# Patient Record
Sex: Male | Born: 1935 | Race: White | Hispanic: No | Smoking: Former smoker
Health system: Southern US, Community
[De-identification: ages and names within clinical notes are randomized; demographics above are authoritative.]

## PROBLEM LIST (undated history)

## (undated) DIAGNOSIS — I4891 Unspecified atrial fibrillation: Secondary | ICD-10-CM

## (undated) DIAGNOSIS — I10 Essential (primary) hypertension: Secondary | ICD-10-CM

## (undated) DIAGNOSIS — C4490 Unspecified malignant neoplasm of skin, unspecified: Secondary | ICD-10-CM

## (undated) HISTORY — DX: Unspecified malignant neoplasm of skin, unspecified: C44.90

## (undated) HISTORY — DX: Unspecified atrial fibrillation: I48.91

## (undated) HISTORY — PX: OTHER SURGICAL HISTORY: SHX169

## (undated) HISTORY — DX: Essential (primary) hypertension: I10

---

## 2011-10-16 DIAGNOSIS — R109 Unspecified abdominal pain: Secondary | ICD-10-CM | POA: Diagnosis not present

## 2011-10-16 DIAGNOSIS — J069 Acute upper respiratory infection, unspecified: Secondary | ICD-10-CM | POA: Diagnosis not present

## 2011-10-22 DIAGNOSIS — R1012 Left upper quadrant pain: Secondary | ICD-10-CM | POA: Diagnosis not present

## 2011-11-05 DIAGNOSIS — R1012 Left upper quadrant pain: Secondary | ICD-10-CM | POA: Diagnosis not present

## 2011-11-05 DIAGNOSIS — R109 Unspecified abdominal pain: Secondary | ICD-10-CM | POA: Diagnosis not present

## 2011-11-11 DIAGNOSIS — R1012 Left upper quadrant pain: Secondary | ICD-10-CM | POA: Diagnosis not present

## 2011-12-01 DIAGNOSIS — R1012 Left upper quadrant pain: Secondary | ICD-10-CM | POA: Diagnosis not present

## 2012-01-06 DIAGNOSIS — I4430 Unspecified atrioventricular block: Secondary | ICD-10-CM | POA: Diagnosis not present

## 2012-01-06 DIAGNOSIS — R609 Edema, unspecified: Secondary | ICD-10-CM | POA: Diagnosis not present

## 2012-01-06 DIAGNOSIS — I10 Essential (primary) hypertension: Secondary | ICD-10-CM | POA: Diagnosis not present

## 2012-01-06 DIAGNOSIS — I4891 Unspecified atrial fibrillation: Secondary | ICD-10-CM | POA: Diagnosis not present

## 2012-01-06 DIAGNOSIS — R42 Dizziness and giddiness: Secondary | ICD-10-CM | POA: Diagnosis not present

## 2012-01-06 DIAGNOSIS — I2510 Atherosclerotic heart disease of native coronary artery without angina pectoris: Secondary | ICD-10-CM | POA: Diagnosis not present

## 2012-01-06 DIAGNOSIS — E785 Hyperlipidemia, unspecified: Secondary | ICD-10-CM | POA: Diagnosis not present

## 2012-01-25 DIAGNOSIS — H908 Mixed conductive and sensorineural hearing loss, unspecified: Secondary | ICD-10-CM | POA: Diagnosis not present

## 2012-02-23 DIAGNOSIS — I4891 Unspecified atrial fibrillation: Secondary | ICD-10-CM | POA: Diagnosis not present

## 2012-03-30 DIAGNOSIS — I4891 Unspecified atrial fibrillation: Secondary | ICD-10-CM | POA: Diagnosis not present

## 2012-04-28 DIAGNOSIS — I4891 Unspecified atrial fibrillation: Secondary | ICD-10-CM | POA: Diagnosis not present

## 2012-05-03 DIAGNOSIS — I10 Essential (primary) hypertension: Secondary | ICD-10-CM | POA: Diagnosis not present

## 2012-05-03 DIAGNOSIS — I4891 Unspecified atrial fibrillation: Secondary | ICD-10-CM | POA: Diagnosis not present

## 2012-05-18 DIAGNOSIS — I4891 Unspecified atrial fibrillation: Secondary | ICD-10-CM | POA: Diagnosis not present

## 2012-06-02 DIAGNOSIS — Z961 Presence of intraocular lens: Secondary | ICD-10-CM | POA: Diagnosis not present

## 2012-06-02 DIAGNOSIS — H35369 Drusen (degenerative) of macula, unspecified eye: Secondary | ICD-10-CM | POA: Diagnosis not present

## 2012-06-02 DIAGNOSIS — H353190 Nonexudative age-related macular degeneration, unspecified eye, stage unspecified: Secondary | ICD-10-CM | POA: Diagnosis not present

## 2012-06-13 DIAGNOSIS — I4891 Unspecified atrial fibrillation: Secondary | ICD-10-CM | POA: Diagnosis not present

## 2012-07-14 DIAGNOSIS — I10 Essential (primary) hypertension: Secondary | ICD-10-CM | POA: Diagnosis not present

## 2012-07-14 DIAGNOSIS — I2510 Atherosclerotic heart disease of native coronary artery without angina pectoris: Secondary | ICD-10-CM | POA: Diagnosis not present

## 2012-07-14 DIAGNOSIS — R42 Dizziness and giddiness: Secondary | ICD-10-CM | POA: Diagnosis not present

## 2012-07-14 DIAGNOSIS — I4430 Unspecified atrioventricular block: Secondary | ICD-10-CM | POA: Diagnosis not present

## 2012-07-14 DIAGNOSIS — E785 Hyperlipidemia, unspecified: Secondary | ICD-10-CM | POA: Diagnosis not present

## 2012-07-14 DIAGNOSIS — R609 Edema, unspecified: Secondary | ICD-10-CM | POA: Diagnosis not present

## 2012-07-14 DIAGNOSIS — I4891 Unspecified atrial fibrillation: Secondary | ICD-10-CM | POA: Diagnosis not present

## 2012-08-17 DIAGNOSIS — I4891 Unspecified atrial fibrillation: Secondary | ICD-10-CM | POA: Diagnosis not present

## 2012-09-13 DIAGNOSIS — I4891 Unspecified atrial fibrillation: Secondary | ICD-10-CM | POA: Diagnosis not present

## 2012-10-12 DIAGNOSIS — I4891 Unspecified atrial fibrillation: Secondary | ICD-10-CM | POA: Diagnosis not present

## 2012-11-16 DIAGNOSIS — E785 Hyperlipidemia, unspecified: Secondary | ICD-10-CM | POA: Diagnosis not present

## 2012-11-16 DIAGNOSIS — I4430 Unspecified atrioventricular block: Secondary | ICD-10-CM | POA: Diagnosis not present

## 2012-11-16 DIAGNOSIS — R609 Edema, unspecified: Secondary | ICD-10-CM | POA: Diagnosis not present

## 2012-11-16 DIAGNOSIS — I2510 Atherosclerotic heart disease of native coronary artery without angina pectoris: Secondary | ICD-10-CM | POA: Diagnosis not present

## 2012-11-16 DIAGNOSIS — I4891 Unspecified atrial fibrillation: Secondary | ICD-10-CM | POA: Diagnosis not present

## 2012-11-16 DIAGNOSIS — I10 Essential (primary) hypertension: Secondary | ICD-10-CM | POA: Diagnosis not present

## 2012-11-16 DIAGNOSIS — R42 Dizziness and giddiness: Secondary | ICD-10-CM | POA: Diagnosis not present

## 2012-12-14 DIAGNOSIS — I4891 Unspecified atrial fibrillation: Secondary | ICD-10-CM | POA: Diagnosis not present

## 2013-01-18 DIAGNOSIS — I4891 Unspecified atrial fibrillation: Secondary | ICD-10-CM | POA: Diagnosis not present

## 2013-01-27 DIAGNOSIS — I428 Other cardiomyopathies: Secondary | ICD-10-CM | POA: Diagnosis not present

## 2013-01-27 DIAGNOSIS — R0789 Other chest pain: Secondary | ICD-10-CM | POA: Diagnosis not present

## 2013-01-27 DIAGNOSIS — M109 Gout, unspecified: Secondary | ICD-10-CM | POA: Diagnosis not present

## 2013-01-27 DIAGNOSIS — I4891 Unspecified atrial fibrillation: Secondary | ICD-10-CM | POA: Diagnosis not present

## 2013-01-27 DIAGNOSIS — I10 Essential (primary) hypertension: Secondary | ICD-10-CM | POA: Diagnosis not present

## 2013-01-27 DIAGNOSIS — I509 Heart failure, unspecified: Secondary | ICD-10-CM | POA: Diagnosis not present

## 2013-01-27 DIAGNOSIS — I209 Angina pectoris, unspecified: Secondary | ICD-10-CM | POA: Diagnosis not present

## 2013-01-27 DIAGNOSIS — R079 Chest pain, unspecified: Secondary | ICD-10-CM | POA: Diagnosis not present

## 2013-01-28 DIAGNOSIS — I4891 Unspecified atrial fibrillation: Secondary | ICD-10-CM | POA: Diagnosis not present

## 2013-01-28 DIAGNOSIS — I10 Essential (primary) hypertension: Secondary | ICD-10-CM | POA: Diagnosis not present

## 2013-01-28 DIAGNOSIS — I209 Angina pectoris, unspecified: Secondary | ICD-10-CM | POA: Diagnosis not present

## 2013-01-28 DIAGNOSIS — R079 Chest pain, unspecified: Secondary | ICD-10-CM | POA: Diagnosis not present

## 2013-01-28 DIAGNOSIS — I509 Heart failure, unspecified: Secondary | ICD-10-CM | POA: Diagnosis not present

## 2013-01-28 DIAGNOSIS — I428 Other cardiomyopathies: Secondary | ICD-10-CM | POA: Diagnosis not present

## 2013-02-21 DIAGNOSIS — L821 Other seborrheic keratosis: Secondary | ICD-10-CM | POA: Diagnosis not present

## 2013-02-21 DIAGNOSIS — L570 Actinic keratosis: Secondary | ICD-10-CM | POA: Diagnosis not present

## 2013-02-21 DIAGNOSIS — L723 Sebaceous cyst: Secondary | ICD-10-CM | POA: Diagnosis not present

## 2013-02-22 DIAGNOSIS — I4891 Unspecified atrial fibrillation: Secondary | ICD-10-CM | POA: Diagnosis not present

## 2013-05-03 DIAGNOSIS — H04129 Dry eye syndrome of unspecified lacrimal gland: Secondary | ICD-10-CM | POA: Diagnosis not present

## 2013-05-03 DIAGNOSIS — Z961 Presence of intraocular lens: Secondary | ICD-10-CM | POA: Diagnosis not present

## 2013-06-08 DIAGNOSIS — D485 Neoplasm of uncertain behavior of skin: Secondary | ICD-10-CM | POA: Diagnosis not present

## 2013-06-08 DIAGNOSIS — L570 Actinic keratosis: Secondary | ICD-10-CM | POA: Diagnosis not present

## 2013-06-08 DIAGNOSIS — Z85828 Personal history of other malignant neoplasm of skin: Secondary | ICD-10-CM | POA: Diagnosis not present

## 2013-06-09 DIAGNOSIS — C44529 Squamous cell carcinoma of skin of other part of trunk: Secondary | ICD-10-CM | POA: Diagnosis not present

## 2013-06-15 DIAGNOSIS — I4891 Unspecified atrial fibrillation: Secondary | ICD-10-CM | POA: Diagnosis not present

## 2013-06-15 DIAGNOSIS — M159 Polyosteoarthritis, unspecified: Secondary | ICD-10-CM | POA: Diagnosis not present

## 2013-06-15 DIAGNOSIS — M1A00X0 Idiopathic chronic gout, unspecified site, without tophus (tophi): Secondary | ICD-10-CM | POA: Diagnosis not present

## 2013-06-15 DIAGNOSIS — I10 Essential (primary) hypertension: Secondary | ICD-10-CM | POA: Diagnosis not present

## 2013-06-15 DIAGNOSIS — Z7901 Long term (current) use of anticoagulants: Secondary | ICD-10-CM | POA: Diagnosis not present

## 2013-06-22 ENCOUNTER — Ambulatory Visit (INDEPENDENT_AMBULATORY_CARE_PROVIDER_SITE_OTHER): Payer: Medicare Other | Admitting: Cardiology

## 2013-06-22 ENCOUNTER — Encounter: Payer: Self-pay | Admitting: Cardiology

## 2013-06-22 VITALS — BP 128/62 | HR 84 | Ht 66.5 in | Wt 149.8 lb

## 2013-06-22 DIAGNOSIS — I4891 Unspecified atrial fibrillation: Secondary | ICD-10-CM

## 2013-06-22 DIAGNOSIS — I10 Essential (primary) hypertension: Secondary | ICD-10-CM

## 2013-06-22 LAB — CBC WITH DIFFERENTIAL/PLATELET
Basophils Absolute: 0 10*3/uL (ref 0.0–0.1)
Eosinophils Absolute: 0.2 10*3/uL (ref 0.0–0.7)
Lymphocytes Relative: 20.3 % (ref 12.0–46.0)
MCHC: 34 g/dL (ref 30.0–36.0)
Neutro Abs: 5 10*3/uL (ref 1.4–7.7)
Neutrophils Relative %: 69.9 % (ref 43.0–77.0)
Platelets: 136 10*3/uL — ABNORMAL LOW (ref 150.0–400.0)
RDW: 14.5 % (ref 11.5–14.6)

## 2013-06-22 LAB — BASIC METABOLIC PANEL
Chloride: 108 mEq/L (ref 96–112)
Creatinine, Ser: 1.2 mg/dL (ref 0.4–1.5)
Sodium: 140 mEq/L (ref 135–145)

## 2013-06-22 MED ORDER — DILTIAZEM HCL ER COATED BEADS 120 MG PO CP24: 120.0000 mg | ORAL_CAPSULE | Freq: Every day | ORAL | Status: DC

## 2013-06-22 NOTE — Assessment & Plan Note (Signed)
Patient appears to have permanent atrial fibrillation. We will obtain all records from [place]. Recent data suggest digoxin is not appropriate for rate control. I will discontinue this medication as well as Norvasc. Begin Cardizem CD 120 mg daily. In 2 weeks we will schedule a 24-hour Holter monitor to make sure that his rate is controlled. Continue Coumadin. Referred to the Coumadin clinic. Check hemoglobin and renal function. He has no history of valvular disease. Once I have reviewed outside records we will make a decision and probably switch from Coumadin to apixaban. Embolic risk factors of age greater than 61 and hypertension.

## 2013-06-22 NOTE — Patient Instructions (Signed)
Your physician recommends that you schedule a follow-up appointment in: 8-12 WEEKS WITH DR CRENSHAW  STOP AMLODIPINE  STOP DIGOXIN  3 DAYS AFTER STOPPING DIGOXIN START DILTIAZEM 120 MG ONCE DAILY  Your physician has recommended that you wear a 24 HOUR holter monitor. Holter monitors are medical devices that record the heart's electrical activity. Doctors most often use these monitors to diagnose arrhythmias. Arrhythmias are problems with the speed or rhythm of the heartbeat. The monitor is a small, portable device. You can wear one while you do your normal daily activities. This is usually used to diagnose what is causing palpitations/syncope (passing out).  Your physician recommends that you HAVE LAB WORK TODAY  NEEDS TO GET ESTABLISHED WITH THE CVRR CLINIC

## 2013-06-22 NOTE — Assessment & Plan Note (Signed)
Blood pressure controlled. Medicine changes as outlined under atrial fibrillation.

## 2013-06-22 NOTE — Progress Notes (Signed)
  HPI: 79 yo male for evaluation of atrial fibrillation. No records are available. He previously resided in [place]. He has been told of an irregular heart beats for approximately 6 years and has been maintained on Coumadin and digoxin. He denies dyspnea, chest pain, palpitations, syncope or bleeding.  Current Outpatient Prescriptions  Medication Sig Dispense Refill  . allopurinol (ZYLOPRIM) 100 MG tablet Take 100 mg by mouth daily.      Marland Kitchen amLODipine (NORVASC) 5 MG tablet Take 5 mg by mouth daily.      . benazepril (LOTENSIN) 40 MG tablet Take 40 mg by mouth daily.      . digoxin (LANOXIN) 0.125 MG tablet Take 0.125 mg by mouth daily.      Marland Kitchen warfarin (COUMADIN) 5 MG tablet Take 5 mg by mouth daily. 7.5mg  daily except Sun, Wed 5mg        No current facility-administered medications for this visit.    Allergies  Allergen Reactions  . Penicillins Rash     Past Medical History  Diagnosis Date  . Hypertension   . Atrial fibrillation   . Skin cancer     Past Surgical History  Procedure Laterality Date  . Cataracts      History   Social History  . Marital Status: Widowed    Spouse Name: N/A    Number of Children: 3  . Years of Education: N/A   Occupational History  . Not on file.   Social History Main Topics  . Smoking status: Former Smoker -- 3.00 packs/day    Types: Cigarettes    Start date: 06/22/1949    Quit date: 06/22/1969  . Smokeless tobacco: Not on file  . Alcohol Use: Yes     Comment: Occasional  . Drug Use: Not on file  . Sexual Activity: Not on file   Other Topics Concern  . Not on file   Social History Narrative  . No narrative on file    Family History  Problem Relation Age of Onset  . Heart disease      No family history    ROS: no fevers or chills, productive cough, hemoptysis, dysphasia, odynophagia, melena, hematochezia, dysuria, hematuria, rash, seizure activity, orthopnea, PND, pedal edema, claudication. Remaining systems are  negative.  Physical Exam:   Blood pressure 128/62, pulse 84, height 5' 6.5" (1.689 m), weight 149 lb 12.8 oz (67.949 kg).  General:  Well developed/well nourished in NAD Skin warm/dry Patient not depressed No peripheral clubbing Back-normal HEENT-normal/normal eyelids Neck supple/normal carotid upstroke bilaterally; no bruits; no JVD; no thyromegaly chest - CTA/ normal expansion CV - irregular/normal S1 and S2; no murmurs, rubs or gallops;  PMI nondisplaced Abdomen -NT/ND, no HSM, no mass, + bowel sounds, no bruit 2+ femoral pulses, no bruits Ext-no edema, chords, 2+ DP Neuro-grossly nonfocal  ECG atrial fibrillation at a rate of 84. Cannot rule out prior septal infarct. Nonspecific ST changes.

## 2013-06-23 DIAGNOSIS — C44529 Squamous cell carcinoma of skin of other part of trunk: Secondary | ICD-10-CM | POA: Diagnosis not present

## 2013-06-23 DIAGNOSIS — D045 Carcinoma in situ of skin of trunk: Secondary | ICD-10-CM | POA: Diagnosis not present

## 2013-06-23 DIAGNOSIS — L905 Scar conditions and fibrosis of skin: Secondary | ICD-10-CM | POA: Diagnosis not present

## 2013-06-28 ENCOUNTER — Telehealth: Payer: Self-pay | Admitting: Cardiology

## 2013-06-28 NOTE — Telephone Encounter (Signed)
New Problem:  Pt states he got a new bottle of meds in the mail. However, the pt states he thought his meds were going to be changed. Pt would like clarification on his meds.

## 2013-06-28 NOTE — Telephone Encounter (Signed)
Left message for pt to call.

## 2013-07-05 NOTE — Telephone Encounter (Signed)
Left message for dtr-n-law to call

## 2013-07-05 NOTE — Telephone Encounter (Signed)
F/u   [place] returning your call.

## 2013-07-05 NOTE — Telephone Encounter (Signed)
New Problem:  Pt's daughter n law states the pt's medicine recently changed. The pt was suppose to stop 2 of his meds and start taking one. However the pt has been taking all 3, according to his daughter n law. Please advise

## 2013-07-05 NOTE — Telephone Encounter (Signed)
Left message for pt to call.

## 2013-07-06 ENCOUNTER — Encounter: Payer: Self-pay | Admitting: Radiology

## 2013-07-06 ENCOUNTER — Ambulatory Visit (INDEPENDENT_AMBULATORY_CARE_PROVIDER_SITE_OTHER): Payer: Medicare Other | Admitting: General Practice

## 2013-07-06 ENCOUNTER — Ambulatory Visit (INDEPENDENT_AMBULATORY_CARE_PROVIDER_SITE_OTHER): Payer: Medicare Other | Admitting: Radiology

## 2013-07-06 DIAGNOSIS — I10 Essential (primary) hypertension: Secondary | ICD-10-CM

## 2013-07-06 DIAGNOSIS — I4891 Unspecified atrial fibrillation: Secondary | ICD-10-CM

## 2013-07-06 DIAGNOSIS — Z7901 Long term (current) use of anticoagulants: Secondary | ICD-10-CM | POA: Diagnosis not present

## 2013-07-06 LAB — POCT INR: INR: 2.1

## 2013-07-06 NOTE — Progress Notes (Signed)
Patient ID: Peter Moreno, male   DOB: [birth year], 79 y.o.   MRN: [id number] E Cardio 24 Holter Monitor applied

## 2013-07-06 NOTE — Telephone Encounter (Signed)
Pt and dtr-n-law here for coumadin appt. The pt got confused and has been taking the amlodipine, digoxin and cardizem all together. Per dr Jens Som last office note, pt instructed to stop the amlodipine and digoxin. He will continue the cardizem. Also dr Jens Som had reviewed the pt records from [place] and was wanting to switch the pt from warfarin to eliquis. Discussed with the pt, he is not interested in changing. He will continue with coumaqdin and his INR's will be followed here in our CVRR clinic. Dr Jens Som made aware.

## 2013-07-06 NOTE — Progress Notes (Signed)
Patient ID: Peter Moreno, male   DOB: [birth year], 79 y.o.   MRN: [id number] E Cardio 24 Holter monitor applied

## 2013-07-07 DIAGNOSIS — H6120 Impacted cerumen, unspecified ear: Secondary | ICD-10-CM | POA: Diagnosis not present

## 2013-07-14 ENCOUNTER — Telehealth: Payer: Self-pay | Admitting: *Deleted

## 2013-07-14 NOTE — Telephone Encounter (Signed)
Spoke with pt, aware monitor reviewed by dr Jens Som show atrial fib with controlled rate.

## 2013-08-03 ENCOUNTER — Ambulatory Visit (INDEPENDENT_AMBULATORY_CARE_PROVIDER_SITE_OTHER): Payer: Medicare Other | Admitting: General Practice

## 2013-08-03 DIAGNOSIS — Z7901 Long term (current) use of anticoagulants: Secondary | ICD-10-CM | POA: Diagnosis not present

## 2013-08-03 DIAGNOSIS — I4891 Unspecified atrial fibrillation: Secondary | ICD-10-CM | POA: Diagnosis not present

## 2013-09-07 ENCOUNTER — Ambulatory Visit (INDEPENDENT_AMBULATORY_CARE_PROVIDER_SITE_OTHER): Payer: Medicare Other | Admitting: General Practice

## 2013-09-07 DIAGNOSIS — I4891 Unspecified atrial fibrillation: Secondary | ICD-10-CM

## 2013-09-07 DIAGNOSIS — Z5181 Encounter for therapeutic drug level monitoring: Secondary | ICD-10-CM | POA: Diagnosis not present

## 2013-09-07 DIAGNOSIS — Z7901 Long term (current) use of anticoagulants: Secondary | ICD-10-CM

## 2013-09-18 ENCOUNTER — Encounter: Payer: Self-pay | Admitting: Cardiology

## 2013-09-19 ENCOUNTER — Encounter: Payer: Self-pay | Admitting: Cardiology

## 2013-09-19 ENCOUNTER — Encounter (INDEPENDENT_AMBULATORY_CARE_PROVIDER_SITE_OTHER): Payer: Self-pay

## 2013-09-19 ENCOUNTER — Ambulatory Visit (INDEPENDENT_AMBULATORY_CARE_PROVIDER_SITE_OTHER): Payer: Medicare Other | Admitting: Pharmacist

## 2013-09-19 ENCOUNTER — Ambulatory Visit (INDEPENDENT_AMBULATORY_CARE_PROVIDER_SITE_OTHER): Payer: Medicare Other | Admitting: Cardiology

## 2013-09-19 VITALS — BP 118/75 | HR 90 | Ht 68.0 in | Wt 152.0 lb

## 2013-09-19 DIAGNOSIS — I4891 Unspecified atrial fibrillation: Secondary | ICD-10-CM

## 2013-09-19 DIAGNOSIS — Z7901 Long term (current) use of anticoagulants: Secondary | ICD-10-CM

## 2013-09-19 MED ORDER — BENAZEPRIL HCL 20 MG PO TABS: 20.0000 mg | ORAL_TABLET | Freq: Every day | ORAL | Status: DC

## 2013-09-19 NOTE — Patient Instructions (Signed)
Please decrease your Lotensin to 20 mg a day, Continue all other medications as listed.  Follow up in 6 months with Dr Jens Som.  You will receive a letter in the mail 2 months before you are due.  Please call us when you receive this letter to schedule your follow up appointment.

## 2013-09-19 NOTE — Progress Notes (Signed)
      HPI: FU atrial fibrillation. Nuclear study in April of 2014 showed an ejection fraction of 84% and no ischemia or infarction. Echocardiogram in April of 2014 showed hyperdynamic LV function, biatrial enlargement, mild right ventricular enlargement, mild aortic insufficiency, mild to moderate mitral regurgitation and moderate tricuspid regurgitation. When I initially saw him in September of 2014 we discontinued digoxin and begin Cardizem. Followup holter monitor in October 2014 showed atrial fibrillation with controlled ventricular response. Since I saw him, the patient denies any dyspnea on exertion, orthopnea, PND, pedal edema, palpitations, syncope or chest pain.    Current Outpatient Prescriptions  Medication Sig Dispense Refill  . allopurinol (ZYLOPRIM) 100 MG tablet Take 100 mg by mouth daily.      . benazepril (LOTENSIN) 40 MG tablet Take 40 mg by mouth daily.      Marland Kitchen diltiazem (CARDIZEM CD) 120 MG 24 hr capsule Take 1 capsule (120 mg total) by mouth daily.  90 capsule  3  . warfarin (COUMADIN) 5 MG tablet Take 5 mg by mouth daily. 7.5mg  daily except Sun, Wed 5mg        No current facility-administered medications for this visit.     Past Medical History  Diagnosis Date  . Hypertension   . Atrial fibrillation   . Skin cancer     Past Surgical History  Procedure Laterality Date  . Cataracts      History   Social History  . Marital Status: Widowed    Spouse Name: N/A    Number of Children: 3  . Years of Education: N/A   Occupational History  . Not on file.   Social History Main Topics  . Smoking status: Former Smoker -- 3.00 packs/day    Types: Cigarettes    Start date: 06/22/1949    Quit date: 06/22/1969  . Smokeless tobacco: Not on file  . Alcohol Use: Yes     Comment: Occasional  . Drug Use: Not on file  . Sexual Activity: Not on file   Other Topics Concern  . Not on file   Social History Narrative  . No narrative on file    ROS: no fevers or  chills, productive cough, hemoptysis, dysphasia, odynophagia, melena, hematochezia, dysuria, hematuria, rash, seizure activity, orthopnea, PND, pedal edema, claudication. Remaining systems are negative.  Physical Exam: Well-developed well-nourished in no acute distress.  Skin is warm and dry.  HEENT is normal.  Neck is supple.  Chest is clear to auscultation with normal expansion.  Cardiovascular exam is irregular Abdominal exam nontender or distended. No masses palpated. Extremities show no edema. neuro grossly intact  ECG atrial fibrillation at a rate of 90. Right axis deviation. Cannot rule out prior septal infarct.

## 2013-09-19 NOTE — Assessment & Plan Note (Signed)
Patient remains in permanent atrial fibrillation. His rate is controlled. Continue Cardizem. Continue Coumadin. We discussed NOAC and he prefers Coumadin.

## 2013-09-19 NOTE — Assessment & Plan Note (Signed)
Patient has occasional mild dizziness with standing. His blood pressure is borderline. Decrease Lotensin to 20 mg daily.

## 2013-10-17 ENCOUNTER — Ambulatory Visit (INDEPENDENT_AMBULATORY_CARE_PROVIDER_SITE_OTHER): Payer: Medicare Other

## 2013-10-17 DIAGNOSIS — Z5181 Encounter for therapeutic drug level monitoring: Secondary | ICD-10-CM

## 2013-10-17 DIAGNOSIS — Z7901 Long term (current) use of anticoagulants: Secondary | ICD-10-CM | POA: Diagnosis not present

## 2013-10-17 DIAGNOSIS — I4891 Unspecified atrial fibrillation: Secondary | ICD-10-CM

## 2013-10-17 LAB — POCT INR: INR: 2.7

## 2013-10-29 DIAGNOSIS — R109 Unspecified abdominal pain: Secondary | ICD-10-CM | POA: Diagnosis not present

## 2013-10-29 DIAGNOSIS — Z7901 Long term (current) use of anticoagulants: Secondary | ICD-10-CM | POA: Diagnosis not present

## 2013-10-29 DIAGNOSIS — K5900 Constipation, unspecified: Secondary | ICD-10-CM | POA: Diagnosis not present

## 2013-11-14 ENCOUNTER — Ambulatory Visit (INDEPENDENT_AMBULATORY_CARE_PROVIDER_SITE_OTHER): Payer: Medicare Other

## 2013-11-14 DIAGNOSIS — Z7901 Long term (current) use of anticoagulants: Secondary | ICD-10-CM

## 2013-11-14 DIAGNOSIS — Z5181 Encounter for therapeutic drug level monitoring: Secondary | ICD-10-CM | POA: Diagnosis not present

## 2013-11-14 DIAGNOSIS — I4891 Unspecified atrial fibrillation: Secondary | ICD-10-CM | POA: Diagnosis not present

## 2013-11-14 LAB — POCT INR: INR: 2.1

## 2013-11-17 ENCOUNTER — Other Ambulatory Visit: Payer: Self-pay | Admitting: *Deleted

## 2013-11-17 MED ORDER — WARFARIN SODIUM 5 MG PO TABS: ORAL_TABLET | ORAL | Status: DC

## 2013-12-12 ENCOUNTER — Ambulatory Visit (INDEPENDENT_AMBULATORY_CARE_PROVIDER_SITE_OTHER): Payer: Medicare Other

## 2013-12-12 ENCOUNTER — Telehealth: Payer: Self-pay

## 2013-12-12 DIAGNOSIS — I4891 Unspecified atrial fibrillation: Secondary | ICD-10-CM

## 2013-12-12 DIAGNOSIS — Z5181 Encounter for therapeutic drug level monitoring: Secondary | ICD-10-CM | POA: Diagnosis not present

## 2013-12-12 DIAGNOSIS — Z7901 Long term (current) use of anticoagulants: Secondary | ICD-10-CM

## 2013-12-12 LAB — POCT INR: INR: 3.2

## 2013-12-12 NOTE — Telephone Encounter (Signed)
Pt seen in Coumadin clinic today states his rx for Diltiazem CD 120mg  QD capsules is running low called [facility] to inquire about why they hadn't sent refill, and they state the rx is no longer being filled there and they are sending all scripts to [place]?  Pt wants to know if he should continue on this drug, what he should do about obtaining a rx.

## 2013-12-13 NOTE — Telephone Encounter (Signed)
Family member to give pt message,  we can write out script for diltiazem and then he is responsible for mailing to [place].

## 2013-12-14 ENCOUNTER — Other Ambulatory Visit: Payer: Self-pay | Admitting: *Deleted

## 2013-12-14 DIAGNOSIS — I10 Essential (primary) hypertension: Secondary | ICD-10-CM

## 2013-12-14 DIAGNOSIS — I4891 Unspecified atrial fibrillation: Secondary | ICD-10-CM

## 2013-12-14 MED ORDER — DILTIAZEM HCL ER COATED BEADS 120 MG PO CP24: 120.0000 mg | ORAL_CAPSULE | Freq: Every day | ORAL | Status: DC

## 2014-01-09 ENCOUNTER — Ambulatory Visit (INDEPENDENT_AMBULATORY_CARE_PROVIDER_SITE_OTHER): Payer: Medicare Other | Admitting: Pharmacist

## 2014-01-09 DIAGNOSIS — Z5181 Encounter for therapeutic drug level monitoring: Secondary | ICD-10-CM

## 2014-01-09 DIAGNOSIS — I4891 Unspecified atrial fibrillation: Secondary | ICD-10-CM

## 2014-01-09 DIAGNOSIS — Z7901 Long term (current) use of anticoagulants: Secondary | ICD-10-CM

## 2014-01-09 LAB — POCT INR: INR: 2.7

## 2014-02-06 ENCOUNTER — Ambulatory Visit (INDEPENDENT_AMBULATORY_CARE_PROVIDER_SITE_OTHER): Payer: Medicare Other

## 2014-02-06 DIAGNOSIS — I4891 Unspecified atrial fibrillation: Secondary | ICD-10-CM

## 2014-02-06 DIAGNOSIS — Z7901 Long term (current) use of anticoagulants: Secondary | ICD-10-CM

## 2014-02-06 DIAGNOSIS — Z5181 Encounter for therapeutic drug level monitoring: Secondary | ICD-10-CM

## 2014-02-06 LAB — POCT INR: INR: 3.6

## 2014-02-16 ENCOUNTER — Other Ambulatory Visit: Payer: Self-pay | Admitting: *Deleted

## 2014-02-16 DIAGNOSIS — I4891 Unspecified atrial fibrillation: Secondary | ICD-10-CM

## 2014-02-16 DIAGNOSIS — S92309A Fracture of unspecified metatarsal bone(s), unspecified foot, initial encounter for closed fracture: Secondary | ICD-10-CM | POA: Diagnosis not present

## 2014-02-16 DIAGNOSIS — I10 Essential (primary) hypertension: Secondary | ICD-10-CM

## 2014-02-16 DIAGNOSIS — I509 Heart failure, unspecified: Secondary | ICD-10-CM | POA: Diagnosis not present

## 2014-02-16 DIAGNOSIS — I110 Hypertensive heart disease with heart failure: Secondary | ICD-10-CM | POA: Diagnosis not present

## 2014-02-16 DIAGNOSIS — K5900 Constipation, unspecified: Secondary | ICD-10-CM | POA: Diagnosis not present

## 2014-02-16 MED ORDER — DILTIAZEM HCL ER COATED BEADS 120 MG PO CP24: 120.0000 mg | ORAL_CAPSULE | Freq: Every day | ORAL | Status: DC

## 2014-02-27 ENCOUNTER — Ambulatory Visit (INDEPENDENT_AMBULATORY_CARE_PROVIDER_SITE_OTHER): Payer: Medicare Other | Admitting: *Deleted

## 2014-02-27 DIAGNOSIS — Z5181 Encounter for therapeutic drug level monitoring: Secondary | ICD-10-CM | POA: Diagnosis not present

## 2014-02-27 DIAGNOSIS — Z7901 Long term (current) use of anticoagulants: Secondary | ICD-10-CM | POA: Diagnosis not present

## 2014-02-27 DIAGNOSIS — I4891 Unspecified atrial fibrillation: Secondary | ICD-10-CM

## 2014-02-27 LAB — POCT INR: INR: 3.9

## 2014-03-13 ENCOUNTER — Ambulatory Visit (INDEPENDENT_AMBULATORY_CARE_PROVIDER_SITE_OTHER): Payer: Medicare Other | Admitting: Pharmacist

## 2014-03-13 DIAGNOSIS — Z5181 Encounter for therapeutic drug level monitoring: Secondary | ICD-10-CM | POA: Diagnosis not present

## 2014-03-13 DIAGNOSIS — Z7901 Long term (current) use of anticoagulants: Secondary | ICD-10-CM | POA: Diagnosis not present

## 2014-03-13 DIAGNOSIS — I4891 Unspecified atrial fibrillation: Secondary | ICD-10-CM

## 2014-03-13 LAB — POCT INR: INR: 3.4

## 2014-03-20 ENCOUNTER — Ambulatory Visit: Payer: Medicare Other | Admitting: Cardiology

## 2014-03-27 ENCOUNTER — Ambulatory Visit (INDEPENDENT_AMBULATORY_CARE_PROVIDER_SITE_OTHER): Payer: Medicare Other | Admitting: Pharmacist

## 2014-03-27 DIAGNOSIS — I4891 Unspecified atrial fibrillation: Secondary | ICD-10-CM | POA: Diagnosis not present

## 2014-03-27 DIAGNOSIS — Z5181 Encounter for therapeutic drug level monitoring: Secondary | ICD-10-CM | POA: Diagnosis not present

## 2014-03-27 DIAGNOSIS — Z7901 Long term (current) use of anticoagulants: Secondary | ICD-10-CM

## 2014-03-27 LAB — POCT INR: INR: 2.5

## 2014-04-10 ENCOUNTER — Ambulatory Visit (INDEPENDENT_AMBULATORY_CARE_PROVIDER_SITE_OTHER): Payer: Medicare Other | Admitting: *Deleted

## 2014-04-10 ENCOUNTER — Ambulatory Visit (INDEPENDENT_AMBULATORY_CARE_PROVIDER_SITE_OTHER): Payer: Medicare Other | Admitting: Cardiology

## 2014-04-10 ENCOUNTER — Encounter: Payer: Self-pay | Admitting: Cardiology

## 2014-04-10 VITALS — BP 124/82 | HR 74 | Ht 67.0 in | Wt 157.6 lb

## 2014-04-10 DIAGNOSIS — I4891 Unspecified atrial fibrillation: Secondary | ICD-10-CM

## 2014-04-10 DIAGNOSIS — Z7901 Long term (current) use of anticoagulants: Secondary | ICD-10-CM

## 2014-04-10 DIAGNOSIS — I480 Paroxysmal atrial fibrillation: Secondary | ICD-10-CM

## 2014-04-10 DIAGNOSIS — I10 Essential (primary) hypertension: Secondary | ICD-10-CM | POA: Diagnosis not present

## 2014-04-10 DIAGNOSIS — Z5181 Encounter for therapeutic drug level monitoring: Secondary | ICD-10-CM

## 2014-04-10 LAB — CBC
HEMATOCRIT: 38.8 % — AB (ref 39.0–52.0)
HEMOGLOBIN: 13.3 g/dL (ref 13.0–17.0)
MCH: 32.6 pg (ref 26.0–34.0)
MCHC: 34.3 g/dL (ref 30.0–36.0)
MCV: 95.1 fL (ref 78.0–100.0)
Platelets: 128 10*3/uL — ABNORMAL LOW (ref 150–400)
RBC: 4.08 MIL/uL — ABNORMAL LOW (ref 4.22–5.81)
RDW: 15.9 % — ABNORMAL HIGH (ref 11.5–15.5)
WBC: 6.8 10*3/uL (ref 4.0–10.5)

## 2014-04-10 LAB — POCT INR: INR: 2.8

## 2014-04-10 MED ORDER — WARFARIN SODIUM 5 MG PO TABS: ORAL_TABLET | ORAL | Status: DC

## 2014-04-10 NOTE — Assessment & Plan Note (Signed)
Continue Cardizem. Continue Coumadin. Check hemoglobin. Not interested in NOAC.

## 2014-04-10 NOTE — Assessment & Plan Note (Signed)
Blood pressure controlled. Continue present medications. Check potassium and renal function. 

## 2014-04-10 NOTE — Progress Notes (Signed)
      HPI: FU atrial fibrillation. Nuclear study in April of 2014 showed an ejection fraction of 84% and no ischemia or infarction. Echocardiogram in April of 2014 showed hyperdynamic LV function, biatrial enlargement, mild right ventricular enlargement, mild aortic insufficiency, mild to moderate mitral regurgitation and moderate tricuspid regurgitation. When I initially saw him in September of 2014 we discontinued digoxin and begin Cardizem. Followup holter monitor in October 2014 showed atrial fibrillation with controlled ventricular response. Since I saw him 12/14, the patient denies any dyspnea on exertion, orthopnea, PND, pedal edema, palpitations, syncope or chest pain.   Current Outpatient Prescriptions  Medication Sig Dispense Refill  . allopurinol (ZYLOPRIM) 100 MG tablet Take 100 mg by mouth daily.      . benazepril (LOTENSIN) 20 MG tablet Take 1 tablet (20 mg total) by mouth daily.  90 tablet  3  . diltiazem (CARDIZEM CD) 120 MG 24 hr capsule Take 1 capsule (120 mg total) by mouth daily.  90 capsule  0  . lactulose (CHRONULAC) 10 GM/15ML solution Take 10 g by mouth daily.      Marland Kitchen warfarin (COUMADIN) 5 MG tablet Take as directed by Anticoagulation clinic  45 tablet  1   No current facility-administered medications for this visit.     Past Medical History  Diagnosis Date  . Hypertension   . Atrial fibrillation   . Skin cancer     Past Surgical History  Procedure Laterality Date  . Cataracts      History   Social History  . Marital Status: Widowed    Spouse Name: N/A    Number of Children: 3  . Years of Education: N/A   Occupational History  . Not on file.   Social History Main Topics  . Smoking status: Former Smoker -- 3.00 packs/day    Types: Cigarettes    Start date: 06/22/1949    Quit date: 06/22/1969  . Smokeless tobacco: Not on file  . Alcohol Use: Yes     Comment: Occasional  . Drug Use: Not on file  . Sexual Activity: Not on file   Other Topics  Concern  . Not on file   Social History Narrative  . No narrative on file    ROS: no fevers or chills, productive cough, hemoptysis, dysphasia, odynophagia, melena, hematochezia, dysuria, hematuria, rash, seizure activity, orthopnea, PND, pedal edema, claudication. Remaining systems are negative.  Physical Exam: Well-developed well-nourished in no acute distress.  Skin is warm and dry.  HEENT is normal.  Neck is supple.  Chest is clear to auscultation with normal expansion.  Cardiovascular exam is irregular Abdominal exam nontender or distended. No masses palpated. Extremities show trace edema. neuro grossly intact  ECG Atrial fibrillation at a rate of 74. Normal axis. Prior septal infarct. Nonspecific ST changes.

## 2014-04-10 NOTE — Patient Instructions (Signed)
Your physician wants you to follow-up in: ONE YEAR WITH DR CRENSHAW You will receive a reminder letter in the mail two months in advance. If you don't receive a letter, please call our office to schedule the follow-up appointment.   Your physician recommends that you HAVE LAB WORK TODAY 

## 2014-04-11 ENCOUNTER — Telehealth: Payer: Self-pay | Admitting: Cardiology

## 2014-04-11 LAB — BASIC METABOLIC PANEL WITH GFR
BUN: 24 mg/dL — ABNORMAL HIGH (ref 6–23)
CO2: 26 mEq/L (ref 19–32)
Calcium: 9.4 mg/dL (ref 8.4–10.5)
Chloride: 108 mEq/L (ref 96–112)
Creat: 1.13 mg/dL (ref 0.50–1.35)
GFR, EST NON AFRICAN AMERICAN: 59 mL/min — AB
GFR, Est African American: 68 mL/min
Glucose, Bld: 165 mg/dL — ABNORMAL HIGH (ref 70–99)
Potassium: 4.3 mEq/L (ref 3.5–5.3)
Sodium: 141 mEq/L (ref 135–145)

## 2014-04-11 NOTE — Telephone Encounter (Signed)
Anderson Malta with [facility] is calling to get Clarification on Mr. [name] . Please refer to reference #[id number].Marland Kitchen  Would Like to be called by Friday 04/13/2014..   Thanks

## 2014-05-09 ENCOUNTER — Ambulatory Visit (INDEPENDENT_AMBULATORY_CARE_PROVIDER_SITE_OTHER): Payer: Medicare Other | Admitting: Pharmacist

## 2014-05-09 DIAGNOSIS — I4891 Unspecified atrial fibrillation: Secondary | ICD-10-CM | POA: Diagnosis not present

## 2014-05-09 DIAGNOSIS — Z5181 Encounter for therapeutic drug level monitoring: Secondary | ICD-10-CM

## 2014-05-09 DIAGNOSIS — Z7901 Long term (current) use of anticoagulants: Secondary | ICD-10-CM | POA: Diagnosis not present

## 2014-05-09 LAB — POCT INR: INR: 3.3

## 2014-05-24 ENCOUNTER — Other Ambulatory Visit: Payer: Self-pay

## 2014-05-24 DIAGNOSIS — I480 Paroxysmal atrial fibrillation: Secondary | ICD-10-CM

## 2014-05-24 DIAGNOSIS — I10 Essential (primary) hypertension: Secondary | ICD-10-CM

## 2014-05-24 MED ORDER — WARFARIN SODIUM 5 MG PO TABS: ORAL_TABLET | ORAL | Status: DC

## 2014-06-12 ENCOUNTER — Ambulatory Visit (INDEPENDENT_AMBULATORY_CARE_PROVIDER_SITE_OTHER): Payer: Medicare Other | Admitting: *Deleted

## 2014-06-12 DIAGNOSIS — I4891 Unspecified atrial fibrillation: Secondary | ICD-10-CM | POA: Diagnosis not present

## 2014-06-12 DIAGNOSIS — Z7901 Long term (current) use of anticoagulants: Secondary | ICD-10-CM

## 2014-06-12 DIAGNOSIS — Z5181 Encounter for therapeutic drug level monitoring: Secondary | ICD-10-CM | POA: Diagnosis not present

## 2014-06-12 LAB — POCT INR: INR: 3.2

## 2014-06-27 ENCOUNTER — Other Ambulatory Visit: Payer: Self-pay | Admitting: Cardiology

## 2014-06-28 ENCOUNTER — Ambulatory Visit (INDEPENDENT_AMBULATORY_CARE_PROVIDER_SITE_OTHER): Payer: Medicare Other

## 2014-06-28 DIAGNOSIS — Z7901 Long term (current) use of anticoagulants: Secondary | ICD-10-CM

## 2014-06-28 DIAGNOSIS — Z5181 Encounter for therapeutic drug level monitoring: Secondary | ICD-10-CM | POA: Diagnosis not present

## 2014-06-28 DIAGNOSIS — I4891 Unspecified atrial fibrillation: Secondary | ICD-10-CM

## 2014-06-28 LAB — POCT INR: INR: 2.4

## 2014-07-16 ENCOUNTER — Other Ambulatory Visit: Payer: Self-pay | Admitting: Cardiology

## 2014-07-26 ENCOUNTER — Ambulatory Visit (INDEPENDENT_AMBULATORY_CARE_PROVIDER_SITE_OTHER): Payer: Medicare Other

## 2014-07-26 DIAGNOSIS — Z5181 Encounter for therapeutic drug level monitoring: Secondary | ICD-10-CM

## 2014-07-26 DIAGNOSIS — I4891 Unspecified atrial fibrillation: Secondary | ICD-10-CM | POA: Diagnosis not present

## 2014-07-26 DIAGNOSIS — Z7901 Long term (current) use of anticoagulants: Secondary | ICD-10-CM

## 2014-07-26 LAB — POCT INR: INR: 3.3

## 2014-08-16 ENCOUNTER — Ambulatory Visit (INDEPENDENT_AMBULATORY_CARE_PROVIDER_SITE_OTHER): Payer: Medicare Other | Admitting: *Deleted

## 2014-08-16 DIAGNOSIS — I4891 Unspecified atrial fibrillation: Secondary | ICD-10-CM | POA: Diagnosis not present

## 2014-08-16 DIAGNOSIS — Z7901 Long term (current) use of anticoagulants: Secondary | ICD-10-CM | POA: Diagnosis not present

## 2014-08-16 DIAGNOSIS — Z5181 Encounter for therapeutic drug level monitoring: Secondary | ICD-10-CM

## 2014-08-16 LAB — POCT INR: INR: 2.1

## 2014-09-06 ENCOUNTER — Ambulatory Visit (INDEPENDENT_AMBULATORY_CARE_PROVIDER_SITE_OTHER): Payer: Medicare Other | Admitting: Pharmacist Clinician (PhC)/ Clinical Pharmacy Specialist

## 2014-09-06 DIAGNOSIS — I4891 Unspecified atrial fibrillation: Secondary | ICD-10-CM | POA: Diagnosis not present

## 2014-09-06 DIAGNOSIS — Z7901 Long term (current) use of anticoagulants: Secondary | ICD-10-CM

## 2014-09-06 DIAGNOSIS — Z5181 Encounter for therapeutic drug level monitoring: Secondary | ICD-10-CM | POA: Diagnosis not present

## 2014-09-06 LAB — POCT INR: INR: 2.8

## 2014-10-04 ENCOUNTER — Ambulatory Visit (INDEPENDENT_AMBULATORY_CARE_PROVIDER_SITE_OTHER): Payer: Medicare Other | Admitting: Pharmacist Clinician (PhC)/ Clinical Pharmacy Specialist

## 2014-10-04 DIAGNOSIS — Z7901 Long term (current) use of anticoagulants: Secondary | ICD-10-CM

## 2014-10-04 DIAGNOSIS — Z5181 Encounter for therapeutic drug level monitoring: Secondary | ICD-10-CM | POA: Diagnosis not present

## 2014-10-04 DIAGNOSIS — I4891 Unspecified atrial fibrillation: Secondary | ICD-10-CM

## 2014-10-04 LAB — POCT INR: INR: 2.5

## 2014-10-14 ENCOUNTER — Other Ambulatory Visit: Payer: Self-pay | Admitting: Cardiology

## 2014-11-01 ENCOUNTER — Ambulatory Visit (INDEPENDENT_AMBULATORY_CARE_PROVIDER_SITE_OTHER): Payer: Medicare Other | Admitting: Pharmacist Clinician (PhC)/ Clinical Pharmacy Specialist

## 2014-11-01 DIAGNOSIS — Z7901 Long term (current) use of anticoagulants: Secondary | ICD-10-CM

## 2014-11-01 DIAGNOSIS — Z5181 Encounter for therapeutic drug level monitoring: Secondary | ICD-10-CM

## 2014-11-01 DIAGNOSIS — I4891 Unspecified atrial fibrillation: Secondary | ICD-10-CM

## 2014-11-01 LAB — POCT INR: INR: 2.4

## 2014-11-02 DIAGNOSIS — Z0000 Encounter for general adult medical examination without abnormal findings: Secondary | ICD-10-CM | POA: Diagnosis not present

## 2014-11-02 DIAGNOSIS — I482 Chronic atrial fibrillation: Secondary | ICD-10-CM | POA: Diagnosis not present

## 2014-11-02 DIAGNOSIS — I110 Hypertensive heart disease with heart failure: Secondary | ICD-10-CM | POA: Diagnosis not present

## 2014-11-02 DIAGNOSIS — M109 Gout, unspecified: Secondary | ICD-10-CM | POA: Diagnosis not present

## 2014-11-16 ENCOUNTER — Other Ambulatory Visit: Payer: Self-pay | Admitting: Cardiology

## 2014-12-18 ENCOUNTER — Ambulatory Visit (INDEPENDENT_AMBULATORY_CARE_PROVIDER_SITE_OTHER): Payer: Medicare Other | Admitting: *Deleted

## 2014-12-18 DIAGNOSIS — I4891 Unspecified atrial fibrillation: Secondary | ICD-10-CM | POA: Diagnosis not present

## 2014-12-18 DIAGNOSIS — Z5181 Encounter for therapeutic drug level monitoring: Secondary | ICD-10-CM | POA: Diagnosis not present

## 2014-12-18 DIAGNOSIS — Z7901 Long term (current) use of anticoagulants: Secondary | ICD-10-CM

## 2014-12-18 LAB — POCT INR: INR: 2.3

## 2015-01-29 ENCOUNTER — Ambulatory Visit (INDEPENDENT_AMBULATORY_CARE_PROVIDER_SITE_OTHER): Payer: Medicare Other

## 2015-01-29 DIAGNOSIS — Z5181 Encounter for therapeutic drug level monitoring: Secondary | ICD-10-CM

## 2015-01-29 DIAGNOSIS — I4891 Unspecified atrial fibrillation: Secondary | ICD-10-CM

## 2015-01-29 DIAGNOSIS — Z7901 Long term (current) use of anticoagulants: Secondary | ICD-10-CM | POA: Diagnosis not present

## 2015-01-29 LAB — POCT INR: INR: 3

## 2015-02-19 ENCOUNTER — Other Ambulatory Visit: Payer: Self-pay | Admitting: Adult Health

## 2015-02-26 DIAGNOSIS — Z9181 History of falling: Secondary | ICD-10-CM | POA: Diagnosis not present

## 2015-02-26 DIAGNOSIS — L989 Disorder of the skin and subcutaneous tissue, unspecified: Secondary | ICD-10-CM | POA: Diagnosis not present

## 2015-02-26 DIAGNOSIS — Z1389 Encounter for screening for other disorder: Secondary | ICD-10-CM | POA: Diagnosis not present

## 2015-03-07 DIAGNOSIS — L821 Other seborrheic keratosis: Secondary | ICD-10-CM | POA: Diagnosis not present

## 2015-03-07 DIAGNOSIS — D0422 Carcinoma in situ of skin of left ear and external auricular canal: Secondary | ICD-10-CM | POA: Diagnosis not present

## 2015-03-07 DIAGNOSIS — L578 Other skin changes due to chronic exposure to nonionizing radiation: Secondary | ICD-10-CM | POA: Diagnosis not present

## 2015-03-07 DIAGNOSIS — C44622 Squamous cell carcinoma of skin of right upper limb, including shoulder: Secondary | ICD-10-CM | POA: Diagnosis not present

## 2015-03-07 DIAGNOSIS — L570 Actinic keratosis: Secondary | ICD-10-CM | POA: Diagnosis not present

## 2015-03-12 ENCOUNTER — Ambulatory Visit (INDEPENDENT_AMBULATORY_CARE_PROVIDER_SITE_OTHER): Payer: Medicare Other | Admitting: *Deleted

## 2015-03-12 DIAGNOSIS — I4891 Unspecified atrial fibrillation: Secondary | ICD-10-CM | POA: Diagnosis not present

## 2015-03-12 DIAGNOSIS — Z5181 Encounter for therapeutic drug level monitoring: Secondary | ICD-10-CM

## 2015-03-12 DIAGNOSIS — Z7901 Long term (current) use of anticoagulants: Secondary | ICD-10-CM

## 2015-03-12 LAB — POCT INR: INR: 2.2

## 2015-03-14 DIAGNOSIS — C44622 Squamous cell carcinoma of skin of right upper limb, including shoulder: Secondary | ICD-10-CM | POA: Diagnosis not present

## 2015-03-24 ENCOUNTER — Other Ambulatory Visit: Payer: Self-pay | Admitting: Cardiology

## 2015-03-26 DIAGNOSIS — K5900 Constipation, unspecified: Secondary | ICD-10-CM | POA: Diagnosis not present

## 2015-04-09 ENCOUNTER — Encounter: Payer: Self-pay | Admitting: *Deleted

## 2015-04-11 NOTE — Progress Notes (Signed)
      HPI: FU atrial fibrillation. Nuclear study in April of 2014 showed an ejection fraction of 84% and no ischemia or infarction. Echocardiogram in April of 2014 showed hyperdynamic LV function, biatrial enlargement, mild right ventricular enlargement, mild aortic insufficiency, mild to moderate mitral regurgitation and moderate tricuspid regurgitation. Holter monitor in October 2014 showed atrial fibrillation with controlled ventricular response. Since I saw him he denies dyspnea, chest pain, palpitations or syncope.  Current Outpatient Prescriptions  Medication Sig Dispense Refill  . allopurinol (ZYLOPRIM) 100 MG tablet Take 100 mg by mouth daily.    . benazepril (LOTENSIN) 20 MG tablet TAKE 1 TABLET DAILY 90 tablet 3  . diltiazem (CARDIZEM CD) 120 MG 24 hr capsule TAKE 1 CAPSULE DAILY 90 capsule 0  . Linaclotide (LINZESS) 145 MCG CAPS capsule Take 145 mcg by mouth daily.    . Polyethylene Glycol 3350 (MIRALAX PO) Take by mouth daily.    Marland Kitchen warfarin (COUMADIN) 5 MG tablet TAKE ONE TO ONE AND ONE-HALF TABLETS DAILY AS DIRECTED BY COUMADIN CLINIC 135 tablet 0   No current facility-administered medications for this visit.     Past Medical History  Diagnosis Date  . Hypertension   . Atrial fibrillation   . Skin cancer     Past Surgical History  Procedure Laterality Date  . Cataracts      History   Social History  . Marital Status: Widowed    Spouse Name: N/A  . Number of Children: 3  . Years of Education: N/A   Occupational History  . Not on file.   Social History Main Topics  . Smoking status: Former Smoker -- 3.00 packs/day    Types: Cigarettes    Start date: 06/22/1949    Quit date: 06/22/1969  . Smokeless tobacco: Not on file  . Alcohol Use: Yes     Comment: Occasional  . Drug Use: Not on file  . Sexual Activity: Not on file   Other Topics Concern  . Not on file   Social History Narrative    ROS: no fevers or chills, productive cough, hemoptysis,  dysphasia, odynophagia, melena, hematochezia, dysuria, hematuria, rash, seizure activity, orthopnea, PND, pedal edema, claudication. Remaining systems are negative.  Physical Exam: Well-developed well-nourished in no acute distress.  Skin is warm and dry.  HEENT is normal.  Neck is supple.  Chest is clear to auscultation with normal expansion.  Cardiovascular exam is irregular, 2/6 systolic murmur apex. Abdominal exam nontender or distended. No masses palpated. Extremities show no edema. neuro grossly intact  ECG Atrial fibrillation, cannot rule out prior septal infarct. Nonspecific ST changes.

## 2015-04-12 ENCOUNTER — Encounter: Payer: Self-pay | Admitting: Cardiology

## 2015-04-12 ENCOUNTER — Ambulatory Visit (INDEPENDENT_AMBULATORY_CARE_PROVIDER_SITE_OTHER): Payer: Medicare Other | Admitting: Cardiology

## 2015-04-12 VITALS — BP 138/72 | HR 75 | Ht 67.0 in | Wt 152.0 lb

## 2015-04-12 DIAGNOSIS — I10 Essential (primary) hypertension: Secondary | ICD-10-CM

## 2015-04-12 DIAGNOSIS — I4891 Unspecified atrial fibrillation: Secondary | ICD-10-CM | POA: Diagnosis not present

## 2015-04-12 DIAGNOSIS — R011 Cardiac murmur, unspecified: Secondary | ICD-10-CM | POA: Diagnosis not present

## 2015-04-12 LAB — BASIC METABOLIC PANEL
BUN: 32 mg/dL — AB (ref 6–23)
CALCIUM: 9.4 mg/dL (ref 8.4–10.5)
CO2: 26 mEq/L (ref 19–32)
Chloride: 105 mEq/L (ref 96–112)
Creat: 1.15 mg/dL (ref 0.50–1.35)
GLUCOSE: 82 mg/dL (ref 70–99)
Potassium: 4.3 mEq/L (ref 3.5–5.3)
Sodium: 140 mEq/L (ref 135–145)

## 2015-04-12 LAB — CBC
HCT: 40.6 % (ref 39.0–52.0)
Hemoglobin: 13.8 g/dL (ref 13.0–17.0)
MCH: 32.7 pg (ref 26.0–34.0)
MCHC: 34 g/dL (ref 30.0–36.0)
MCV: 96.2 fL (ref 78.0–100.0)
MPV: 10 fL (ref 8.6–12.4)
Platelets: 141 10*3/uL — ABNORMAL LOW (ref 150–400)
RBC: 4.22 MIL/uL (ref 4.22–5.81)
RDW: 15 % (ref 11.5–15.5)
WBC: 6.4 10*3/uL (ref 4.0–10.5)

## 2015-04-12 NOTE — Patient Instructions (Signed)
Your physician wants you to follow-up in: 1 Year You will receive a reminder letter in the mail two months in advance. If you don't receive a letter, please call our office to schedule the follow-up appointment.  Your physician has requested that you have an echocardiogram. Echocardiography is a painless test that uses sound waves to create images of your heart. It provides your doctor with information about the size and shape of your heart and how well your heart's chambers and valves are working. This procedure takes approximately one hour. There are no restrictions for this procedure.  Your physician recommends that you return for lab work CBC and BMP

## 2015-04-12 NOTE — Assessment & Plan Note (Signed)
Plan to continue rate control and anticoagulation. Patient not interested in [place]. Check hemoglobin.

## 2015-04-12 NOTE — Assessment & Plan Note (Signed)
Patient's mitral regurgitation murmur is louder on examination. Repeat echocardiogram.

## 2015-04-12 NOTE — Assessment & Plan Note (Signed)
Blood pressure controlled. Continue present medications. Check potassium and renal function. 

## 2015-04-18 DIAGNOSIS — L821 Other seborrheic keratosis: Secondary | ICD-10-CM | POA: Diagnosis not present

## 2015-04-18 DIAGNOSIS — L578 Other skin changes due to chronic exposure to nonionizing radiation: Secondary | ICD-10-CM | POA: Diagnosis not present

## 2015-04-18 DIAGNOSIS — L570 Actinic keratosis: Secondary | ICD-10-CM | POA: Diagnosis not present

## 2015-04-18 DIAGNOSIS — C44622 Squamous cell carcinoma of skin of right upper limb, including shoulder: Secondary | ICD-10-CM | POA: Diagnosis not present

## 2015-04-23 ENCOUNTER — Ambulatory Visit (INDEPENDENT_AMBULATORY_CARE_PROVIDER_SITE_OTHER): Payer: Medicare Other | Admitting: *Deleted

## 2015-04-23 ENCOUNTER — Ambulatory Visit (HOSPITAL_COMMUNITY)
Admission: RE | Admit: 2015-04-23 | Discharge: 2015-04-23 | Disposition: A | Discharge status: Home / Self Care | Payer: Medicare Other | Source: Ambulatory Visit | Attending: Cardiology | Admitting: Cardiology

## 2015-04-23 DIAGNOSIS — I4891 Unspecified atrial fibrillation: Secondary | ICD-10-CM

## 2015-04-23 DIAGNOSIS — I340 Nonrheumatic mitral (valve) insufficiency: Secondary | ICD-10-CM | POA: Diagnosis not present

## 2015-04-23 DIAGNOSIS — Z7901 Long term (current) use of anticoagulants: Secondary | ICD-10-CM | POA: Diagnosis not present

## 2015-04-23 DIAGNOSIS — R011 Cardiac murmur, unspecified: Secondary | ICD-10-CM | POA: Diagnosis not present

## 2015-04-23 DIAGNOSIS — Z5181 Encounter for therapeutic drug level monitoring: Secondary | ICD-10-CM

## 2015-04-23 DIAGNOSIS — I517 Cardiomegaly: Secondary | ICD-10-CM | POA: Insufficient documentation

## 2015-04-23 DIAGNOSIS — I351 Nonrheumatic aortic (valve) insufficiency: Secondary | ICD-10-CM | POA: Diagnosis not present

## 2015-04-23 LAB — POCT INR: INR: 1.7

## 2015-04-23 MED ORDER — PERFLUTREN LIPID MICROSPHERE
1.0000 mL | INTRAVENOUS | Status: AC | PRN
  Administered 2015-04-23: 1 mL via INTRAVENOUS

## 2015-04-26 DIAGNOSIS — I499 Cardiac arrhythmia, unspecified: Secondary | ICD-10-CM | POA: Diagnosis not present

## 2015-04-26 DIAGNOSIS — M1990 Unspecified osteoarthritis, unspecified site: Secondary | ICD-10-CM | POA: Diagnosis not present

## 2015-04-26 DIAGNOSIS — I10 Essential (primary) hypertension: Secondary | ICD-10-CM | POA: Diagnosis not present

## 2015-04-26 DIAGNOSIS — M109 Gout, unspecified: Secondary | ICD-10-CM | POA: Diagnosis not present

## 2015-05-07 ENCOUNTER — Ambulatory Visit (INDEPENDENT_AMBULATORY_CARE_PROVIDER_SITE_OTHER): Payer: Medicare Other | Admitting: *Deleted

## 2015-05-07 DIAGNOSIS — I4891 Unspecified atrial fibrillation: Secondary | ICD-10-CM | POA: Diagnosis not present

## 2015-05-07 DIAGNOSIS — Z5181 Encounter for therapeutic drug level monitoring: Secondary | ICD-10-CM

## 2015-05-07 DIAGNOSIS — Z7901 Long term (current) use of anticoagulants: Secondary | ICD-10-CM | POA: Diagnosis not present

## 2015-05-07 LAB — POCT INR: INR: 1.7

## 2015-05-19 ENCOUNTER — Other Ambulatory Visit: Payer: Self-pay | Admitting: Cardiology

## 2015-05-21 ENCOUNTER — Ambulatory Visit (INDEPENDENT_AMBULATORY_CARE_PROVIDER_SITE_OTHER): Payer: Medicare Other

## 2015-05-21 DIAGNOSIS — Z5181 Encounter for therapeutic drug level monitoring: Secondary | ICD-10-CM

## 2015-05-21 DIAGNOSIS — I4891 Unspecified atrial fibrillation: Secondary | ICD-10-CM | POA: Diagnosis not present

## 2015-05-21 DIAGNOSIS — Z7901 Long term (current) use of anticoagulants: Secondary | ICD-10-CM | POA: Diagnosis not present

## 2015-05-21 LAB — POCT INR: INR: 2.1

## 2015-05-27 DIAGNOSIS — I482 Chronic atrial fibrillation: Secondary | ICD-10-CM | POA: Diagnosis not present

## 2015-05-27 DIAGNOSIS — M109 Gout, unspecified: Secondary | ICD-10-CM | POA: Diagnosis not present

## 2015-05-27 DIAGNOSIS — M1990 Unspecified osteoarthritis, unspecified site: Secondary | ICD-10-CM | POA: Diagnosis not present

## 2015-05-27 DIAGNOSIS — I10 Essential (primary) hypertension: Secondary | ICD-10-CM | POA: Diagnosis not present

## 2015-06-04 ENCOUNTER — Other Ambulatory Visit: Payer: Self-pay | Admitting: Cardiology

## 2015-06-11 ENCOUNTER — Ambulatory Visit (INDEPENDENT_AMBULATORY_CARE_PROVIDER_SITE_OTHER): Payer: Medicare Other

## 2015-06-11 DIAGNOSIS — I4891 Unspecified atrial fibrillation: Secondary | ICD-10-CM | POA: Diagnosis not present

## 2015-06-11 DIAGNOSIS — Z5181 Encounter for therapeutic drug level monitoring: Secondary | ICD-10-CM

## 2015-06-11 DIAGNOSIS — Z7901 Long term (current) use of anticoagulants: Secondary | ICD-10-CM | POA: Diagnosis not present

## 2015-06-11 LAB — POCT INR: INR: 2.8

## 2015-06-21 ENCOUNTER — Other Ambulatory Visit: Payer: Self-pay | Admitting: Cardiology

## 2015-07-01 DIAGNOSIS — L821 Other seborrheic keratosis: Secondary | ICD-10-CM | POA: Diagnosis not present

## 2015-07-01 DIAGNOSIS — L720 Epidermal cyst: Secondary | ICD-10-CM | POA: Diagnosis not present

## 2015-07-09 ENCOUNTER — Ambulatory Visit (INDEPENDENT_AMBULATORY_CARE_PROVIDER_SITE_OTHER): Payer: Medicare Other | Admitting: Pharmacist

## 2015-07-09 DIAGNOSIS — I4891 Unspecified atrial fibrillation: Secondary | ICD-10-CM | POA: Diagnosis not present

## 2015-07-09 DIAGNOSIS — Z7901 Long term (current) use of anticoagulants: Secondary | ICD-10-CM | POA: Diagnosis not present

## 2015-07-09 DIAGNOSIS — Z5181 Encounter for therapeutic drug level monitoring: Secondary | ICD-10-CM | POA: Diagnosis not present

## 2015-07-09 LAB — POCT INR: INR: 3

## 2015-08-08 DIAGNOSIS — E785 Hyperlipidemia, unspecified: Secondary | ICD-10-CM | POA: Diagnosis not present

## 2015-08-08 DIAGNOSIS — J309 Allergic rhinitis, unspecified: Secondary | ICD-10-CM | POA: Diagnosis not present

## 2015-08-08 DIAGNOSIS — R5382 Chronic fatigue, unspecified: Secondary | ICD-10-CM | POA: Diagnosis not present

## 2015-08-08 DIAGNOSIS — I82409 Acute embolism and thrombosis of unspecified deep veins of unspecified lower extremity: Secondary | ICD-10-CM | POA: Diagnosis not present

## 2015-08-13 DIAGNOSIS — L578 Other skin changes due to chronic exposure to nonionizing radiation: Secondary | ICD-10-CM | POA: Diagnosis not present

## 2015-08-13 DIAGNOSIS — D0330 Melanoma in situ of unspecified part of face: Secondary | ICD-10-CM | POA: Diagnosis not present

## 2015-08-13 DIAGNOSIS — C44319 Basal cell carcinoma of skin of other parts of face: Secondary | ICD-10-CM | POA: Diagnosis not present

## 2015-08-13 DIAGNOSIS — L570 Actinic keratosis: Secondary | ICD-10-CM | POA: Diagnosis not present

## 2015-08-13 DIAGNOSIS — D485 Neoplasm of uncertain behavior of skin: Secondary | ICD-10-CM | POA: Diagnosis not present

## 2015-08-19 ENCOUNTER — Ambulatory Visit (INDEPENDENT_AMBULATORY_CARE_PROVIDER_SITE_OTHER): Payer: Medicare Other | Admitting: *Deleted

## 2015-08-19 DIAGNOSIS — Z7901 Long term (current) use of anticoagulants: Secondary | ICD-10-CM

## 2015-08-19 DIAGNOSIS — Z5181 Encounter for therapeutic drug level monitoring: Secondary | ICD-10-CM

## 2015-08-19 DIAGNOSIS — I4891 Unspecified atrial fibrillation: Secondary | ICD-10-CM | POA: Diagnosis not present

## 2015-08-19 LAB — POCT INR: INR: 2.8

## 2015-08-20 DIAGNOSIS — D0339 Melanoma in situ of other parts of face: Secondary | ICD-10-CM | POA: Diagnosis not present

## 2015-08-27 DIAGNOSIS — M109 Gout, unspecified: Secondary | ICD-10-CM | POA: Diagnosis not present

## 2015-08-27 DIAGNOSIS — M1990 Unspecified osteoarthritis, unspecified site: Secondary | ICD-10-CM | POA: Diagnosis not present

## 2015-08-27 DIAGNOSIS — I482 Chronic atrial fibrillation: Secondary | ICD-10-CM | POA: Diagnosis not present

## 2015-08-27 DIAGNOSIS — I499 Cardiac arrhythmia, unspecified: Secondary | ICD-10-CM | POA: Diagnosis not present

## 2015-09-07 DIAGNOSIS — R5382 Chronic fatigue, unspecified: Secondary | ICD-10-CM | POA: Diagnosis not present

## 2015-09-07 DIAGNOSIS — I82409 Acute embolism and thrombosis of unspecified deep veins of unspecified lower extremity: Secondary | ICD-10-CM | POA: Diagnosis not present

## 2015-09-07 DIAGNOSIS — J309 Allergic rhinitis, unspecified: Secondary | ICD-10-CM | POA: Diagnosis not present

## 2015-09-07 DIAGNOSIS — E785 Hyperlipidemia, unspecified: Secondary | ICD-10-CM | POA: Diagnosis not present

## 2015-10-02 ENCOUNTER — Ambulatory Visit (INDEPENDENT_AMBULATORY_CARE_PROVIDER_SITE_OTHER): Payer: Medicare Other | Admitting: Pharmacist

## 2015-10-02 DIAGNOSIS — Z5181 Encounter for therapeutic drug level monitoring: Secondary | ICD-10-CM

## 2015-10-02 DIAGNOSIS — Z7901 Long term (current) use of anticoagulants: Secondary | ICD-10-CM | POA: Diagnosis not present

## 2015-10-02 DIAGNOSIS — I4891 Unspecified atrial fibrillation: Secondary | ICD-10-CM | POA: Diagnosis not present

## 2015-10-02 LAB — POCT INR: INR: 3.2

## 2015-10-30 ENCOUNTER — Ambulatory Visit (INDEPENDENT_AMBULATORY_CARE_PROVIDER_SITE_OTHER): Payer: Medicare Other | Admitting: Pharmacist

## 2015-10-30 DIAGNOSIS — I4891 Unspecified atrial fibrillation: Secondary | ICD-10-CM

## 2015-10-30 DIAGNOSIS — Z5181 Encounter for therapeutic drug level monitoring: Secondary | ICD-10-CM | POA: Diagnosis not present

## 2015-10-30 DIAGNOSIS — Z7901 Long term (current) use of anticoagulants: Secondary | ICD-10-CM | POA: Diagnosis not present

## 2015-10-30 LAB — POCT INR: INR: 2.6

## 2015-11-14 DIAGNOSIS — M109 Gout, unspecified: Secondary | ICD-10-CM | POA: Diagnosis not present

## 2015-11-14 DIAGNOSIS — Z79899 Other long term (current) drug therapy: Secondary | ICD-10-CM | POA: Diagnosis not present

## 2015-11-14 DIAGNOSIS — I482 Chronic atrial fibrillation: Secondary | ICD-10-CM | POA: Diagnosis not present

## 2015-11-14 DIAGNOSIS — I110 Hypertensive heart disease with heart failure: Secondary | ICD-10-CM | POA: Diagnosis not present

## 2015-11-14 DIAGNOSIS — K5909 Other constipation: Secondary | ICD-10-CM | POA: Diagnosis not present

## 2015-11-15 DIAGNOSIS — L821 Other seborrheic keratosis: Secondary | ICD-10-CM | POA: Diagnosis not present

## 2015-11-15 DIAGNOSIS — C44622 Squamous cell carcinoma of skin of right upper limb, including shoulder: Secondary | ICD-10-CM | POA: Diagnosis not present

## 2015-11-15 DIAGNOSIS — D0339 Melanoma in situ of other parts of face: Secondary | ICD-10-CM | POA: Diagnosis not present

## 2015-11-27 ENCOUNTER — Ambulatory Visit (INDEPENDENT_AMBULATORY_CARE_PROVIDER_SITE_OTHER): Payer: Medicare Other | Admitting: *Deleted

## 2015-11-27 DIAGNOSIS — I4891 Unspecified atrial fibrillation: Secondary | ICD-10-CM | POA: Diagnosis not present

## 2015-11-27 DIAGNOSIS — Z5181 Encounter for therapeutic drug level monitoring: Secondary | ICD-10-CM

## 2015-11-27 DIAGNOSIS — Z7901 Long term (current) use of anticoagulants: Secondary | ICD-10-CM

## 2015-11-27 LAB — POCT INR: INR: 2.3

## 2015-12-16 ENCOUNTER — Other Ambulatory Visit: Payer: Self-pay | Admitting: Cardiology

## 2015-12-25 ENCOUNTER — Ambulatory Visit (INDEPENDENT_AMBULATORY_CARE_PROVIDER_SITE_OTHER): Payer: Medicare Other | Admitting: *Deleted

## 2015-12-25 DIAGNOSIS — Z5181 Encounter for therapeutic drug level monitoring: Secondary | ICD-10-CM | POA: Diagnosis not present

## 2015-12-25 DIAGNOSIS — Z7901 Long term (current) use of anticoagulants: Secondary | ICD-10-CM

## 2015-12-25 DIAGNOSIS — I4891 Unspecified atrial fibrillation: Secondary | ICD-10-CM | POA: Diagnosis not present

## 2015-12-25 LAB — POCT INR: INR: 1.9

## 2015-12-29 DIAGNOSIS — R509 Fever, unspecified: Secondary | ICD-10-CM | POA: Diagnosis not present

## 2015-12-29 DIAGNOSIS — I4901 Ventricular fibrillation: Secondary | ICD-10-CM | POA: Diagnosis not present

## 2015-12-29 DIAGNOSIS — I480 Paroxysmal atrial fibrillation: Secondary | ICD-10-CM | POA: Diagnosis not present

## 2015-12-29 DIAGNOSIS — J069 Acute upper respiratory infection, unspecified: Secondary | ICD-10-CM | POA: Diagnosis not present

## 2015-12-29 DIAGNOSIS — I4891 Unspecified atrial fibrillation: Secondary | ICD-10-CM | POA: Diagnosis not present

## 2015-12-29 DIAGNOSIS — R05 Cough: Secondary | ICD-10-CM | POA: Diagnosis not present

## 2015-12-29 DIAGNOSIS — R531 Weakness: Secondary | ICD-10-CM | POA: Diagnosis not present

## 2016-01-06 DIAGNOSIS — R5381 Other malaise: Secondary | ICD-10-CM | POA: Diagnosis not present

## 2016-01-06 DIAGNOSIS — R5383 Other fatigue: Secondary | ICD-10-CM | POA: Diagnosis not present

## 2016-01-06 DIAGNOSIS — I110 Hypertensive heart disease with heart failure: Secondary | ICD-10-CM | POA: Diagnosis not present

## 2016-01-06 DIAGNOSIS — I482 Chronic atrial fibrillation: Secondary | ICD-10-CM | POA: Diagnosis not present

## 2016-01-22 ENCOUNTER — Ambulatory Visit (INDEPENDENT_AMBULATORY_CARE_PROVIDER_SITE_OTHER): Payer: Medicare Other | Admitting: *Deleted

## 2016-01-22 DIAGNOSIS — Z7901 Long term (current) use of anticoagulants: Secondary | ICD-10-CM

## 2016-01-22 DIAGNOSIS — Z5181 Encounter for therapeutic drug level monitoring: Secondary | ICD-10-CM | POA: Diagnosis not present

## 2016-01-22 DIAGNOSIS — I4891 Unspecified atrial fibrillation: Secondary | ICD-10-CM

## 2016-01-22 LAB — POCT INR: INR: 2.9

## 2016-02-12 DIAGNOSIS — K5909 Other constipation: Secondary | ICD-10-CM | POA: Diagnosis not present

## 2016-02-12 DIAGNOSIS — I482 Chronic atrial fibrillation: Secondary | ICD-10-CM | POA: Diagnosis not present

## 2016-02-12 DIAGNOSIS — Z0000 Encounter for general adult medical examination without abnormal findings: Secondary | ICD-10-CM | POA: Diagnosis not present

## 2016-02-12 DIAGNOSIS — I110 Hypertensive heart disease with heart failure: Secondary | ICD-10-CM | POA: Diagnosis not present

## 2016-02-12 DIAGNOSIS — R413 Other amnesia: Secondary | ICD-10-CM | POA: Diagnosis not present

## 2016-02-19 ENCOUNTER — Ambulatory Visit (INDEPENDENT_AMBULATORY_CARE_PROVIDER_SITE_OTHER): Payer: Medicare Other | Admitting: *Deleted

## 2016-02-19 DIAGNOSIS — Z7901 Long term (current) use of anticoagulants: Secondary | ICD-10-CM

## 2016-02-19 DIAGNOSIS — I4891 Unspecified atrial fibrillation: Secondary | ICD-10-CM

## 2016-02-19 DIAGNOSIS — Z5181 Encounter for therapeutic drug level monitoring: Secondary | ICD-10-CM

## 2016-02-19 LAB — POCT INR: INR: 2.9

## 2016-02-25 DIAGNOSIS — Z79899 Other long term (current) drug therapy: Secondary | ICD-10-CM | POA: Diagnosis not present

## 2016-02-25 DIAGNOSIS — I6789 Other cerebrovascular disease: Secondary | ICD-10-CM | POA: Diagnosis not present

## 2016-02-25 DIAGNOSIS — J320 Chronic maxillary sinusitis: Secondary | ICD-10-CM | POA: Diagnosis not present

## 2016-02-25 DIAGNOSIS — R269 Unspecified abnormalities of gait and mobility: Secondary | ICD-10-CM | POA: Diagnosis not present

## 2016-02-25 DIAGNOSIS — K5909 Other constipation: Secondary | ICD-10-CM | POA: Diagnosis not present

## 2016-02-25 DIAGNOSIS — I482 Chronic atrial fibrillation: Secondary | ICD-10-CM | POA: Diagnosis not present

## 2016-02-25 DIAGNOSIS — I110 Hypertensive heart disease with heart failure: Secondary | ICD-10-CM | POA: Diagnosis not present

## 2016-02-28 ENCOUNTER — Other Ambulatory Visit: Payer: Self-pay | Admitting: Cardiology

## 2016-02-28 NOTE — Telephone Encounter (Signed)
REFILL 

## 2016-03-13 DIAGNOSIS — K5909 Other constipation: Secondary | ICD-10-CM | POA: Diagnosis not present

## 2016-03-13 DIAGNOSIS — I482 Chronic atrial fibrillation: Secondary | ICD-10-CM | POA: Diagnosis not present

## 2016-03-13 DIAGNOSIS — M109 Gout, unspecified: Secondary | ICD-10-CM | POA: Diagnosis not present

## 2016-03-13 DIAGNOSIS — I110 Hypertensive heart disease with heart failure: Secondary | ICD-10-CM | POA: Diagnosis not present

## 2016-03-25 ENCOUNTER — Ambulatory Visit (INDEPENDENT_AMBULATORY_CARE_PROVIDER_SITE_OTHER): Payer: Medicare Other | Admitting: *Deleted

## 2016-03-25 DIAGNOSIS — Z5181 Encounter for therapeutic drug level monitoring: Secondary | ICD-10-CM | POA: Diagnosis not present

## 2016-03-25 DIAGNOSIS — Z7901 Long term (current) use of anticoagulants: Secondary | ICD-10-CM

## 2016-03-25 DIAGNOSIS — I4891 Unspecified atrial fibrillation: Secondary | ICD-10-CM

## 2016-03-25 LAB — POCT INR: INR: 3

## 2016-03-31 DIAGNOSIS — M79671 Pain in right foot: Secondary | ICD-10-CM | POA: Diagnosis not present

## 2016-04-20 DIAGNOSIS — L578 Other skin changes due to chronic exposure to nonionizing radiation: Secondary | ICD-10-CM | POA: Diagnosis not present

## 2016-04-20 DIAGNOSIS — L821 Other seborrheic keratosis: Secondary | ICD-10-CM | POA: Diagnosis not present

## 2016-04-21 NOTE — Progress Notes (Signed)
      HPI: FU atrial fibrillation. Nuclear study in April of 2014 showed an ejection fraction of 84% and no ischemia or infarction. Holter monitor in October 2014 showed atrial fibrillation with controlled ventricular response. Echocardiogram July 2016 showed normal LV systolic function, mild aortic stenosis with mean gradient 17 mmHg, biatrial enlargement and mild mitral regurgitation. Since I saw him Patient has occasional dizziness particularly with standing. He denies dyspnea, chest pain, palpitations, syncope or bleeding.   Current Outpatient Prescriptions  Medication Sig Dispense Refill  . allopurinol (ZYLOPRIM) 100 MG tablet Take 100 mg by mouth daily.    . benazepril (LOTENSIN) 20 MG tablet Take 1 tablet (20 mg total) by mouth daily. KEEP OV. 90 tablet 0  . diltiazem (CARDIZEM CD) 120 MG 24 hr capsule TAKE 1 CAPSULE DAILY 90 capsule 3  . Linaclotide (LINZESS) 145 MCG CAPS capsule Take 290 mcg by mouth daily.     . Polyethylene Glycol 3350 (MIRALAX PO) Take by mouth daily.    Marland Kitchen warfarin (COUMADIN) 5 MG tablet TAKE ONE TO ONE AND ONE-HALF TABLETS DAILY AS DIRECTED BY COUMADIN CLINIC 135 tablet 1   No current facility-administered medications for this visit.     Past Medical History  Diagnosis Date  . Hypertension   . Atrial fibrillation ([place])   . Skin cancer     Past Surgical History  Procedure Laterality Date  . Cataracts      Social History   Social History  . Marital Status: Widowed    Spouse Name: N/A  . Number of Children: 3  . Years of Education: N/A   Occupational History  . Not on file.   Social History Main Topics  . Smoking status: Former Smoker -- 3.00 packs/day    Types: Cigarettes    Start date: 06/22/1949    Quit date: 06/22/1969  . Smokeless tobacco: Not on file  . Alcohol Use: Yes     Comment: Occasional  . Drug Use: Not on file  . Sexual Activity: Not on file   Other Topics Concern  . Not on file   Social History Narrative    Family  History  Problem Relation Age of Onset  . Heart disease      No family history    ROS: no fevers or chills, productive cough, hemoptysis, dysphasia, odynophagia, melena, hematochezia, dysuria, hematuria, rash, seizure activity, orthopnea, PND, pedal edema, claudication. Remaining systems are negative.  Physical Exam: Well-developed well-nourished in no acute distress.  Skin is warm and dry.  HEENT is normal.  Neck is supple.  Chest is clear to auscultation with normal expansion.  Cardiovascular exam is irregular, 2/6 systolic murmur LSB Abdominal exam nontender or distended. No masses palpated. Extremities show no edema. neuro grossly intact  ECG Atrial fibrillation at a rate of 88. Normal axis. Septal infarct.   A/P  1 Atrial fibrillation-Continue Cardizem for rate control. Continue Coumadin. Patient not interested in DOACs. Check hgb.  2 hypertension-Patient describes occasional dizziness. I will decrease his benazepril to 5 mg daily to see if this helps. Some of his symptoms are orthostatic in nature. Check potassium and renal function.  3 aortic stenosis-mild on most recent echocardiogram. We will likely repeat when he returns in 1 year.  Peter Moreno

## 2016-04-23 ENCOUNTER — Ambulatory Visit (INDEPENDENT_AMBULATORY_CARE_PROVIDER_SITE_OTHER): Payer: Medicare Other | Admitting: Cardiology

## 2016-04-23 ENCOUNTER — Encounter: Payer: Self-pay | Admitting: Cardiology

## 2016-04-23 ENCOUNTER — Ambulatory Visit (INDEPENDENT_AMBULATORY_CARE_PROVIDER_SITE_OTHER): Payer: Medicare Other | Admitting: Pharmacist

## 2016-04-23 VITALS — BP 118/60 | HR 88 | Ht 66.0 in | Wt 138.0 lb

## 2016-04-23 DIAGNOSIS — I350 Nonrheumatic aortic (valve) stenosis: Secondary | ICD-10-CM

## 2016-04-23 DIAGNOSIS — Z7901 Long term (current) use of anticoagulants: Secondary | ICD-10-CM | POA: Diagnosis not present

## 2016-04-23 DIAGNOSIS — I482 Chronic atrial fibrillation, unspecified: Secondary | ICD-10-CM

## 2016-04-23 DIAGNOSIS — I4891 Unspecified atrial fibrillation: Secondary | ICD-10-CM | POA: Diagnosis not present

## 2016-04-23 DIAGNOSIS — Z5181 Encounter for therapeutic drug level monitoring: Secondary | ICD-10-CM

## 2016-04-23 LAB — CBC
HEMATOCRIT: 37.1 % — AB (ref 38.5–50.0)
Hemoglobin: 12.6 g/dL — ABNORMAL LOW (ref 13.2–17.1)
MCH: 34.3 pg — ABNORMAL HIGH (ref 27.0–33.0)
MCHC: 34 g/dL (ref 32.0–36.0)
MCV: 101.1 fL — ABNORMAL HIGH (ref 80.0–100.0)
MPV: 9.9 fL (ref 7.5–12.5)
Platelets: 146 10*3/uL (ref 140–400)
RBC: 3.67 MIL/uL — ABNORMAL LOW (ref 4.20–5.80)
RDW: 15.5 % — AB (ref 11.0–15.0)
WBC: 5.6 10*3/uL (ref 3.8–10.8)

## 2016-04-23 LAB — BASIC METABOLIC PANEL
BUN: 35 mg/dL — AB (ref 7–25)
CALCIUM: 9.5 mg/dL (ref 8.6–10.3)
CO2: 25 mmol/L (ref 20–31)
CREATININE: 1.15 mg/dL — AB (ref 0.70–1.11)
Chloride: 108 mmol/L (ref 98–110)
GLUCOSE: 106 mg/dL — AB (ref 65–99)
Potassium: 4.7 mmol/L (ref 3.5–5.3)
Sodium: 141 mmol/L (ref 135–146)

## 2016-04-23 LAB — POCT INR: INR: 1.6

## 2016-04-23 MED ORDER — BENAZEPRIL HCL 5 MG PO TABS
5.0000 mg | ORAL_TABLET | Freq: Every day | ORAL | Status: DC
Start: 2016-04-23 — End: 2016-07-04

## 2016-04-23 NOTE — Patient Instructions (Addendum)
Medication Instructions:   START TAKING BENAZEPRIL 5 MG ONCE A DAY   If you need a refill on your cardiac medications before your next appointment, please call your pharmacy.  Labwork: CBC  AND BMET    Testing/Procedures: NONE ORDER TODAY    Follow-Up: Your physician wants you to follow-up in: [place] will receive a reminder letter in the mail two months in advance. If you don't receive a letter, please call our office to schedule the follow-up appointment.     Any Other Special Instructions Will Be Listed Below (If Applicable).

## 2016-05-14 ENCOUNTER — Ambulatory Visit (INDEPENDENT_AMBULATORY_CARE_PROVIDER_SITE_OTHER): Payer: Medicare Other | Admitting: *Deleted

## 2016-05-14 ENCOUNTER — Other Ambulatory Visit: Payer: Self-pay | Admitting: Cardiology

## 2016-05-14 DIAGNOSIS — I4891 Unspecified atrial fibrillation: Secondary | ICD-10-CM | POA: Diagnosis not present

## 2016-05-14 DIAGNOSIS — Z5181 Encounter for therapeutic drug level monitoring: Secondary | ICD-10-CM

## 2016-05-14 DIAGNOSIS — Z7901 Long term (current) use of anticoagulants: Secondary | ICD-10-CM

## 2016-05-14 LAB — POCT INR: INR: 1.6

## 2016-05-14 NOTE — Telephone Encounter (Signed)
Rx request sent to pharmacy.  

## 2016-05-29 ENCOUNTER — Ambulatory Visit (INDEPENDENT_AMBULATORY_CARE_PROVIDER_SITE_OTHER): Payer: Medicare Other | Admitting: *Deleted

## 2016-05-29 DIAGNOSIS — Z7901 Long term (current) use of anticoagulants: Secondary | ICD-10-CM | POA: Diagnosis not present

## 2016-05-29 DIAGNOSIS — I4891 Unspecified atrial fibrillation: Secondary | ICD-10-CM

## 2016-05-29 DIAGNOSIS — Z5181 Encounter for therapeutic drug level monitoring: Secondary | ICD-10-CM

## 2016-05-29 LAB — POCT INR: INR: 2

## 2016-06-02 ENCOUNTER — Other Ambulatory Visit: Payer: Self-pay

## 2016-06-13 ENCOUNTER — Other Ambulatory Visit: Payer: Self-pay | Admitting: Cardiology

## 2016-06-19 ENCOUNTER — Ambulatory Visit (INDEPENDENT_AMBULATORY_CARE_PROVIDER_SITE_OTHER): Payer: Medicare Other | Admitting: *Deleted

## 2016-06-19 DIAGNOSIS — Z7901 Long term (current) use of anticoagulants: Secondary | ICD-10-CM | POA: Diagnosis not present

## 2016-06-19 DIAGNOSIS — Z5181 Encounter for therapeutic drug level monitoring: Secondary | ICD-10-CM

## 2016-06-19 DIAGNOSIS — I4891 Unspecified atrial fibrillation: Secondary | ICD-10-CM | POA: Diagnosis not present

## 2016-06-19 LAB — POCT INR: INR: 2.3

## 2016-07-03 DIAGNOSIS — D519 Vitamin B12 deficiency anemia, unspecified: Secondary | ICD-10-CM | POA: Diagnosis not present

## 2016-07-03 DIAGNOSIS — I110 Hypertensive heart disease with heart failure: Secondary | ICD-10-CM | POA: Diagnosis not present

## 2016-07-03 DIAGNOSIS — M159 Polyosteoarthritis, unspecified: Secondary | ICD-10-CM | POA: Diagnosis not present

## 2016-07-03 DIAGNOSIS — Z79899 Other long term (current) drug therapy: Secondary | ICD-10-CM | POA: Diagnosis not present

## 2016-07-03 DIAGNOSIS — I482 Chronic atrial fibrillation: Secondary | ICD-10-CM | POA: Diagnosis not present

## 2016-07-03 DIAGNOSIS — G301 Alzheimer's disease with late onset: Secondary | ICD-10-CM | POA: Diagnosis not present

## 2016-07-03 DIAGNOSIS — M109 Gout, unspecified: Secondary | ICD-10-CM | POA: Diagnosis not present

## 2016-07-04 ENCOUNTER — Other Ambulatory Visit: Payer: Self-pay | Admitting: Cardiology

## 2016-07-06 NOTE — Telephone Encounter (Signed)
Rx request sent to pharmacy.  

## 2016-07-17 ENCOUNTER — Ambulatory Visit (INDEPENDENT_AMBULATORY_CARE_PROVIDER_SITE_OTHER): Payer: Medicare Other

## 2016-07-17 DIAGNOSIS — Z5181 Encounter for therapeutic drug level monitoring: Secondary | ICD-10-CM | POA: Diagnosis not present

## 2016-07-17 DIAGNOSIS — I4891 Unspecified atrial fibrillation: Secondary | ICD-10-CM | POA: Diagnosis not present

## 2016-07-17 DIAGNOSIS — Z7901 Long term (current) use of anticoagulants: Secondary | ICD-10-CM | POA: Diagnosis not present

## 2016-07-17 LAB — POCT INR: INR: 2.5

## 2016-08-21 ENCOUNTER — Ambulatory Visit (INDEPENDENT_AMBULATORY_CARE_PROVIDER_SITE_OTHER): Payer: Medicare Other | Admitting: *Deleted

## 2016-08-21 DIAGNOSIS — I4891 Unspecified atrial fibrillation: Secondary | ICD-10-CM | POA: Diagnosis not present

## 2016-08-21 DIAGNOSIS — Z7901 Long term (current) use of anticoagulants: Secondary | ICD-10-CM | POA: Diagnosis not present

## 2016-08-21 DIAGNOSIS — Z5181 Encounter for therapeutic drug level monitoring: Secondary | ICD-10-CM | POA: Diagnosis not present

## 2016-08-21 LAB — POCT INR: INR: 2.2

## 2016-10-02 ENCOUNTER — Ambulatory Visit (INDEPENDENT_AMBULATORY_CARE_PROVIDER_SITE_OTHER): Payer: Medicare Other

## 2016-10-02 DIAGNOSIS — Z5181 Encounter for therapeutic drug level monitoring: Secondary | ICD-10-CM | POA: Diagnosis not present

## 2016-10-02 DIAGNOSIS — Z7901 Long term (current) use of anticoagulants: Secondary | ICD-10-CM | POA: Diagnosis not present

## 2016-10-02 DIAGNOSIS — I4891 Unspecified atrial fibrillation: Secondary | ICD-10-CM

## 2016-10-02 LAB — POCT INR: INR: 2.7

## 2016-11-13 ENCOUNTER — Ambulatory Visit (INDEPENDENT_AMBULATORY_CARE_PROVIDER_SITE_OTHER): Payer: Medicare Other | Admitting: *Deleted

## 2016-11-13 DIAGNOSIS — I4891 Unspecified atrial fibrillation: Secondary | ICD-10-CM | POA: Diagnosis not present

## 2016-11-13 DIAGNOSIS — Z5181 Encounter for therapeutic drug level monitoring: Secondary | ICD-10-CM | POA: Diagnosis not present

## 2016-11-13 DIAGNOSIS — Z7901 Long term (current) use of anticoagulants: Secondary | ICD-10-CM

## 2016-11-13 LAB — POCT INR: INR: 2.7

## 2016-12-01 DIAGNOSIS — L821 Other seborrheic keratosis: Secondary | ICD-10-CM | POA: Diagnosis not present

## 2016-12-01 DIAGNOSIS — L578 Other skin changes due to chronic exposure to nonionizing radiation: Secondary | ICD-10-CM | POA: Diagnosis not present

## 2016-12-01 DIAGNOSIS — L570 Actinic keratosis: Secondary | ICD-10-CM | POA: Diagnosis not present

## 2016-12-01 DIAGNOSIS — C44329 Squamous cell carcinoma of skin of other parts of face: Secondary | ICD-10-CM | POA: Diagnosis not present

## 2016-12-01 DIAGNOSIS — D0339 Melanoma in situ of other parts of face: Secondary | ICD-10-CM | POA: Diagnosis not present

## 2016-12-07 DIAGNOSIS — D044 Carcinoma in situ of skin of scalp and neck: Secondary | ICD-10-CM | POA: Diagnosis not present

## 2016-12-12 ENCOUNTER — Other Ambulatory Visit: Payer: Self-pay | Admitting: Cardiology

## 2016-12-25 ENCOUNTER — Ambulatory Visit (INDEPENDENT_AMBULATORY_CARE_PROVIDER_SITE_OTHER): Payer: Medicare Other

## 2016-12-25 DIAGNOSIS — Z7901 Long term (current) use of anticoagulants: Secondary | ICD-10-CM

## 2016-12-25 DIAGNOSIS — Z5181 Encounter for therapeutic drug level monitoring: Secondary | ICD-10-CM

## 2016-12-25 DIAGNOSIS — I4891 Unspecified atrial fibrillation: Secondary | ICD-10-CM | POA: Diagnosis not present

## 2016-12-25 LAB — POCT INR: INR: 3.6

## 2017-01-22 ENCOUNTER — Ambulatory Visit (INDEPENDENT_AMBULATORY_CARE_PROVIDER_SITE_OTHER): Payer: Medicare Other | Admitting: Pharmacist

## 2017-01-22 DIAGNOSIS — Z7901 Long term (current) use of anticoagulants: Secondary | ICD-10-CM | POA: Diagnosis not present

## 2017-01-22 DIAGNOSIS — I4891 Unspecified atrial fibrillation: Secondary | ICD-10-CM | POA: Diagnosis not present

## 2017-01-22 DIAGNOSIS — Z5181 Encounter for therapeutic drug level monitoring: Secondary | ICD-10-CM

## 2017-01-22 LAB — POCT INR: INR: 3.1

## 2017-02-19 ENCOUNTER — Ambulatory Visit (INDEPENDENT_AMBULATORY_CARE_PROVIDER_SITE_OTHER): Payer: Medicare Other | Admitting: *Deleted

## 2017-02-19 DIAGNOSIS — I4891 Unspecified atrial fibrillation: Secondary | ICD-10-CM

## 2017-02-19 DIAGNOSIS — Z7901 Long term (current) use of anticoagulants: Secondary | ICD-10-CM | POA: Diagnosis not present

## 2017-02-19 DIAGNOSIS — Z5181 Encounter for therapeutic drug level monitoring: Secondary | ICD-10-CM

## 2017-02-19 LAB — POCT INR: INR: 2.4

## 2017-03-19 ENCOUNTER — Ambulatory Visit (INDEPENDENT_AMBULATORY_CARE_PROVIDER_SITE_OTHER): Payer: Medicare Other | Admitting: *Deleted

## 2017-03-19 DIAGNOSIS — Z5181 Encounter for therapeutic drug level monitoring: Secondary | ICD-10-CM | POA: Diagnosis not present

## 2017-03-19 DIAGNOSIS — Z7901 Long term (current) use of anticoagulants: Secondary | ICD-10-CM

## 2017-03-19 DIAGNOSIS — I4891 Unspecified atrial fibrillation: Secondary | ICD-10-CM

## 2017-03-19 LAB — POCT INR: INR: 2.5

## 2017-03-30 ENCOUNTER — Other Ambulatory Visit: Payer: Self-pay | Admitting: Cardiology

## 2017-04-16 ENCOUNTER — Ambulatory Visit (INDEPENDENT_AMBULATORY_CARE_PROVIDER_SITE_OTHER): Payer: Medicare Other | Admitting: Pharmacist

## 2017-04-16 DIAGNOSIS — Z7901 Long term (current) use of anticoagulants: Secondary | ICD-10-CM | POA: Diagnosis not present

## 2017-04-16 DIAGNOSIS — Z5181 Encounter for therapeutic drug level monitoring: Secondary | ICD-10-CM | POA: Diagnosis not present

## 2017-04-16 DIAGNOSIS — I4891 Unspecified atrial fibrillation: Secondary | ICD-10-CM | POA: Diagnosis not present

## 2017-04-16 LAB — POCT INR: INR: 2.3

## 2017-04-27 DIAGNOSIS — D0439 Carcinoma in situ of skin of other parts of face: Secondary | ICD-10-CM | POA: Diagnosis not present

## 2017-04-27 DIAGNOSIS — L570 Actinic keratosis: Secondary | ICD-10-CM | POA: Diagnosis not present

## 2017-04-27 DIAGNOSIS — Z85820 Personal history of malignant melanoma of skin: Secondary | ICD-10-CM | POA: Diagnosis not present

## 2017-04-27 DIAGNOSIS — L821 Other seborrheic keratosis: Secondary | ICD-10-CM | POA: Diagnosis not present

## 2017-04-27 DIAGNOSIS — L578 Other skin changes due to chronic exposure to nonionizing radiation: Secondary | ICD-10-CM | POA: Diagnosis not present

## 2017-05-09 ENCOUNTER — Other Ambulatory Visit: Payer: Self-pay | Admitting: Cardiology

## 2017-05-21 NOTE — Progress Notes (Signed)
      HPI: FU atrial fibrillation. Nuclear study in April of 2014 showed an ejection fraction of 84% and no ischemia or infarction. Holter monitor in October 2014 showed atrial fibrillation with controlled ventricular response. Echocardiogram July 2016 showed normal LV systolic function, mild aortic stenosis with mean gradient 17 mmHg, biatrial enlargement and mild mitral regurgitation. Since I saw him patient denies dyspnea, chest pain, palpitations, syncope or bleeding.  Current Outpatient Prescriptions  Medication Sig Dispense Refill  . allopurinol (ZYLOPRIM) 100 MG tablet Take 100 mg by mouth daily.    . benazepril (LOTENSIN) 5 MG tablet TAKE 1 TABLET DAILY 90 tablet 2  . diltiazem (CARDIZEM CD) 120 MG 24 hr capsule Take 1 capsule (120 mg total) by mouth daily. Schedule appointment for further refills 30 capsule 0  . Linaclotide (LINZESS) 145 MCG CAPS capsule Take 290 mcg by mouth daily.     . Polyethylene Glycol 3350 (MIRALAX PO) Take by mouth daily.    Marland Kitchen warfarin (COUMADIN) 5 MG tablet TAKE ONE TO ONE AND ONE-HALF TABLETS DAILY AS DIRECTED BY COUMADIN CLINIC 135 tablet 1   No current facility-administered medications for this visit.      Past Medical History:  Diagnosis Date  . Atrial fibrillation ([place])   . Hypertension   . Skin cancer     Past Surgical History:  Procedure Laterality Date  . Cataracts      Social History   Social History  . Marital status: Widowed    Spouse name: N/A  . Number of children: 3  . Years of education: N/A   Occupational History  . Not on file.   Social History Main Topics  . Smoking status: Former Smoker    Packs/day: 3.00    Types: Cigarettes    Start date: 06/22/1949    Quit date: 06/22/1969  . Smokeless tobacco: Never Used  . Alcohol use Yes     Comment: Occasional  . Drug use: Unknown  . Sexual activity: Not on file   Other Topics Concern  . Not on file   Social History Narrative  . No narrative on file    Family  History  Problem Relation Age of Onset  . Heart disease Unknown        No family history    ROS: no fevers or chills, productive cough, hemoptysis, dysphasia, odynophagia, melena, hematochezia, dysuria, hematuria, rash, seizure activity, orthopnea, PND, pedal edema, claudication. Remaining systems are negative.  Physical Exam: Well-developed frail in no acute distress.  Skin is warm and dry.  HEENT is normal.  Neck is supple.  Chest is clear to auscultation with normal expansion.  Cardiovascular exam is irregular, 3/6 systolic mumur Abdominal exam nontender or distended. No masses palpated. Extremities show no edema. neuro grossly intact  ECG- atrial fibrillation at a rate of 94. Occasional PVC or aberrantly conducted beat. Cannot rule out prior septal infarct. Nonspecific ST changes. personally reviewed  A/P  1 Permanent atrial fibrillation-continue Cardizem for rate control. Continue Coumadin. Check hemoglobin. Patient does not want to pursue NOACS.  2 hypertension-blood pressure is controlled. Continue present medications. Check potassium and renal function.  3 aortic stenosis-plan repeat echocardiogram.  Kirk Ruths, MD

## 2017-05-24 ENCOUNTER — Ambulatory Visit: Payer: Medicare Other | Admitting: Cardiology

## 2017-05-25 ENCOUNTER — Ambulatory Visit (INDEPENDENT_AMBULATORY_CARE_PROVIDER_SITE_OTHER): Payer: Medicare Other | Admitting: Cardiology

## 2017-05-25 ENCOUNTER — Encounter: Payer: Self-pay | Admitting: Cardiology

## 2017-05-25 VITALS — BP 124/72 | HR 94 | Ht 67.0 in | Wt 138.0 lb

## 2017-05-25 DIAGNOSIS — I350 Nonrheumatic aortic (valve) stenosis: Secondary | ICD-10-CM

## 2017-05-25 NOTE — Patient Instructions (Signed)
Medication Instructions:   NO CHANGE  Labwork:  Your physician recommends that you HAVE LAB WORK TODAY  Testing/Procedures:  Your physician has requested that you have an echocardiogram. Echocardiography is a painless test that uses sound waves to create images of your heart. It provides your doctor with information about the size and shape of your heart and how well your heart's chambers and valves are working. This procedure takes approximately one hour. There are no restrictions for this procedure.    Follow-Up:  Your physician wants you to follow-up in: ONE YEAR WITH DR CRENSHAW You will receive a reminder letter in the mail two months in advance. If you don't receive a letter, please call our office to schedule the follow-up appointment.   If you need a refill on your cardiac medications before your next appointment, please call your pharmacy.    

## 2017-05-26 LAB — CBC
Hematocrit: 38.5 % (ref 37.5–51.0)
Hemoglobin: 13.2 g/dL (ref 13.0–17.7)
MCH: 32.8 pg (ref 26.6–33.0)
MCHC: 34.3 g/dL (ref 31.5–35.7)
MCV: 96 fL (ref 79–97)
PLATELETS: 134 10*3/uL — AB (ref 150–379)
RBC: 4.03 x10E6/uL — AB (ref 4.14–5.80)
RDW: 15.9 % — ABNORMAL HIGH (ref 12.3–15.4)
WBC: 7 10*3/uL (ref 3.4–10.8)

## 2017-05-26 LAB — BASIC METABOLIC PANEL
BUN / CREAT RATIO: 34 — AB (ref 10–24)
BUN: 44 mg/dL — AB (ref 8–27)
CHLORIDE: 110 mmol/L — AB (ref 96–106)
CO2: 21 mmol/L (ref 20–29)
CREATININE: 1.3 mg/dL — AB (ref 0.76–1.27)
Calcium: 9.8 mg/dL (ref 8.6–10.2)
GFR, EST AFRICAN AMERICAN: 56 mL/min/{1.73_m2} — AB (ref >59)
GFR, EST NON AFRICAN AMERICAN: 49 mL/min/{1.73_m2} — AB (ref >59)
Glucose: 97 mg/dL (ref 65–99)
Potassium: 4.9 mmol/L (ref 3.5–5.2)
Sodium: 142 mmol/L (ref 134–144)

## 2017-05-27 ENCOUNTER — Ambulatory Visit (HOSPITAL_COMMUNITY): Payer: Medicare Other | Attending: Internal Medicine

## 2017-05-27 ENCOUNTER — Other Ambulatory Visit: Payer: Self-pay

## 2017-05-27 DIAGNOSIS — I4891 Unspecified atrial fibrillation: Secondary | ICD-10-CM | POA: Diagnosis not present

## 2017-05-27 DIAGNOSIS — I350 Nonrheumatic aortic (valve) stenosis: Secondary | ICD-10-CM | POA: Diagnosis not present

## 2017-05-27 DIAGNOSIS — I10 Essential (primary) hypertension: Secondary | ICD-10-CM | POA: Insufficient documentation

## 2017-05-27 DIAGNOSIS — I083 Combined rheumatic disorders of mitral, aortic and tricuspid valves: Secondary | ICD-10-CM | POA: Diagnosis not present

## 2017-05-28 ENCOUNTER — Ambulatory Visit (INDEPENDENT_AMBULATORY_CARE_PROVIDER_SITE_OTHER): Payer: Medicare Other | Admitting: *Deleted

## 2017-05-28 ENCOUNTER — Telehealth: Payer: Self-pay | Admitting: *Deleted

## 2017-05-28 DIAGNOSIS — I4891 Unspecified atrial fibrillation: Secondary | ICD-10-CM

## 2017-05-28 DIAGNOSIS — Z7901 Long term (current) use of anticoagulants: Secondary | ICD-10-CM

## 2017-05-28 DIAGNOSIS — Z5181 Encounter for therapeutic drug level monitoring: Secondary | ICD-10-CM

## 2017-05-28 LAB — POCT INR: INR: 4.2

## 2017-05-28 NOTE — Telephone Encounter (Addendum)
-----   Message from Lelon Perla, MD sent at 05/27/2017  9:56 PM EDT ----- As moderate to severe, fu study 1 year Kirk Ruths  Increase po fluid intake  Kirk Ruths    Left message for pt to call to discuss echo and lab results

## 2017-06-02 NOTE — Telephone Encounter (Signed)
Left message for pt to call.

## 2017-06-08 ENCOUNTER — Encounter: Payer: Self-pay | Admitting: *Deleted

## 2017-06-08 DIAGNOSIS — L821 Other seborrheic keratosis: Secondary | ICD-10-CM | POA: Diagnosis not present

## 2017-06-08 DIAGNOSIS — L02212 Cutaneous abscess of back [any part, except buttock]: Secondary | ICD-10-CM | POA: Diagnosis not present

## 2017-06-09 NOTE — Telephone Encounter (Signed)
Letter of results sent to pt  

## 2017-06-10 ENCOUNTER — Other Ambulatory Visit: Payer: Self-pay | Admitting: Cardiology

## 2017-06-11 ENCOUNTER — Ambulatory Visit (INDEPENDENT_AMBULATORY_CARE_PROVIDER_SITE_OTHER): Payer: Medicare Other | Admitting: *Deleted

## 2017-06-11 DIAGNOSIS — I4891 Unspecified atrial fibrillation: Secondary | ICD-10-CM | POA: Diagnosis not present

## 2017-06-11 DIAGNOSIS — Z7901 Long term (current) use of anticoagulants: Secondary | ICD-10-CM

## 2017-06-11 DIAGNOSIS — Z5181 Encounter for therapeutic drug level monitoring: Secondary | ICD-10-CM

## 2017-06-11 LAB — POCT INR: INR: 1.9

## 2017-06-24 ENCOUNTER — Ambulatory Visit (INDEPENDENT_AMBULATORY_CARE_PROVIDER_SITE_OTHER): Payer: Medicare Other | Admitting: *Deleted

## 2017-06-24 DIAGNOSIS — I4891 Unspecified atrial fibrillation: Secondary | ICD-10-CM | POA: Diagnosis not present

## 2017-06-24 DIAGNOSIS — Z5181 Encounter for therapeutic drug level monitoring: Secondary | ICD-10-CM

## 2017-06-24 LAB — POCT INR: INR: 2

## 2017-07-05 DIAGNOSIS — Z0000 Encounter for general adult medical examination without abnormal findings: Secondary | ICD-10-CM | POA: Diagnosis not present

## 2017-07-05 DIAGNOSIS — G301 Alzheimer's disease with late onset: Secondary | ICD-10-CM | POA: Diagnosis not present

## 2017-07-05 DIAGNOSIS — K5909 Other constipation: Secondary | ICD-10-CM | POA: Diagnosis not present

## 2017-07-05 DIAGNOSIS — Z79899 Other long term (current) drug therapy: Secondary | ICD-10-CM | POA: Diagnosis not present

## 2017-07-05 DIAGNOSIS — F0280 Dementia in other diseases classified elsewhere without behavioral disturbance: Secondary | ICD-10-CM | POA: Diagnosis not present

## 2017-07-05 DIAGNOSIS — I110 Hypertensive heart disease with heart failure: Secondary | ICD-10-CM | POA: Diagnosis not present

## 2017-07-05 DIAGNOSIS — M109 Gout, unspecified: Secondary | ICD-10-CM | POA: Diagnosis not present

## 2017-07-19 ENCOUNTER — Ambulatory Visit (INDEPENDENT_AMBULATORY_CARE_PROVIDER_SITE_OTHER): Payer: Medicare Other

## 2017-07-19 DIAGNOSIS — Z5181 Encounter for therapeutic drug level monitoring: Secondary | ICD-10-CM

## 2017-07-19 DIAGNOSIS — I4891 Unspecified atrial fibrillation: Secondary | ICD-10-CM | POA: Diagnosis not present

## 2017-07-19 LAB — POCT INR: INR: 2.5

## 2017-08-16 ENCOUNTER — Ambulatory Visit (INDEPENDENT_AMBULATORY_CARE_PROVIDER_SITE_OTHER): Payer: Medicare Other | Admitting: *Deleted

## 2017-08-16 DIAGNOSIS — I4891 Unspecified atrial fibrillation: Secondary | ICD-10-CM | POA: Diagnosis not present

## 2017-08-16 DIAGNOSIS — Z5181 Encounter for therapeutic drug level monitoring: Secondary | ICD-10-CM | POA: Diagnosis not present

## 2017-08-16 LAB — POCT INR: INR: 3

## 2017-08-16 NOTE — Progress Notes (Signed)
Continue taking 1.5 tablets everyday except 1 tablets on Mondays and Fridays. Recheck in 4 weeks.

## 2017-08-23 DIAGNOSIS — R410 Disorientation, unspecified: Secondary | ICD-10-CM | POA: Diagnosis not present

## 2017-08-23 DIAGNOSIS — Z87891 Personal history of nicotine dependence: Secondary | ICD-10-CM | POA: Diagnosis not present

## 2017-08-23 DIAGNOSIS — R4182 Altered mental status, unspecified: Secondary | ICD-10-CM | POA: Diagnosis not present

## 2017-08-23 DIAGNOSIS — N390 Urinary tract infection, site not specified: Secondary | ICD-10-CM | POA: Diagnosis not present

## 2017-08-23 DIAGNOSIS — I517 Cardiomegaly: Secondary | ICD-10-CM | POA: Diagnosis not present

## 2017-08-30 DIAGNOSIS — I110 Hypertensive heart disease with heart failure: Secondary | ICD-10-CM | POA: Diagnosis not present

## 2017-08-30 DIAGNOSIS — M159 Polyosteoarthritis, unspecified: Secondary | ICD-10-CM | POA: Diagnosis not present

## 2017-08-30 DIAGNOSIS — G301 Alzheimer's disease with late onset: Secondary | ICD-10-CM | POA: Diagnosis not present

## 2017-08-30 DIAGNOSIS — M109 Gout, unspecified: Secondary | ICD-10-CM | POA: Diagnosis not present

## 2017-08-30 DIAGNOSIS — Z23 Encounter for immunization: Secondary | ICD-10-CM | POA: Diagnosis not present

## 2017-09-16 ENCOUNTER — Ambulatory Visit (INDEPENDENT_AMBULATORY_CARE_PROVIDER_SITE_OTHER): Payer: Medicare Other | Admitting: Pharmacist

## 2017-09-16 DIAGNOSIS — Z5181 Encounter for therapeutic drug level monitoring: Secondary | ICD-10-CM

## 2017-09-16 DIAGNOSIS — I4891 Unspecified atrial fibrillation: Secondary | ICD-10-CM

## 2017-09-16 LAB — POCT INR: INR: 4.7

## 2017-09-16 NOTE — Patient Instructions (Signed)
Description   No Coumadin today then only 1/2 tablet tomorrow then change dose to 1.5 tablets everyday except 1 tablets on Mondays Wednesdays and Fridays. Recheck in 2 weeks. Call [phone] with any new medications.

## 2017-09-29 ENCOUNTER — Encounter: Payer: Self-pay | Admitting: Neurology

## 2017-09-29 ENCOUNTER — Ambulatory Visit (INDEPENDENT_AMBULATORY_CARE_PROVIDER_SITE_OTHER): Payer: Medicare Other | Admitting: Neurology

## 2017-09-29 VITALS — BP 124/60 | HR 59 | Ht 66.0 in | Wt 139.0 lb

## 2017-09-29 DIAGNOSIS — F0280 Dementia in other diseases classified elsewhere without behavioral disturbance: Secondary | ICD-10-CM | POA: Diagnosis not present

## 2017-09-29 DIAGNOSIS — E538 Deficiency of other specified B group vitamins: Secondary | ICD-10-CM

## 2017-09-29 DIAGNOSIS — G309 Alzheimer's disease, unspecified: Secondary | ICD-10-CM

## 2017-09-29 DIAGNOSIS — R413 Other amnesia: Secondary | ICD-10-CM

## 2017-09-29 DIAGNOSIS — G301 Alzheimer's disease with late onset: Secondary | ICD-10-CM | POA: Diagnosis not present

## 2017-09-29 MED ORDER — DONEPEZIL HCL 10 MG PO TABS: 10.0000 mg | ORAL_TABLET | Freq: Every day | ORAL | 3 refills | Status: AC

## 2017-09-29 NOTE — Progress Notes (Signed)
[facility]    Provider:  Dr Jaynee Eagles Referring Provider: Cyndy Freeze, MD Primary Care Physician:  Cyndy Freeze, MD  CC:  Memory loss  HPI:  Peter Moreno is a 83 y.o. male here as a referral from Dr. Cathi Roan for memory loss.  Past medical history memory loss, osteoarthritis, atrial fibrillation on warfarin, late onset Alzheimer's disease without behavioral disturbance, hypertensive heart disease, gout. Son is here and provides most information. 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 Short term memory is worsening. More confusion, worsening, having difficulty putting clothes on which is new recently. He is always cold and that complicates urination problems, he has been having more confusion with the mechanics of using the bathroom actually dropping his pants. Memory declining slowly for 4 years and accelerating in the last year. Treating the UTI made the acute changes of confusion urinating better but still continues to decline and he still continues to have difficulty with ADLs and dressing.  He has difficulty choosing clothes and appropriate clothes. He moved from [place] 4 years ago. He gave up his car when he moved here, he was seeing double. He has difficulty with recall, he forgets his words, forgets names but may remember in 10 minutes. He gets frustrated with his memory changes. Daughter-in-law ans son helps with medication because he was having difficulty talking own medications. He has decreased dexterity and difficulty opening pills. He remembers his medications, they deny he would forget to take them. Son manages finances, but he knows how much money is in his wallet, he can handle money on his own. Doesn't report stories or ask the same questions in the same day, doesn't forget dates. Mother with Alzheimers.   Reviewed notes, labs and imaging from outside physicians, which showed:   Reviewed referring physician notes.   The onset of memory impairment has been acute has been occurring in intermittent pattern for weeks.  Course has been gradually worsening.  The memory impairment is characterized as partial and a loss of recent memory.  No associated alcoholism, anxiety, ataxia, cerebrovascular disease, chorea, confabulation, depression, drug addiction, epilepsy, fever, focal neurologic deficits, history of encephalitis, history of meningitis, headache, impaired consciousness, intellectual deterioration, nausea and neck stiffness rigidity trauma.  Patient was at home and he forgot how to set his alarm.  He does have hearing issues, he knocked the charger off and had put the charger upside down and could not get it figured out, noted some loss of short-term memory but good remote recall, also has some vision problems initially reported over a year ago last update July 05, 2017 and it appears son has been managing his medications with improvement in compliance, having difficulty with ADLs i.e. putting on close etc. he was found to have a UTI after he went to the emergency room.  CBC ordered July 05, 2017, showed hemoglobin 13.7, hematocrit 39.8 MCV 95, platelets 121, CMP showed a BUN 41 and creatinine 1.27 with a BUN/creatinine ratio of 32 and a GFR non-African-American of 57.  Review of Systems: Patient complains of symptoms per HPI as well as the following symptoms: fatigue, memory loss, confusion, dizziness, anxiety. Pertinent negatives and positives per HPI. All others negative.   Social History   Socioeconomic History  . Marital status: Widowed    Spouse name: Not on file  . Number of children: 3  . Years of education: Not on file  . Highest education level: Not on file  Social Needs  .  Financial resource strain: Not on file  . Food insecurity - worry: Not on file  . Food insecurity - inability: Not on file  . Transportation needs - medical: Not on file  . Transportation needs - non-medical: Not on file    Occupational History  . Not on file  Tobacco Use  . Smoking status: Former Smoker    Packs/day: 3.00    Types: Cigarettes    Start date: 06/22/1949    Last attempt to quit: 06/22/1969    Years since quitting: 48.3  . Smokeless tobacco: Never Used  Substance and Sexual Activity  . Alcohol use: Yes    Comment: Occasional  . Drug use: Not on file  . Sexual activity: Not on file  Other Topics Concern  . Not on file  Social History Narrative  . Not on file    Family History  Problem Relation Age of Onset  . Dementia Mother   . Heart disease Unknown        No family history    Past Medical History:  Diagnosis Date  . Atrial fibrillation ([place])   . Hypertension   . Skin cancer     Past Surgical History:  Procedure Laterality Date  . Cataracts      Current Outpatient Medications  Medication Sig Dispense Refill  . allopurinol (ZYLOPRIM) 100 MG tablet Take 100 mg by mouth daily.    . benazepril (LOTENSIN) 5 MG tablet TAKE 1 TABLET DAILY 90 tablet 2  . diltiazem (CARDIZEM CD) 120 MG 24 hr capsule Take 1 capsule (120 mg total) by mouth daily. Schedule appointment for further refills 30 capsule 0  . Linaclotide (LINZESS) 145 MCG CAPS capsule Take 290 mcg by mouth daily.     . Polyethylene Glycol 3350 (MIRALAX PO) Take by mouth daily.    Marland Kitchen warfarin (COUMADIN) 5 MG tablet TAKE ONE TO ONE AND ONE-HALF TABLETS DAILY AS DIRECTED BY COUMADIN CLINIC 135 tablet 1  . donepezil (ARICEPT) 10 MG tablet Take 1 tablet (10 mg total) by mouth at bedtime. 30 tablet 3   No current facility-administered medications for this visit.     Allergies as of 09/29/2017 - Review Complete 09/29/2017  Allergen Reaction Noted  . Penicillins Rash 06/22/2013    Vitals: BP 124/60   Pulse (!) 59   Ht 5\' 6"  (1.676 m)   Wt 139 lb (63 kg)   BMI 22.44 kg/m  Last Weight:  Wt Readings from Last 1 Encounters:  09/29/17 139 lb (63 kg)   Last Height:   Ht Readings from Last 1 Encounters:  09/29/17 5\' 6"   (1.676 m)   Physical exam: Exam: Gen: NAD, conversant, well nourised, thin, frail, pleasant            CV: +Murmur, RRR. No Carotid Bruits. No peripheral edema, warm, nontender Eyes: Conjunctivae clear without exudates or hemorrhage  Neuro: Detailed Neurologic Exam  Speech:    Speech is normal; fluent and spontaneous with normal comprehension.  Cognition: MMSE - Mini Mental State Exam 09/29/2017  Orientation to time 5  Orientation to Place 3  Registration 2  Attention/ Calculation 2  Recall 2  Language- name 2 objects 2  Language- repeat 1  Language- follow 3 step command 3  Language- read & follow direction 1  Write a sentence 1  Copy design 0  Total score 22   Cranial Nerves:    The pupils are equal, round, and reactive to light. Attempted fundoscopic exam could not visualize due  to small pupils.  Visual fields are full to finger confrontation. Extraocular movements are intact. Trigeminal sensation is intact and the muscles of mastication are normal. The face is symmetric. The palate elevates in the midline. Hearing impaired. Voice is normal. Shoulder shrug is normal. The tongue has normal motion without fasciculations.   Coordination:    No dysmetria  Gait:    Antalgic with a cane, stooped  Motor Observation:    Generalized atrophy Tone:    Normal muscle tone.      Strength:    Strength is V/V in the upper and lower limbs.      Sensation: intact to LT     Reflex Exam:  DTR's:    Deep tendon reflexes in the upper and lower extremities are symmetrical bilaterally.   Toes:    The toes are equiv bilaterally.   Clonus:    Clonus is absent.         Assessment/Plan: This is a pleasant 83 year old male here for discussion of memory problems likely mild, late onset Alzheimer's type.  However he is quite functional and pleasant, no mood disorders, appears very well cared for by his family.  Discussed full workup, following clinically, will start Aricept however  need to watch for bradycardia and other arrhythmias will contact his cardiologist Dr. Stanford Breed via mail and also forward this note.  Start aricept, will inform Dr. Stanford Breed his cardiologist, Discussed cardiac effects of Aricept, monitor blood pressure and pulse daily and stop for any side effects  At next appointment can discuss memantine if indicated Fall risk, discussed fall precautions Will order imaging and labs to evaluate for reversible causes of dementia Follow clinically  Orders Placed This Encounter  Procedures  . MR BRAIN WO CONTRAST  . B12 and Folate Panel  . Methylmalonic acid, serum  . RPR  . Homocysteine  . TSH    Cc: Cyndy Freeze, MD, Dr. Kirk Ruths  Sarina Ill, MD  [facility] [address] [place], [place] [address]  Phone [phone] Fax [phone]

## 2017-09-29 NOTE — Patient Instructions (Addendum)
Start Donepezil 1/2 tablet daily and in 2 weeks if no side effects then increase to a whole tablet At next appointment can consider Memantine which is another medication to help slow down memory loss MRI brain and labs  Donepezil tablets What is this medicine? DONEPEZIL (doe NEP e zil) is used to treat mild to moderate dementia caused by Alzheimer's disease. This medicine may be used for other purposes; ask your health care provider or pharmacist if you have questions. COMMON BRAND NAME(S): Aricept What should I tell my health care provider before I take this medicine? They need to know if you have any of these conditions: -asthma or other lung disease -difficulty passing urine -head injury -heart disease -history of irregular heartbeat -liver disease -seizures (convulsions) -stomach or intestinal disease, ulcers or stomach bleeding -an unusual or allergic reaction to donepezil, other medicines, foods, dyes, or preservatives -pregnant or trying to get pregnant -breast-feeding How should I use this medicine? Take this medicine by mouth with a glass of water. Follow the directions on the prescription label. You may take this medicine with or without food. Take this medicine at regular intervals. This medicine is usually taken before bedtime. Do not take it more often than directed. Continue to take your medicine even if you feel better. Do not stop taking except on your doctor's advice. If you are taking the 23 mg donepezil tablet, swallow it whole; do not cut, crush, or chew it. Talk to your pediatrician regarding the use of this medicine in children. Special care may be needed. Overdosage: If you think you have taken too much of this medicine contact a poison control center or emergency room at once. NOTE: This medicine is only for you. Do not share this medicine with others. What if I miss a dose? If you miss a dose, take it as soon as you can. If it is almost time for your next dose,  take only that dose, do not take double or extra doses. What may interact with this medicine? Do not take this medicine with any of the following medications: -certain medicines for fungal infections like itraconazole, fluconazole, posaconazole, and voriconazole -cisapride -dextromethorphan; quinidine -dofetilide -dronedarone -pimozide -quinidine -thioridazine -ziprasidone This medicine may also interact with the following medications: -antihistamines for allergy, cough and cold -atropine -bethanechol -carbamazepine -certain medicines for bladder problems like oxybutynin, tolterodine -certain medicines for Parkinson's disease like benztropine, trihexyphenidyl -certain medicines for stomach problems like dicyclomine, hyoscyamine -certain medicines for travel sickness like scopolamine -dexamethasone -ipratropium -NSAIDs, medicines for pain and inflammation, like ibuprofen or naproxen -other medicines for Alzheimer's disease -other medicines that prolong the QT interval (cause an abnormal heart rhythm) -phenobarbital -phenytoin -rifampin, rifabutin or rifapentine This list may not describe all possible interactions. Give your health care provider a list of all the medicines, herbs, non-prescription drugs, or dietary supplements you use. Also tell them if you smoke, drink alcohol, or use illegal drugs. Some items may interact with your medicine. What should I watch for while using this medicine? Visit your doctor or health care professional for regular checks on your progress. Check with your doctor or health care professional if your symptoms do not get better or if they get worse. You may get drowsy or dizzy. Do not drive, use machinery, or do anything that needs mental alertness until you know how this drug affects you. What side effects may I notice from receiving this medicine? Side effects that you should report to your doctor or health care professional as  soon as  possible: -allergic reactions like skin rash, itching or hives, swelling of the face, lips, or tongue -feeling faint or lightheaded, falls -loss of bladder control -seizures -signs and symptoms of a dangerous change in heartbeat or heart rhythm like chest pain; dizziness; fast or irregular heartbeat; palpitations; feeling faint or lightheaded, falls; breathing problems -signs and symptoms of infection like fever or chills; cough; sore throat; pain or trouble passing urine -signs and symptoms of liver injury like dark yellow or brown urine; general ill feeling or flu-like symptoms; light-colored stools; loss of appetite; nausea; right upper belly pain; unusually weak or tired; yellowing of the eyes or skin -slow heartbeat or palpitations -unusual bleeding or bruising -vomiting Side effects that usually do not require medical attention (report to your doctor or health care professional if they continue or are bothersome): -diarrhea, especially when starting treatment -headache -loss of appetite -muscle cramps -nausea -stomach upset This list may not describe all possible side effects. Call your doctor for medical advice about side effects. You may report side effects to FDA at 1-800-FDA-1088. Where should I keep my medicine? Keep out of reach of children. Store at room temperature between 15 and 30 degrees C (59 and 86 degrees F). Throw away any unused medicine after the expiration date. NOTE: This sheet is a summary. It may not cover all possible information. If you have questions about this medicine, talk to your doctor, pharmacist, or health care provider.  2018 Elsevier/Gold Standard (2016-03-09 21:00:42)   Alzheimer Disease Alzheimer disease is a brain disease that affects memory, thinking, and behavior. People with Alzheimer disease lose mental abilities, and the disease gets worse over time. Survival with Alzheimer disease ranges from several years to as long as 20 years. What are  the causes? This condition develops when a protein called beta-amyloid forms deposits in the brain. It is not known what causes these deposits to form. What increases the risk? This condition is more likely to develop in people who:  Are elderly.  Have a family history of dementia.  Have had a brain injury.  Have heart or blood vessel disease.  Have had a stroke.  Have high blood pressure or high cholesterol.  Have diabetes.  What are the signs or symptoms? Symptoms of this condition happen in three stages, which often overlap. Early stage In this stage, you may continue to be independent. You may still be able to drive, work, and be social. Symptoms in this stage include:  Minor memory problems, such as forgetting a name or what you read.  Difficulty with: ? Paying attention. ? Communicating. ? Doing familiar tasks. ? Learning new things.  Needing more time to do daily activities.  Anxiety.  Social withdrawal.  Loss of motivation.  Moderate stage In this stage, you will start to need care. This stage usually lasts the longest. Symptoms in this stage include:  Difficulty with expressing thoughts.  Memory loss that affects daily life. This can include forgetting: ? Your address or phone number. ? Events that have happened. ? Parts of your personal history, like where you went to school.  Confusion about where you are or what time it is.  Difficulty in judging distance.  Changes in personality, mood, and behavior. You may be moody, irritable, angry, frustrated, fearful, anxious, or suspicious.  Poor reasoning and judgment.  Delusions or hallucinations.  Changes in sleep patterns.  Wandering and getting lost.  Severe stage In the final stage, you will need help with your  personal care and dailyactivities. Symptoms in this stage include:  Worsening memory loss.  Personality changes.  Loss of awareness of your surroundings.  Changes in physical  abilities, including the ability to walk, sit, and swallow.  Difficulty in communicating.  Inability to control the bladder and bowels.  Increasing confusion.  Increasing disruptive behavior.  How is this diagnosed? This condition is diagnosed with an assessment by your health care provider. During this assessment, your health care provider will talk with you and your family, friends, or caregivers about your symptoms. A thorough medical history will be taken, and you will have a physical exam and tests. Tests may include:  Lab tests, such as blood or urine tests.  Imaging tests, such as a CT scan, PET scan, or MRI.  A lumbar puncture. This test involves removing and testing a small amount of the fluid that surrounds the brain and spinal cord.  An electroencephalogram (EEG). In this test, small metal discs are used to measure electrical activity in the brain.  Memory tests, cognitive tests, and neuropsychological tests. These tests evaluate brain function.  How is this treated? At this time, there is no treatment to cure Alzheimer disease or stop it from getting worse. The goals of treatment are:  To slow down the disease.  To manage behavioral problems.  To provide you with a safe environment.  To make life easier for you and your caregivers.  The following treatment options are available:  Medicines. Medicines may help to slow down memory loss and control behavioral symptoms.  Talk therapy. Talk therapy provides you with education, support, and memory aids. It is most helpful in the early stages of the condition.  Counseling or spiritual guidance. It is normal to have a lot of feelings, including anger, relief, fear, and isolation. Counseling and guidance can help you deal with these feelings.  Caregiving. This involves having caregivers help you with your daily activities. Caregivers may be family members, friends, or trained medical professionals. Caregiving can be done  at home or outside the home.  Family support groups. These provide education, emotional support, and information about community resources to family members who are taking care of you.  Follow these instructions at home: Medicines  Take over-the-counter and prescription medicines only as told by your health care provider.  Avoid taking medicines that can affect thinking, such as pain or sleeping medicines. Lifestyle   Make healthy lifestyle choices: ? Be physically active as told by your health care provider. ? Do not use any tobacco products, such as cigarettes, chewing tobacco, and e-cigarettes. If you need help quitting, ask your health care provider. ? Eat a healthy diet. ? Practice stress-management techniques when you get stressed. ? Stay social.  Drink enough fluid to keep your urine clear or pale yellow.  Make sure to get quality sleep. These tips can help you get a good night's rest: ? Avoid napping during the day. ? Keep your sleeping area dark and cool. ? Avoid exercising during the few hours before you go to bed. ? Avoid caffeine products in the evening. General instructions  Work with your health care provider to determine what you need help with and what your safety needs are.  If you were given a bracelet that tracks your location, make sure to wear it.  Keep all follow-up visits as told by your health care provider. This is important.  If you have questions or would like additional support, you may contact The [employer]: ?  24-hour helpline: 816-873-6538 ? Website: [employer] Contact a health care provider if:  You have nausea, vomiting, or trouble with eating.  You have dizziness, or weakness.  You have new or worsening trouble with sleeping.  You or your family members become concerned for your safety. Get help right away if:  You develop chest pain or difficulty with breathing.  You pass out. This information is not intended to  replace advice given to you by your health care provider. Make sure you discuss any questions you have with your health care provider. Document Released: 06/02/2004 Document Revised: 05/22/2016 Document Reviewed: 06/19/2015 Elsevier Interactive Patient Education  [employer].

## 2017-09-30 ENCOUNTER — Ambulatory Visit (INDEPENDENT_AMBULATORY_CARE_PROVIDER_SITE_OTHER): Payer: Medicare Other | Admitting: *Deleted

## 2017-09-30 DIAGNOSIS — Z5181 Encounter for therapeutic drug level monitoring: Secondary | ICD-10-CM | POA: Diagnosis not present

## 2017-09-30 DIAGNOSIS — I4891 Unspecified atrial fibrillation: Secondary | ICD-10-CM | POA: Diagnosis not present

## 2017-09-30 LAB — POCT INR: INR: 2.4

## 2017-09-30 NOTE — Patient Instructions (Signed)
Description   Continue taking 1.5 tablets everyday except 1 tablets on Mondays Wednesdays and Fridays. Recheck in 3 weeks. Call [phone] with any new medications.

## 2017-10-01 ENCOUNTER — Telehealth: Payer: Self-pay

## 2017-10-01 NOTE — Telephone Encounter (Signed)
-----   Message from Lester [place], RN sent at 09/30/2017  5:03 PM EST -----   ----- Message ----- From: Melvenia Beam, MD Sent: 09/30/2017   4:32 PM To: Lester [place], RN  Patient is significantly hypothyroid, he may need to be on thyroid medication which it does not appear that he is(not on his med list). Please follow up with primary care for treatment. Also,  We can forward the labs to your pcp but please access them on mychart and bring them to appointment Erasmo Downer please forward labs to patient's pcp thank you)

## 2017-10-01 NOTE — Telephone Encounter (Signed)
I left a detailed message with lab results, ok per dpr. I advised patient to call back with any questions or concerns. I also forwarded results to patient's PCP.

## 2017-10-02 LAB — RPR: RPR Ser Ql: NONREACTIVE

## 2017-10-02 LAB — METHYLMALONIC ACID, SERUM: Methylmalonic Acid: 261 nmol/L (ref 0–378)

## 2017-10-02 LAB — TSH: TSH: 9.05 u[IU]/mL — ABNORMAL HIGH (ref 0.450–4.500)

## 2017-10-02 LAB — B12 AND FOLATE PANEL: VITAMIN B 12: 710 pg/mL (ref 232–1245)

## 2017-10-02 LAB — HOMOCYSTEINE: Homocysteine: 19.1 umol/L — ABNORMAL HIGH (ref 0.0–15.0)

## 2017-10-07 ENCOUNTER — Telehealth: Payer: Self-pay | Admitting: Cardiology

## 2017-10-07 ENCOUNTER — Other Ambulatory Visit: Payer: Self-pay

## 2017-10-07 DIAGNOSIS — G301 Alzheimer's disease with late onset: Secondary | ICD-10-CM | POA: Diagnosis not present

## 2017-10-07 DIAGNOSIS — F0280 Dementia in other diseases classified elsewhere without behavioral disturbance: Secondary | ICD-10-CM | POA: Diagnosis not present

## 2017-10-07 DIAGNOSIS — Z6821 Body mass index (BMI) 21.0-21.9, adult: Secondary | ICD-10-CM | POA: Diagnosis not present

## 2017-10-07 DIAGNOSIS — N390 Urinary tract infection, site not specified: Secondary | ICD-10-CM | POA: Diagnosis not present

## 2017-10-07 MED ORDER — DILTIAZEM HCL ER COATED BEADS 120 MG PO CP24: 120.0000 mg | ORAL_CAPSULE | Freq: Every day | ORAL | 0 refills | Status: DC

## 2017-10-07 NOTE — Telephone Encounter (Signed)
New Message    *STAT* If patient is at the pharmacy, call can be transferred to refill team.   1. Which medications need to be refilled? (please list name of each medication and dose if known) diltiazem (CARDIZEM CD) 120 MG 24 hr capsule  2. Which pharmacy/location (including street and city if local pharmacy) is medication to be sent to? [facility] in [place]  3. Do they need a 30 day or 90 day supply? [place]

## 2017-10-08 ENCOUNTER — Telehealth: Payer: Self-pay | Admitting: Cardiology

## 2017-10-08 MED ORDER — DILTIAZEM HCL ER COATED BEADS 120 MG PO CP24: 120.0000 mg | ORAL_CAPSULE | Freq: Every day | ORAL | 12 refills | Status: DC

## 2017-10-08 NOTE — Telephone Encounter (Signed)
Refill sent to the pharmacy electronically.  

## 2017-10-08 NOTE — Telephone Encounter (Signed)
Spoke with pt wife, aware we do not have samples of diltiazem. Refill sent to mail order pharmacy. [facility] will contact [facility] regarding getting it mailed asap. She will check with [facility] about getting a small amount of tablets to last until mail order gets here.

## 2017-10-08 NOTE — Telephone Encounter (Signed)
New message      Patient calling the office for samples of medication:   1.  What medication and dosage are you requesting samples for?  diltiazem (CARDIZEM CD) 120 MG 24 hr capsule Take 1 capsule (120 mg total) by mouth daily. Schedule appointment for further refills    2.  Are you currently out of this medication?   Completely out , insurance will not pay for it , needs samples

## 2017-10-14 ENCOUNTER — Ambulatory Visit
Admission: RE | Admit: 2017-10-14 | Discharge: 2017-10-14 | Disposition: A | Discharge status: Home / Self Care | Payer: Medicare Other | Source: Ambulatory Visit | Attending: Neurology | Admitting: Neurology

## 2017-10-14 DIAGNOSIS — R7989 Other specified abnormal findings of blood chemistry: Secondary | ICD-10-CM | POA: Diagnosis not present

## 2017-10-14 DIAGNOSIS — I10 Essential (primary) hypertension: Secondary | ICD-10-CM | POA: Diagnosis not present

## 2017-10-14 DIAGNOSIS — Z6821 Body mass index (BMI) 21.0-21.9, adult: Secondary | ICD-10-CM | POA: Diagnosis not present

## 2017-10-14 DIAGNOSIS — R413 Other amnesia: Secondary | ICD-10-CM

## 2017-10-14 DIAGNOSIS — Z1331 Encounter for screening for depression: Secondary | ICD-10-CM | POA: Diagnosis not present

## 2017-10-14 DIAGNOSIS — Z79899 Other long term (current) drug therapy: Secondary | ICD-10-CM | POA: Diagnosis not present

## 2017-10-20 ENCOUNTER — Telehealth: Payer: Self-pay | Admitting: *Deleted

## 2017-10-20 NOTE — Telephone Encounter (Signed)
Called Kara Mead (on [place]) and LVM (ok per DPR) informing her that Peter Moreno's MRI brain was unremarkable, no stroke, no masses, no significant findings for his age. I stated that it was essentially unchanged from the last imaging in May 2017. I informed her that a call back was not required but if encouraged to call with any questions. Left office number in message.

## 2017-10-20 NOTE — Telephone Encounter (Signed)
-----   Message from Melvenia Beam, MD sent at 10/18/2017 12:37 PM EST ----- MRI brain unremarkable, no strokes or masses or significant findings for age. Essentially unchanged from last imaging in May 2017. thanks

## 2017-10-21 ENCOUNTER — Ambulatory Visit (INDEPENDENT_AMBULATORY_CARE_PROVIDER_SITE_OTHER): Payer: Medicare Other | Admitting: Pharmacist

## 2017-10-21 DIAGNOSIS — I4891 Unspecified atrial fibrillation: Secondary | ICD-10-CM

## 2017-10-21 DIAGNOSIS — Z5181 Encounter for therapeutic drug level monitoring: Secondary | ICD-10-CM

## 2017-10-21 LAB — POCT INR: INR: 2.5

## 2017-10-21 NOTE — Patient Instructions (Signed)
Description   Continue taking 1.5 tablets everyday except 1 tablets on Mondays Wednesdays and Fridays. Recheck in 4 weeks. Call [phone] with any new medications.

## 2017-10-26 DIAGNOSIS — L570 Actinic keratosis: Secondary | ICD-10-CM | POA: Diagnosis not present

## 2017-10-26 DIAGNOSIS — L578 Other skin changes due to chronic exposure to nonionizing radiation: Secondary | ICD-10-CM | POA: Diagnosis not present

## 2017-10-26 DIAGNOSIS — L821 Other seborrheic keratosis: Secondary | ICD-10-CM | POA: Diagnosis not present

## 2017-10-29 DIAGNOSIS — I10 Essential (primary) hypertension: Secondary | ICD-10-CM | POA: Diagnosis not present

## 2017-10-29 DIAGNOSIS — Z9181 History of falling: Secondary | ICD-10-CM | POA: Diagnosis not present

## 2017-10-29 DIAGNOSIS — I959 Hypotension, unspecified: Secondary | ICD-10-CM | POA: Diagnosis not present

## 2017-10-29 DIAGNOSIS — R7989 Other specified abnormal findings of blood chemistry: Secondary | ICD-10-CM | POA: Diagnosis not present

## 2017-10-29 DIAGNOSIS — R5381 Other malaise: Secondary | ICD-10-CM | POA: Diagnosis not present

## 2017-10-31 DIAGNOSIS — Z87891 Personal history of nicotine dependence: Secondary | ICD-10-CM | POA: Diagnosis not present

## 2017-10-31 DIAGNOSIS — R5381 Other malaise: Secondary | ICD-10-CM | POA: Diagnosis not present

## 2017-10-31 DIAGNOSIS — G450 Vertebro-basilar artery syndrome: Secondary | ICD-10-CM | POA: Diagnosis not present

## 2017-10-31 DIAGNOSIS — Z9181 History of falling: Secondary | ICD-10-CM | POA: Diagnosis not present

## 2017-10-31 DIAGNOSIS — Z7901 Long term (current) use of anticoagulants: Secondary | ICD-10-CM | POA: Diagnosis not present

## 2017-10-31 DIAGNOSIS — J439 Emphysema, unspecified: Secondary | ICD-10-CM | POA: Diagnosis not present

## 2017-10-31 DIAGNOSIS — I129 Hypertensive chronic kidney disease with stage 1 through stage 4 chronic kidney disease, or unspecified chronic kidney disease: Secondary | ICD-10-CM | POA: Diagnosis not present

## 2017-10-31 DIAGNOSIS — R55 Syncope and collapse: Secondary | ICD-10-CM | POA: Diagnosis not present

## 2017-10-31 DIAGNOSIS — I959 Hypotension, unspecified: Secondary | ICD-10-CM | POA: Diagnosis not present

## 2017-10-31 DIAGNOSIS — N183 Chronic kidney disease, stage 3 (moderate): Secondary | ICD-10-CM | POA: Diagnosis not present

## 2017-10-31 DIAGNOSIS — I951 Orthostatic hypotension: Secondary | ICD-10-CM | POA: Diagnosis not present

## 2017-10-31 DIAGNOSIS — Z66 Do not resuscitate: Secondary | ICD-10-CM | POA: Diagnosis not present

## 2017-10-31 DIAGNOSIS — R42 Dizziness and giddiness: Secondary | ICD-10-CM | POA: Diagnosis not present

## 2017-10-31 DIAGNOSIS — I482 Chronic atrial fibrillation: Secondary | ICD-10-CM | POA: Diagnosis not present

## 2017-11-01 DIAGNOSIS — Z66 Do not resuscitate: Secondary | ICD-10-CM | POA: Diagnosis not present

## 2017-11-01 DIAGNOSIS — I959 Hypotension, unspecified: Secondary | ICD-10-CM | POA: Diagnosis not present

## 2017-11-01 DIAGNOSIS — R42 Dizziness and giddiness: Secondary | ICD-10-CM | POA: Diagnosis not present

## 2017-11-01 DIAGNOSIS — Z7901 Long term (current) use of anticoagulants: Secondary | ICD-10-CM | POA: Diagnosis not present

## 2017-11-01 DIAGNOSIS — R55 Syncope and collapse: Secondary | ICD-10-CM | POA: Diagnosis not present

## 2017-11-01 DIAGNOSIS — I482 Chronic atrial fibrillation: Secondary | ICD-10-CM | POA: Diagnosis not present

## 2017-11-03 ENCOUNTER — Ambulatory Visit: Payer: TRICARE For Life (TFL) | Admitting: Neurology

## 2017-11-03 DIAGNOSIS — H109 Unspecified conjunctivitis: Secondary | ICD-10-CM | POA: Diagnosis not present

## 2017-11-03 DIAGNOSIS — N179 Acute kidney failure, unspecified: Secondary | ICD-10-CM | POA: Diagnosis not present

## 2017-11-03 DIAGNOSIS — I9589 Other hypotension: Secondary | ICD-10-CM | POA: Diagnosis not present

## 2017-11-03 DIAGNOSIS — R748 Abnormal levels of other serum enzymes: Secondary | ICD-10-CM | POA: Diagnosis not present

## 2017-11-09 DIAGNOSIS — R2689 Other abnormalities of gait and mobility: Secondary | ICD-10-CM | POA: Diagnosis not present

## 2017-11-09 DIAGNOSIS — R2681 Unsteadiness on feet: Secondary | ICD-10-CM | POA: Diagnosis not present

## 2017-11-09 DIAGNOSIS — R55 Syncope and collapse: Secondary | ICD-10-CM | POA: Diagnosis not present

## 2017-11-09 DIAGNOSIS — R5383 Other fatigue: Secondary | ICD-10-CM | POA: Diagnosis not present

## 2017-11-09 DIAGNOSIS — M6281 Muscle weakness (generalized): Secondary | ICD-10-CM | POA: Diagnosis not present

## 2017-11-11 DIAGNOSIS — R2681 Unsteadiness on feet: Secondary | ICD-10-CM | POA: Diagnosis not present

## 2017-11-11 DIAGNOSIS — R2689 Other abnormalities of gait and mobility: Secondary | ICD-10-CM | POA: Diagnosis not present

## 2017-11-11 DIAGNOSIS — M6281 Muscle weakness (generalized): Secondary | ICD-10-CM | POA: Diagnosis not present

## 2017-11-11 DIAGNOSIS — R5383 Other fatigue: Secondary | ICD-10-CM | POA: Diagnosis not present

## 2017-11-11 DIAGNOSIS — R55 Syncope and collapse: Secondary | ICD-10-CM | POA: Diagnosis not present

## 2017-11-16 DIAGNOSIS — R55 Syncope and collapse: Secondary | ICD-10-CM | POA: Diagnosis not present

## 2017-11-16 DIAGNOSIS — R2681 Unsteadiness on feet: Secondary | ICD-10-CM | POA: Diagnosis not present

## 2017-11-16 DIAGNOSIS — M6281 Muscle weakness (generalized): Secondary | ICD-10-CM | POA: Diagnosis not present

## 2017-11-16 DIAGNOSIS — R5383 Other fatigue: Secondary | ICD-10-CM | POA: Diagnosis not present

## 2017-11-16 DIAGNOSIS — R2689 Other abnormalities of gait and mobility: Secondary | ICD-10-CM | POA: Diagnosis not present

## 2017-11-17 DIAGNOSIS — H109 Unspecified conjunctivitis: Secondary | ICD-10-CM | POA: Diagnosis not present

## 2017-11-17 DIAGNOSIS — Z6820 Body mass index (BMI) 20.0-20.9, adult: Secondary | ICD-10-CM | POA: Diagnosis not present

## 2017-11-17 DIAGNOSIS — I9589 Other hypotension: Secondary | ICD-10-CM | POA: Diagnosis not present

## 2017-11-20 DIAGNOSIS — Z7901 Long term (current) use of anticoagulants: Secondary | ICD-10-CM | POA: Diagnosis not present

## 2017-11-20 DIAGNOSIS — I482 Chronic atrial fibrillation: Secondary | ICD-10-CM | POA: Diagnosis not present

## 2017-11-20 DIAGNOSIS — R2689 Other abnormalities of gait and mobility: Secondary | ICD-10-CM | POA: Diagnosis not present

## 2017-11-20 DIAGNOSIS — I110 Hypertensive heart disease with heart failure: Secondary | ICD-10-CM | POA: Diagnosis not present

## 2017-11-20 DIAGNOSIS — I9589 Other hypotension: Secondary | ICD-10-CM | POA: Diagnosis not present

## 2017-11-20 DIAGNOSIS — Z5181 Encounter for therapeutic drug level monitoring: Secondary | ICD-10-CM | POA: Diagnosis not present

## 2017-11-22 ENCOUNTER — Encounter (HOSPITAL_COMMUNITY): Payer: Self-pay

## 2017-11-22 ENCOUNTER — Emergency Department (HOSPITAL_COMMUNITY): Payer: Medicare Other

## 2017-11-22 ENCOUNTER — Other Ambulatory Visit: Payer: Self-pay

## 2017-11-22 ENCOUNTER — Inpatient Hospital Stay (HOSPITAL_COMMUNITY)
Admission: EM | Admit: 2017-11-22 | Discharge: 2017-12-09 | DRG: 871 | Disposition: A | Discharge status: Hospice | Payer: Medicare Other | Attending: Internal Medicine | Admitting: Internal Medicine

## 2017-11-22 DIAGNOSIS — Z7901 Long term (current) use of anticoagulants: Secondary | ICD-10-CM

## 2017-11-22 DIAGNOSIS — R319 Hematuria, unspecified: Secondary | ICD-10-CM | POA: Diagnosis present

## 2017-11-22 DIAGNOSIS — K045 Chronic apical periodontitis: Secondary | ICD-10-CM | POA: Diagnosis present

## 2017-11-22 DIAGNOSIS — J90 Pleural effusion, not elsewhere classified: Secondary | ICD-10-CM | POA: Diagnosis not present

## 2017-11-22 DIAGNOSIS — J189 Pneumonia, unspecified organism: Secondary | ICD-10-CM | POA: Diagnosis present

## 2017-11-22 DIAGNOSIS — D62 Acute posthemorrhagic anemia: Secondary | ICD-10-CM | POA: Diagnosis not present

## 2017-11-22 DIAGNOSIS — R339 Retention of urine, unspecified: Secondary | ICD-10-CM | POA: Diagnosis not present

## 2017-11-22 DIAGNOSIS — G9341 Metabolic encephalopathy: Secondary | ICD-10-CM | POA: Diagnosis not present

## 2017-11-22 DIAGNOSIS — R7881 Bacteremia: Secondary | ICD-10-CM

## 2017-11-22 DIAGNOSIS — G309 Alzheimer's disease, unspecified: Secondary | ICD-10-CM | POA: Diagnosis present

## 2017-11-22 DIAGNOSIS — I959 Hypotension, unspecified: Secondary | ICD-10-CM | POA: Diagnosis present

## 2017-11-22 DIAGNOSIS — I272 Pulmonary hypertension, unspecified: Secondary | ICD-10-CM | POA: Diagnosis present

## 2017-11-22 DIAGNOSIS — K029 Dental caries, unspecified: Secondary | ICD-10-CM | POA: Diagnosis present

## 2017-11-22 DIAGNOSIS — F0280 Dementia in other diseases classified elsewhere without behavioral disturbance: Secondary | ICD-10-CM | POA: Diagnosis not present

## 2017-11-22 DIAGNOSIS — Z79899 Other long term (current) drug therapy: Secondary | ICD-10-CM

## 2017-11-22 DIAGNOSIS — D72829 Elevated white blood cell count, unspecified: Secondary | ICD-10-CM

## 2017-11-22 DIAGNOSIS — R578 Other shock: Secondary | ICD-10-CM | POA: Diagnosis not present

## 2017-11-22 DIAGNOSIS — Z880 Allergy status to penicillin: Secondary | ICD-10-CM

## 2017-11-22 DIAGNOSIS — N390 Urinary tract infection, site not specified: Secondary | ICD-10-CM | POA: Diagnosis present

## 2017-11-22 DIAGNOSIS — E871 Hypo-osmolality and hyponatremia: Secondary | ICD-10-CM | POA: Diagnosis not present

## 2017-11-22 DIAGNOSIS — I129 Hypertensive chronic kidney disease with stage 1 through stage 4 chronic kidney disease, or unspecified chronic kidney disease: Secondary | ICD-10-CM | POA: Diagnosis present

## 2017-11-22 DIAGNOSIS — N179 Acute kidney failure, unspecified: Secondary | ICD-10-CM | POA: Diagnosis not present

## 2017-11-22 DIAGNOSIS — A0472 Enterocolitis due to Clostridium difficile, not specified as recurrent: Secondary | ICD-10-CM | POA: Diagnosis not present

## 2017-11-22 DIAGNOSIS — N3001 Acute cystitis with hematuria: Secondary | ICD-10-CM

## 2017-11-22 DIAGNOSIS — E876 Hypokalemia: Secondary | ICD-10-CM | POA: Diagnosis present

## 2017-11-22 DIAGNOSIS — A408 Other streptococcal sepsis: Secondary | ICD-10-CM | POA: Diagnosis not present

## 2017-11-22 DIAGNOSIS — I4891 Unspecified atrial fibrillation: Secondary | ICD-10-CM | POA: Diagnosis present

## 2017-11-22 DIAGNOSIS — N183 Chronic kidney disease, stage 3 (moderate): Secondary | ICD-10-CM | POA: Diagnosis present

## 2017-11-22 DIAGNOSIS — I10 Essential (primary) hypertension: Secondary | ICD-10-CM | POA: Diagnosis present

## 2017-11-22 DIAGNOSIS — A419 Sepsis, unspecified organism: Secondary | ICD-10-CM | POA: Diagnosis present

## 2017-11-22 DIAGNOSIS — Z87891 Personal history of nicotine dependence: Secondary | ICD-10-CM

## 2017-11-22 DIAGNOSIS — R627 Adult failure to thrive: Secondary | ICD-10-CM

## 2017-11-22 DIAGNOSIS — Z515 Encounter for palliative care: Secondary | ICD-10-CM

## 2017-11-22 DIAGNOSIS — R404 Transient alteration of awareness: Secondary | ICD-10-CM | POA: Diagnosis not present

## 2017-11-22 DIAGNOSIS — X58XXXA Exposure to other specified factors, initial encounter: Secondary | ICD-10-CM | POA: Diagnosis not present

## 2017-11-22 DIAGNOSIS — S20212A Contusion of left front wall of thorax, initial encounter: Secondary | ICD-10-CM

## 2017-11-22 DIAGNOSIS — Z972 Presence of dental prosthetic device (complete) (partial): Secondary | ICD-10-CM

## 2017-11-22 DIAGNOSIS — I350 Nonrheumatic aortic (valve) stenosis: Secondary | ICD-10-CM | POA: Diagnosis present

## 2017-11-22 DIAGNOSIS — R531 Weakness: Secondary | ICD-10-CM | POA: Diagnosis not present

## 2017-11-22 DIAGNOSIS — R791 Abnormal coagulation profile: Secondary | ICD-10-CM | POA: Diagnosis present

## 2017-11-22 DIAGNOSIS — Z91048 Other nonmedicinal substance allergy status: Secondary | ICD-10-CM

## 2017-11-22 DIAGNOSIS — Z91018 Allergy to other foods: Secondary | ICD-10-CM

## 2017-11-22 DIAGNOSIS — K047 Periapical abscess without sinus: Secondary | ICD-10-CM

## 2017-11-22 DIAGNOSIS — I482 Chronic atrial fibrillation: Secondary | ICD-10-CM | POA: Diagnosis present

## 2017-11-22 DIAGNOSIS — R55 Syncope and collapse: Secondary | ICD-10-CM | POA: Diagnosis not present

## 2017-11-22 DIAGNOSIS — Z66 Do not resuscitate: Secondary | ICD-10-CM

## 2017-11-22 DIAGNOSIS — G301 Alzheimer's disease with late onset: Secondary | ICD-10-CM | POA: Diagnosis not present

## 2017-11-22 DIAGNOSIS — Z85828 Personal history of other malignant neoplasm of skin: Secondary | ICD-10-CM

## 2017-11-22 DIAGNOSIS — E861 Hypovolemia: Secondary | ICD-10-CM | POA: Diagnosis present

## 2017-11-22 LAB — COMPREHENSIVE METABOLIC PANEL
ALK PHOS: 53 U/L (ref 38–126)
ALT: 11 U/L — AB (ref 17–63)
ANION GAP: 12 (ref 5–15)
AST: 20 U/L (ref 15–41)
Albumin: 3.3 g/dL — ABNORMAL LOW (ref 3.5–5.0)
BILIRUBIN TOTAL: 0.9 mg/dL (ref 0.3–1.2)
BUN: 54 mg/dL — ABNORMAL HIGH (ref 6–20)
CALCIUM: 8.9 mg/dL (ref 8.9–10.3)
CO2: 18 mmol/L — AB (ref 22–32)
Chloride: 99 mmol/L — ABNORMAL LOW (ref 101–111)
Creatinine, Ser: 1.5 mg/dL — ABNORMAL HIGH (ref 0.61–1.24)
GFR, EST AFRICAN AMERICAN: 46 mL/min — AB (ref >60)
GFR, EST NON AFRICAN AMERICAN: 40 mL/min — AB (ref >60)
Glucose, Bld: 108 mg/dL — ABNORMAL HIGH (ref 65–99)
Potassium: 4.4 mmol/L (ref 3.5–5.1)
Sodium: 129 mmol/L — ABNORMAL LOW (ref 135–145)
TOTAL PROTEIN: 5.9 g/dL — AB (ref 6.5–8.1)

## 2017-11-22 LAB — URINALYSIS, ROUTINE W REFLEX MICROSCOPIC
BACTERIA UA: NONE SEEN
Bilirubin Urine: NEGATIVE
Glucose, UA: NEGATIVE mg/dL
Ketones, ur: NEGATIVE mg/dL
NITRITE: NEGATIVE
PROTEIN: NEGATIVE mg/dL
Specific Gravity, Urine: 1.011 (ref 1.005–1.030)
pH: 6 (ref 5.0–8.0)

## 2017-11-22 LAB — PROTIME-INR
INR: 1.58
PROTHROMBIN TIME: 18.7 s — AB (ref 11.4–15.2)

## 2017-11-22 LAB — INFLUENZA PANEL BY PCR (TYPE A & B)
INFLAPCR: NEGATIVE
Influenza B By PCR: NEGATIVE

## 2017-11-22 LAB — CBC WITH DIFFERENTIAL/PLATELET
BASOS PCT: 0 %
Basophils Absolute: 0 10*3/uL (ref 0.0–0.1)
EOS ABS: 0 10*3/uL (ref 0.0–0.7)
Eosinophils Relative: 0 %
HCT: 32.7 % — ABNORMAL LOW (ref 39.0–52.0)
Hemoglobin: 11.1 g/dL — ABNORMAL LOW (ref 13.0–17.0)
LYMPHS ABS: 0.6 10*3/uL — AB (ref 0.7–4.0)
Lymphocytes Relative: 4 %
MCH: 31.9 pg (ref 26.0–34.0)
MCHC: 33.9 g/dL (ref 30.0–36.0)
MCV: 94 fL (ref 78.0–100.0)
Monocytes Absolute: 0.2 10*3/uL (ref 0.1–1.0)
Monocytes Relative: 1 %
NEUTROS PCT: 95 %
Neutro Abs: 12.5 10*3/uL — ABNORMAL HIGH (ref 1.7–7.7)
PLATELETS: 136 10*3/uL — AB (ref 150–400)
RBC: 3.48 MIL/uL — AB (ref 4.22–5.81)
RDW: 14.1 % (ref 11.5–15.5)
WBC: 13.3 10*3/uL — AB (ref 4.0–10.5)

## 2017-11-22 LAB — I-STAT CG4 LACTIC ACID, ED: LACTIC ACID, VENOUS: 1.2 mmol/L (ref 0.5–1.9)

## 2017-11-22 MED ORDER — SODIUM CHLORIDE 0.9 % IV BOLUS (SEPSIS)
1000.0000 mL | Freq: Once | INTRAVENOUS | Status: AC
  Administered 2017-11-23: 1000 mL via INTRAVENOUS

## 2017-11-22 MED ORDER — LEVOFLOXACIN IN D5W 750 MG/150ML IV SOLN: 750.0000 mg | INTRAVENOUS | Status: DC

## 2017-11-22 MED ORDER — SODIUM CHLORIDE 0.9 % IV SOLN
1.0000 g | Freq: Three times a day (TID) | INTRAVENOUS | Status: DC
  Administered 2017-11-23 – 2017-11-24 (×4): 1 g via INTRAVENOUS
  Filled 2017-11-22 (×6): qty 1

## 2017-11-22 MED ORDER — SODIUM CHLORIDE 0.9 % IV SOLN
2.0000 g | Freq: Once | INTRAVENOUS | Status: AC
  Administered 2017-11-22: 2 g via INTRAVENOUS
  Filled 2017-11-22: qty 2

## 2017-11-22 MED ORDER — SODIUM CHLORIDE 0.9 % IV SOLN
1000.0000 mL | INTRAVENOUS | Status: DC
  Administered 2017-11-23 (×2): 1000 mL via INTRAVENOUS

## 2017-11-22 MED ORDER — ACETAMINOPHEN 325 MG PO TABS
650.0000 mg | ORAL_TABLET | Freq: Once | ORAL | Status: AC
  Administered 2017-11-22: 650 mg via ORAL
  Filled 2017-11-22: qty 2

## 2017-11-22 MED ORDER — HYDROCORTISONE NA SUCCINATE PF 100 MG IJ SOLR
100.0000 mg | Freq: Once | INTRAMUSCULAR | Status: AC
  Administered 2017-11-23: 100 mg via INTRAVENOUS
  Filled 2017-11-22: qty 2

## 2017-11-22 MED ORDER — SODIUM CHLORIDE 0.9 % IV BOLUS (SEPSIS)
1000.0000 mL | Freq: Once | INTRAVENOUS | Status: AC
  Administered 2017-11-22: 1000 mL via INTRAVENOUS

## 2017-11-22 MED ORDER — SODIUM CHLORIDE 0.9 % IV SOLN
1000.0000 mL | INTRAVENOUS | Status: DC
  Administered 2017-11-22: 1000 mL via INTRAVENOUS

## 2017-11-22 MED ORDER — VANCOMYCIN HCL IN DEXTROSE 750-5 MG/150ML-% IV SOLN: 750.0000 mg | INTRAVENOUS | Status: DC

## 2017-11-22 MED ORDER — LEVOFLOXACIN IN D5W 750 MG/150ML IV SOLN
750.0000 mg | Freq: Once | INTRAVENOUS | Status: AC
  Administered 2017-11-22: 750 mg via INTRAVENOUS
  Filled 2017-11-22: qty 150

## 2017-11-22 MED ORDER — VANCOMYCIN HCL IN DEXTROSE 1-5 GM/200ML-% IV SOLN
1000.0000 mg | Freq: Once | INTRAVENOUS | Status: AC
  Administered 2017-11-23: 1000 mg via INTRAVENOUS
  Filled 2017-11-22: qty 200

## 2017-11-22 NOTE — ED Notes (Signed)
RN notified of sepsis  

## 2017-11-22 NOTE — ED Provider Notes (Signed)
[facility] EMERGENCY DEPARTMENT Provider Note   CSN: [id number] Arrival date & time: 11/22/17  1935     History   Chief Complaint Chief Complaint  Patient presents with  . Weakness    HPI Peter Moreno is a 84 y.o. male.  HPI Peter Moreno is a 84 y.o. male of dementia, atrial fibrillation, hypertension, presents to emergency department with complaint of generalized weakness.  Patient was apparently trying to get to the bathroom, but was unable to stand up due to weakness.  EMS was called.  Upon arrival here patient was found to have fever 103.  Family states that patient has been running low blood pressures over the last several weeks and was admitted and as per for hypotension.  His medications were switched.  Patient has been eating or drinking as much.  There is some positive influenza test in the family.  Patient denies any complaints at this time.  He states he is just not feeling well  Past Medical History:  Diagnosis Date  . Atrial fibrillation ([place])   . Hypertension   . Skin cancer     Patient Active Problem List   Diagnosis Date Noted  . Alzheimer's dementia 09/29/2017  . Murmur 04/12/2015  . Encounter for therapeutic drug monitoring 11/14/2013  . Long term (current) use of anticoagulants 07/06/2013  . Atrial fibrillation ([place]) 06/22/2013  . Essential hypertension 06/22/2013    Past Surgical History:  Procedure Laterality Date  . Cataracts         Home Medications    Prior to Admission medications   Medication Sig Start Date End Date Taking? Authorizing Provider  allopurinol (ZYLOPRIM) 300 MG tablet Take 300 mg by mouth daily.   Yes [provider]  Cholecalciferol (VITAMIN D-3) 1000 units CAPS Take 1,000 Units by mouth daily.   Yes [provider]  diltiazem (CARDIZEM CD) 120 MG 24 hr capsule Take 1 capsule (120 mg total) by mouth daily. Schedule appointment for further refills 10/08/17  Yes Crenshaw, Denice Bors, MD    donepezil (ARICEPT) 10 MG tablet Take 1 tablet (10 mg total) by mouth at bedtime. 09/29/17  Yes Melvenia Beam, MD  erythromycin ophthalmic ointment Place 1 application into both eyes daily.   Yes [provider]  linaclotide (LINZESS) 290 MCG CAPS capsule Take 290 mcg by mouth daily before breakfast.   Yes [provider]  midodrine (PROAMATINE) 2.5 MG tablet Take 5 mg by mouth 3 (three) times daily. 11/01/17  Yes [provider]  Multiple Vitamins-Minerals (CENTRUM SILVER 50+MEN) TABS Take 1 tablet by mouth daily with breakfast.   Yes [provider]  Polyethylene Glycol 3350 (MIRALAX PO) Take 20 g by mouth daily.    Yes [provider]  warfarin (COUMADIN) 5 MG tablet TAKE ONE TO ONE AND ONE-HALF TABLETS DAILY AS DIRECTED BY COUMADIN CLINIC Patient taking differently: Take 7.5 mg by mouth in the evening on Sun/Tues/Thurs/Sat and 5 mg on Mon/Wed/Fri 06/10/17  Yes Crenshaw, Denice Bors, MD  benazepril (LOTENSIN) 5 MG tablet TAKE 1 TABLET DAILY Patient not taking: Reported on 11/22/2017 03/30/17   Lelon Perla, MD    Family History Family History  Problem Relation Age of Onset  . Dementia Mother   . Heart disease Unknown        No family history    Social History Social History   Tobacco Use  . Smoking status: Former Smoker    Packs/day: 3.00    Types: Cigarettes  Start date: 06/22/1949    Last attempt to quit: 06/22/1969    Years since quitting: 48.4  . Smokeless tobacco: Never Used  Substance Use Topics  . Alcohol use: Yes    Comment: Occasional  . Drug use: Not on file     Allergies   Adhesive [tape]; Sunflower oil; and Penicillins   Review of Systems Review of Systems  Constitutional: Positive for chills, fatigue and fever.  Respiratory: Negative for cough, chest tightness and shortness of breath.   Cardiovascular: Negative for chest pain, palpitations and leg swelling.  Gastrointestinal: Negative for abdominal  distention, abdominal pain, diarrhea, nausea and vomiting.  Genitourinary: Positive for difficulty urinating. Negative for dysuria, frequency, hematuria and urgency.  Musculoskeletal: Negative for arthralgias, myalgias, neck pain and neck stiffness.  Skin: Negative for rash.  Allergic/Immunologic: Negative for immunocompromised state.  Neurological: Positive for weakness. Negative for dizziness, light-headedness, numbness and headaches.  All other systems reviewed and are negative.    Physical Exam Updated Vital Signs BP (!) 81/61   Pulse 95   Temp (!) 103 F (39.4 C) (Rectal)   Resp 17   Ht 5\' 6"  (1.676 m)   Wt 62.1 kg (137 lb)   SpO2 98%   BMI 22.11 kg/m   Physical Exam  Constitutional: He is oriented to person, place, and time. He appears well-developed and well-nourished.  Ill-appearing  HENT:  Head: Normocephalic and atraumatic.  Mouth/Throat: Oropharynx is clear and moist.  Eyes: Conjunctivae and EOM are normal. Pupils are equal, round, and reactive to light.  Neck: Normal range of motion. Neck supple.  No meningismus  Cardiovascular: Normal heart sounds.  Tachycardic, irregular  Pulmonary/Chest: Effort normal. No respiratory distress. He has no wheezes. He has no rales.  Abdominal: Soft. Bowel sounds are normal. He exhibits no distension. There is no tenderness. There is no rebound.  Genitourinary:  Genitourinary Comments: Some pink blood around meatus  Musculoskeletal: He exhibits no edema.  Neurological: He is alert and oriented to person, place, and time.  Skin: Skin is warm and dry.  Nursing note and vitals reviewed.    ED Treatments / Results  Labs (all labs ordered are listed, but only abnormal results are displayed) Labs Reviewed  COMPREHENSIVE METABOLIC PANEL - Abnormal; Notable for the following components:      Result Value   Sodium 129 (*)    Chloride 99 (*)    CO2 18 (*)    Glucose, Bld 108 (*)    BUN 54 (*)    Creatinine, Ser 1.50 (*)     Total Protein 5.9 (*)    Albumin 3.3 (*)    ALT 11 (*)    GFR calc non Af Amer 40 (*)    GFR calc Af Amer 46 (*)    All other components within normal limits  CBC WITH DIFFERENTIAL/PLATELET - Abnormal; Notable for the following components:   WBC 13.3 (*)    RBC 3.48 (*)    Hemoglobin 11.1 (*)    HCT 32.7 (*)    Platelets 136 (*)    Neutro Abs 12.5 (*)    Lymphs Abs 0.6 (*)    All other components within normal limits  PROTIME-INR - Abnormal; Notable for the following components:   Prothrombin Time 18.7 (*)    All other components within normal limits  URINALYSIS, ROUTINE W REFLEX MICROSCOPIC - Abnormal; Notable for the following components:   APPearance HAZY (*)    Hgb urine dipstick LARGE (*)  Leukocytes, UA MODERATE (*)    Squamous Epithelial / LPF 0-5 (*)    All other components within normal limits  CULTURE, BLOOD (ROUTINE X 2)  CULTURE, BLOOD (ROUTINE X 2)  URINE CULTURE  INFLUENZA PANEL BY PCR (TYPE A & B)  I-STAT CG4 LACTIC ACID, ED    EKG  EKG Interpretation None       Radiology Dg Chest 2 View  Result Date: 11/22/2017 CLINICAL DATA:  Sepsis EXAM: CHEST  2 VIEW COMPARISON:  10/31/2017 FINDINGS: The heart is mildly enlarged but stable. There is moderate tortuosity and calcification of the thoracic aorta. Chronic bronchitic and emphysematous lung changes but no definite infiltrates, edema or effusions. The bony thorax is intact. IMPRESSION: Mild stable cardiac enlargement. Chronic emphysematous and bronchitic lung changes without acute overlying pulmonary process. Electronically Signed   By: Marijo Sanes M.D.   On: 11/22/2017 21:21    Procedures Procedures (including critical care time)  Medications Ordered in ED Medications  sodium chloride 0.9 % bolus 1,000 mL (0 mLs Intravenous Stopped 11/22/17 2147)    And  sodium chloride 0.9 % bolus 1,000 mL (1,000 mLs Intravenous New Bag/Given 11/22/17 2147)  aztreonam (AZACTAM) 2 g in sodium chloride 0.9 % 100 mL  IVPB (not administered)  vancomycin (VANCOCIN) IVPB 1000 mg/200 mL premix (not administered)  0.9 %  sodium chloride infusion (0 mLs Intravenous Stopped 11/22/17 2154)  levofloxacin (LEVAQUIN) IVPB 750 mg (not administered)  vancomycin (VANCOCIN) IVPB 750 mg/150 ml premix (not administered)  aztreonam (AZACTAM) 1 g in sodium chloride 0.9 % 100 mL IVPB (not administered)  levofloxacin (LEVAQUIN) IVPB 750 mg (750 mg Intravenous New Bag/Given 11/22/17 2019)  acetaminophen (TYLENOL) tablet 650 mg (650 mg Oral Given 11/22/17 2024)     Initial Impression / Assessment and Plan / ED Course  I have reviewed the triage vital signs and the nursing notes.  Pertinent labs & imaging results that were available during my care of the patient were reviewed by me and considered in my medical decision making (see chart for details).      Pt with tachcyardia, in afib, fever of 103, has no complaints.  Meet Sirs criteria, will initiate code sepsis.  IV fluids, antibiotics, labs and cultures ordered  Sepsis - Repeat Assessment  Performed at:    9:30   Vitals     Blood pressure 103/71, pulse 95, temperature (!) 103 F (39.4 C), temperature source Rectal, resp. rate 17, height 5\' 6"  (1.676 m), weight 62.1 kg (137 lb), SpO2 98 %.  Heart:     Irregular rate and rhythm  Lungs:    CTA  Capillary Refill:   <2 sec  Peripheral Pulse:   Radial pulse palpable  Skin:     Normal Color   Vitals:   11/22/17 2036 11/22/17 2045 11/22/17 2100 11/22/17 2138  BP: (!) 116/56 113/85 103/71 (!) 81/61  Pulse: 96 (!) 105 95   Resp: 19 18 16 17   Temp:      TempSrc:      SpO2: 100% 100% 100% 98%  Weight:      Height:        9:57 PM Since being in ER, patient has constant urgency to urinate but only a few drops come out.  On my exam, some pink blood around meatus.  Bladder scanner shows greater than 600cc of urine in bladder. Will place a foley.   12:02 AM Blood pressures are in the 00Q systolic.  According to the  family,  patient has been dealing with this over the last several weeks.  Patient's son has been measuring his blood pressure at home and states that it runs in the 80s frequently.  This could however be due to sepsis.  Patient is receiving third liter of IV fluids at this time.  He is mentating well.  Urine shows too numerous to count white blood cells, moderate leukocytes, could be source of his infection.  Influenza is negative.  Will continue IV fluids and antibiotics.  Spoke with hospitalist, they will admit.   Vitals:   11/22/17 2230 11/22/17 2315 11/22/17 2330 11/22/17 2345  BP: (!) 83/65 (!) 85/59 92/64 (!) 86/59  Pulse:  95 96 88  Resp: 15 14 12 13   Temp: 99.5 F (37.5 C) 99.3 F (37.4 C) 99.1 F (37.3 C) 99 F (37.2 C)  TempSrc:      SpO2: 99% 100% 100% 99%  Weight:      Height:        Final Clinical Impressions(s) / ED Diagnoses   Final diagnoses:  Sepsis, due to unspecified organism ([place])  Hypotension, unspecified hypotension type  AKI (acute kidney injury) [facility])  Urinary retention    ED Discharge Orders    None       Jeannett Senior, PA-C 11/23/17 0003    Julianne Rice, MD 11/25/17 1028

## 2017-11-22 NOTE — ED Notes (Signed)
Patient transported to X-ray 

## 2017-11-22 NOTE — ED Notes (Signed)
Tatyanna, PA informed of pt BP and temperature. Will continue to monitor.

## 2017-11-22 NOTE — ED Triage Notes (Signed)
Per [place], pt arriving from home where family was helping walk patient back from the bathroom when he experienced right arm weakness around 1815. EMS arrived at [place] and right arm weakness had resolved. Family reports weakness and decreased appetite x1 week. Pt has hx of near syncope and a fib. Pt a fib on the monitor. Pt not oriented to time or place.

## 2017-11-22 NOTE — ED Notes (Signed)
ED Provider at bedside. 

## 2017-11-22 NOTE — ED Notes (Signed)
Tatyanna, PA aware of some bloody drainage at urethral site. Bloody drainage present prior to catheter insertion.

## 2017-11-22 NOTE — Progress Notes (Signed)
Pharmacy Antibiotic Note  Peter Moreno is a 84 y.o. male admitted on 11/22/2017 with sepsis.  Pharmacy has been consulted for vancomycin, aztreonam, and Levaquin dosing.  Given one time doses in the ED.  SCr 1.5,m CrCl ~82ml/min  Plan: Start vancomycin 750mg  IV Q24h Start Levaquin 750mg  IV Q48h Start aztreonam 500mg  IV Q8h Monitor clinical picture, renal function, VT prn F/U C&S, abx deescalation / LOT   Height: 5\' 6"  (167.6 cm) Weight: 137 lb (62.1 kg) IBW/kg (Calculated) : 63.8  Temp (24hrs), Avg:103 F (39.4 C), Min:103 F (39.4 C), Max:103 F (39.4 C)  No results for input(s): WBC, CREATININE, LATICACIDVEN, VANCOTROUGH, VANCOPEAK, VANCORANDOM, GENTTROUGH, GENTPEAK, GENTRANDOM, TOBRATROUGH, TOBRAPEAK, TOBRARND, AMIKACINPEAK, AMIKACINTROU, AMIKACIN in the last 168 hours.  CrCl cannot be calculated (Patient's most recent lab result is older than the maximum 21 days allowed.).    Allergies  Allergen Reactions  . Penicillins Rash    Thank you for allowing pharmacy to be a part of this patient's care.  Reginia Naas 11/22/2017 8:09 PM

## 2017-11-22 NOTE — ED Notes (Signed)
Bladder scanner: > 657 mL

## 2017-11-22 NOTE — H&P (Signed)
History and Physical    Peter Moreno OEV:[id number] DOB: 1927-07-01 DOA: 11/22/2017  Referring MD/NP/PA:   PCP: Cyndy Freeze, MD   Patient coming from:  The patient is coming from home.  At baseline, pt is partially dependent for most of ADL  Chief Complaint: Difficulty urinating and urge for urination, generalized weakness.  HPI: Peter Moreno is a 84 y.o. male with medical history significant of hypertension, atrial fibrillation on Coumadin, skin cancer, dementia, gout, who presents with difficulty urinating and urge for urination.  Per his son, patient has been having generalized weakness in the past 6 weeks. His son states that the patient has generalized weakness, but no unilateral weakness or numbness in extremities. Patient seems to have mild right facial droop, but his son states that is normal to patient due to missing teeth. In the past several days, patient has been having difficult urinating. He has urge for urination all the time. He does not seem to have dysuria per his son. Patient does not have chest pain, SOB or cough. His son states that his blood pressure has been running low in the past 6 weeks. He was seen in the ED of [facility] and was started with Midodrine, without clear diagnosis. Patient does not have nausea, vomiting or diarrhea. Pt had one or 2 episodes of confusion recently, but not today. Pt was found to have urinary retention and hematuria in ED. Foley cath was placed.  ED Course: pt was found to have positive urinalysis with small amount of leukocyte, lactic acid 1.20, INR 1.58, WBC 13.3, negative flu PCR, sodium 129, acute renal injury with creatinine 1.50 and BUN 54, temperature 103, tachycardia, oxygen saturation 97% on room air, blood pressure 81/61, which improved to 90/67 after 2 L normal saline bolus, negative chest x-ray for acute abnormalities. Patient is admitted to stepdown as inpatient.  Review of Systems:   General: has fevers, no body weight gain, has  fatigue HEENT: no blurry vision, hearing changes or sore throat Respiratory: no dyspnea, coughing, wheezing CV: no chest pain, no palpitations GI: no nausea, vomiting, abdominal pain, diarrhea, constipation GU: no dysuria, burning on urination. Has hematuria and difficulty urinating  Ext: no leg edema Neuro: no unilateral weakness, numbness, or tingling, no vision change or hearing loss Skin: no rash, no skin tear. MSK: No muscle spasm, no deformity, no limitation of range of movement in spin Heme: No easy bruising.  Travel history: No recent long distant travel.  Allergy:  Allergies  Allergen Reactions  . Adhesive [Tape] Other (See Comments)    THE PATIENT'S SKIN IS VERY THIN AND WILL TEAR AND BRUISE VERY EASILY!!  . Sunflower Oil Itching  . Penicillins Rash    Has patient had a PCN reaction causing immediate rash, facial/tongue/throat swelling, SOB or lightheadedness with hypotension: Yes Has patient had a PCN reaction causing severe rash involving mucus membranes or skin necrosis: Unk Has patient had a PCN reaction that required hospitalization: No Has patient had a PCN reaction occurring within the last 10 years: No If all of the above answers are "NO", then may proceed with Cephalosporin use.     Past Medical History:  Diagnosis Date  . Atrial fibrillation ([place])   . Hypertension   . Skin cancer     Past Surgical History:  Procedure Laterality Date  . Cataracts      Social History:  reports that he quit smoking about 48 years ago. His smoking use included cigarettes. He started smoking about 68 years  ago. He smoked 3.00 packs per day. he has never used smokeless tobacco. He reports that he drinks alcohol. His drug history is not on file.  Family History:  Family History  Problem Relation Age of Onset  . Dementia Mother   . Heart disease Unknown        No family history     Prior to Admission medications   Medication Sig Start Date End Date Taking? Authorizing  Provider  allopurinol (ZYLOPRIM) 300 MG tablet Take 300 mg by mouth daily.   Yes [provider]  Cholecalciferol (VITAMIN D-3) 1000 units CAPS Take 1,000 Units by mouth daily.   Yes [provider]  diltiazem (CARDIZEM CD) 120 MG 24 hr capsule Take 1 capsule (120 mg total) by mouth daily. Schedule appointment for further refills 10/08/17  Yes Crenshaw, Denice Bors, MD  donepezil (ARICEPT) 10 MG tablet Take 1 tablet (10 mg total) by mouth at bedtime. 09/29/17  Yes Melvenia Beam, MD  erythromycin ophthalmic ointment Place 1 application into both eyes daily.   Yes [provider]  linaclotide (LINZESS) 290 MCG CAPS capsule Take 290 mcg by mouth daily before breakfast.   Yes [provider]  midodrine (PROAMATINE) 2.5 MG tablet Take 5 mg by mouth 3 (three) times daily. 11/01/17  Yes [provider]  Multiple Vitamins-Minerals (CENTRUM SILVER 50+MEN) TABS Take 1 tablet by mouth daily with breakfast.   Yes [provider]  Polyethylene Glycol 3350 (MIRALAX PO) Take 20 g by mouth daily.    Yes [provider]  warfarin (COUMADIN) 5 MG tablet TAKE ONE TO ONE AND ONE-HALF TABLETS DAILY AS DIRECTED BY COUMADIN CLINIC Patient taking differently: Take 7.5 mg by mouth in the evening on Sun/Tues/Thurs/Sat and 5 mg on Mon/Wed/Fri 06/10/17  Yes Crenshaw, Denice Bors, MD  benazepril (LOTENSIN) 5 MG tablet TAKE 1 TABLET DAILY Patient not taking: Reported on 11/22/2017 03/30/17   Lelon Perla, MD    Physical Exam: Vitals:   11/23/17 0300 11/23/17 0400 11/23/17 0415 11/23/17 0430  BP: 95/63 96/70 95/61  (!) 96/58  Pulse: 93  88 94  Resp: (!) 25 (!) 25 (!) 24 (!) 24  Temp:      TempSrc:      SpO2: 100% 99% 99% 100%  Weight:      Height:       General: Not in acute distress HEENT:       Eyes: PERRL, EOMI, no scleral icterus.       ENT: No discharge from the ears and nose, no pharynx injection, no tonsillar enlargement.        Neck: No JVD, no bruit,  no mass felt. Heme: No neck lymph node enlargement. Cardiac: S1/S2, RRR, 2/6 murmurs, No gallops or rubs. Respiratory: No rales, wheezing, rhonchi or rubs. GI: Soft, nondistended, nontender, no rebound pain, no organomegaly, BS present. GU: No hematuria Ext: No pitting leg edema bilaterally. 2+DP/PT pulse bilaterally. Musculoskeletal: No joint deformities, No joint redness or warmth, no limitation of ROM in spin. Skin: No rashes.  Neuro: Alert, oriented X3, cranial nerves II-XII grossly intact, moves all extremities normally.  Psych: Patient is not psychotic, no suicidal or hemocidal ideation.  Labs on Admission: I have personally reviewed following labs and imaging studies  CBC: Recent Labs  Lab 11/22/17 2037 11/23/17 0306  WBC 13.3* 10.5  NEUTROABS 12.5*  --   HGB 11.1* 9.3*  HCT 32.7* 27.5*  MCV 94.0 94.8  PLT 136* 90*   Basic Metabolic  Panel: Recent Labs  Lab 11/22/17 2037 11/23/17 0306  NA 129* 134*  K 4.4 3.9  CL 99* 109  CO2 18* 17*  GLUCOSE 108* 135*  BUN 54* 42*  CREATININE 1.50* 1.27*  CALCIUM 8.9 7.9*   GFR: Estimated Creatinine Clearance: 35.3 mL/min (A) (by C-G formula based on SCr of 1.27 mg/dL (H)). Liver Function Tests: Recent Labs  Lab 11/22/17 2037  AST 20  ALT 11*  ALKPHOS 53  BILITOT 0.9  PROT 5.9*  ALBUMIN 3.3*   No results for input(s): LIPASE, AMYLASE in the last 168 hours. No results for input(s): AMMONIA in the last 168 hours. Coagulation Profile: Recent Labs  Lab 11/22/17 2037  INR 1.58   Cardiac Enzymes: No results for input(s): CKTOTAL, CKMB, CKMBINDEX, TROPONINI in the last 168 hours. BNP (last 3 results) No results for input(s): PROBNP in the last 8760 hours. HbA1C: No results for input(s): HGBA1C in the last 72 hours. CBG: No results for input(s): GLUCAP in the last 168 hours. Lipid Profile: No results for input(s): CHOL, HDL, LDLCALC, TRIG, CHOLHDL, LDLDIRECT in the last 72 hours. Thyroid Function Tests: Recent  Labs    11/23/17 0306  TSH 1.156  FREET4 1.08   Anemia Panel: No results for input(s): VITAMINB12, FOLATE, FERRITIN, TIBC, IRON, RETICCTPCT in the last 72 hours. Urine analysis:    Component Value Date/Time   COLORURINE YELLOW 11/22/2017 2227   APPEARANCEUR HAZY (A) 11/22/2017 2227   LABSPEC 1.011 11/22/2017 2227   PHURINE 6.0 11/22/2017 2227   GLUCOSEU NEGATIVE 11/22/2017 2227   HGBUR LARGE (A) 11/22/2017 2227   BILIRUBINUR NEGATIVE 11/22/2017 2227   KETONESUR NEGATIVE 11/22/2017 2227   PROTEINUR NEGATIVE 11/22/2017 2227   NITRITE NEGATIVE 11/22/2017 2227   LEUKOCYTESUR MODERATE (A) 11/22/2017 2227   Sepsis Labs: @LABRCNTIP (procalcitonin:4,lacticidven:4) )No results found for this or any previous visit (from the past 240 hour(s)).   Radiological Exams on Admission: Dg Chest 2 View  Result Date: 11/22/2017 CLINICAL DATA:  Sepsis EXAM: CHEST  2 VIEW COMPARISON:  10/31/2017 FINDINGS: The heart is mildly enlarged but stable. There is moderate tortuosity and calcification of the thoracic aorta. Chronic bronchitic and emphysematous lung changes but no definite infiltrates, edema or effusions. The bony thorax is intact. IMPRESSION: Mild stable cardiac enlargement. Chronic emphysematous and bronchitic lung changes without acute overlying pulmonary process. Electronically Signed   By: Marijo Sanes M.D.   On: 11/22/2017 21:21     EKG: Independently reviewed.  Atrial fibrillation with RVR, QTC 454   Assessment/Plan Principal Problem:   UTI (urinary tract infection) Active Problems:   Atrial fibrillation with RVR (H[facility])   Essential hypertension   Long term (current) use of anticoagulants   Alzheimer's dementia   Sepsis ([place])   Hypotension   Hyponatremia   Sepsis due to UTI (urinary tract infection): Patient meets criteria for sepsis with leukocytosis, hypotension and fever. Lactic acid is normal. Blood pressure responded to IV fluid. Currently blood pressure 90/67 after 2 L  normal saline bolus.   - Admit to SDU as inpt  - IV aztreonam (patient also received one dose of vancomycin and Levaquin in ED) - Follow up results of urine and blood cx and amend antibiotic regimen if needed per sensitivity results - prn Zofran for nausea - will get Procalcitonin and trend lactic acid levels per sepsis protocol.  - IVF: 3L of NS bolus in ED, followed by 150 cc/h   Hypotension: pt has been having hypotension for 6 weeks per his son.  He was seen in the ED of [place] and was started with Midodrine, without clear diagnosis. Etiology is not clear. Potential differential diagnoses include sepsis and adrenal insufficiency. -treat sepsis as above -give one dose of Solu Cortef 100 mg 1 -Check cortisol level -Aggressive IV fluid as above  Atrial fibrillation with RVR ([place]): CHA2DS2-VASc Score is 3, needs oral anticoagulation. Patient is on Coumadin at home. INR is  subtherapeutic 1.58 on admission. His HR is up to 119-->90s currently. -willl hold coumadin due to hematuria -Hold Cardizem due to hypotension -tele monitoring  HTN: no has hypotension -hold lotensin  Alzheimer's dementia: no behavior change. -continue donepezil  Hyponatremia: Na 129. Etiology is not clear. Currently his mental status is at baseline. -IV NS: as above - Will check urine sodium, urine osmolality, serum osmolality. - check TSH - f/u by BMP  DVT ppx: SCD Code Status: DNR (I discussed with patient in the presence of his son who is the POA, and explained the meaning of CODE STATUS. Patient wants to be DNR) Family Communication:   Yes, patient's son and daughter-in-law at bed side Disposition Plan:  Anticipate discharge back to previous home environment Consults called:  none Admission status: Obs / tele  Inpatient/tele   medical floor/obs     SDU/inpation       Date of Service 11/23/2017    Ivor Costa [facility] Pager [phone]  If 7PM-7AM, please contact  night-coverage [email or web address] Password [facility] 11/23/2017, 4:54 AM

## 2017-11-23 ENCOUNTER — Telehealth: Payer: Self-pay | Admitting: Cardiology

## 2017-11-23 ENCOUNTER — Other Ambulatory Visit: Payer: Self-pay

## 2017-11-23 DIAGNOSIS — D62 Acute posthemorrhagic anemia: Secondary | ICD-10-CM | POA: Diagnosis not present

## 2017-11-23 DIAGNOSIS — Z7901 Long term (current) use of anticoagulants: Secondary | ICD-10-CM | POA: Diagnosis not present

## 2017-11-23 DIAGNOSIS — K047 Periapical abscess without sinus: Secondary | ICD-10-CM | POA: Diagnosis not present

## 2017-11-23 DIAGNOSIS — N179 Acute kidney failure, unspecified: Secondary | ICD-10-CM | POA: Diagnosis not present

## 2017-11-23 DIAGNOSIS — I959 Hypotension, unspecified: Secondary | ICD-10-CM | POA: Diagnosis not present

## 2017-11-23 DIAGNOSIS — R58 Hemorrhage, not elsewhere classified: Secondary | ICD-10-CM | POA: Diagnosis not present

## 2017-11-23 DIAGNOSIS — J90 Pleural effusion, not elsewhere classified: Secondary | ICD-10-CM | POA: Diagnosis not present

## 2017-11-23 DIAGNOSIS — A0472 Enterocolitis due to Clostridium difficile, not specified as recurrent: Secondary | ICD-10-CM | POA: Diagnosis not present

## 2017-11-23 DIAGNOSIS — R39198 Other difficulties with micturition: Secondary | ICD-10-CM | POA: Diagnosis not present

## 2017-11-23 DIAGNOSIS — F0390 Unspecified dementia without behavioral disturbance: Secondary | ICD-10-CM | POA: Diagnosis not present

## 2017-11-23 DIAGNOSIS — Z452 Encounter for adjustment and management of vascular access device: Secondary | ICD-10-CM | POA: Diagnosis not present

## 2017-11-23 DIAGNOSIS — A408 Other streptococcal sepsis: Secondary | ICD-10-CM | POA: Diagnosis present

## 2017-11-23 DIAGNOSIS — Z66 Do not resuscitate: Secondary | ICD-10-CM | POA: Diagnosis not present

## 2017-11-23 DIAGNOSIS — I340 Nonrheumatic mitral (valve) insufficiency: Secondary | ICD-10-CM | POA: Diagnosis not present

## 2017-11-23 DIAGNOSIS — R339 Retention of urine, unspecified: Secondary | ICD-10-CM | POA: Diagnosis not present

## 2017-11-23 DIAGNOSIS — R4020 Unspecified coma: Secondary | ICD-10-CM | POA: Diagnosis not present

## 2017-11-23 DIAGNOSIS — B955 Unspecified streptococcus as the cause of diseases classified elsewhere: Secondary | ICD-10-CM | POA: Diagnosis not present

## 2017-11-23 DIAGNOSIS — Z91018 Allergy to other foods: Secondary | ICD-10-CM | POA: Diagnosis not present

## 2017-11-23 DIAGNOSIS — G309 Alzheimer's disease, unspecified: Secondary | ICD-10-CM | POA: Diagnosis not present

## 2017-11-23 DIAGNOSIS — Z85828 Personal history of other malignant neoplasm of skin: Secondary | ICD-10-CM | POA: Diagnosis not present

## 2017-11-23 DIAGNOSIS — R7881 Bacteremia: Secondary | ICD-10-CM | POA: Diagnosis not present

## 2017-11-23 DIAGNOSIS — R6520 Severe sepsis without septic shock: Secondary | ICD-10-CM | POA: Diagnosis not present

## 2017-11-23 DIAGNOSIS — B954 Other streptococcus as the cause of diseases classified elsewhere: Secondary | ICD-10-CM | POA: Diagnosis not present

## 2017-11-23 DIAGNOSIS — I4891 Unspecified atrial fibrillation: Secondary | ICD-10-CM | POA: Diagnosis not present

## 2017-11-23 DIAGNOSIS — R011 Cardiac murmur, unspecified: Secondary | ICD-10-CM | POA: Diagnosis not present

## 2017-11-23 DIAGNOSIS — K045 Chronic apical periodontitis: Secondary | ICD-10-CM | POA: Diagnosis present

## 2017-11-23 DIAGNOSIS — Z8719 Personal history of other diseases of the digestive system: Secondary | ICD-10-CM | POA: Diagnosis not present

## 2017-11-23 DIAGNOSIS — R578 Other shock: Secondary | ICD-10-CM | POA: Diagnosis not present

## 2017-11-23 DIAGNOSIS — R4182 Altered mental status, unspecified: Secondary | ICD-10-CM | POA: Diagnosis not present

## 2017-11-23 DIAGNOSIS — G301 Alzheimer's disease with late onset: Secondary | ICD-10-CM | POA: Diagnosis not present

## 2017-11-23 DIAGNOSIS — K0889 Other specified disorders of teeth and supporting structures: Secondary | ICD-10-CM | POA: Diagnosis not present

## 2017-11-23 DIAGNOSIS — N3001 Acute cystitis with hematuria: Secondary | ICD-10-CM | POA: Diagnosis not present

## 2017-11-23 DIAGNOSIS — I361 Nonrheumatic tricuspid (valve) insufficiency: Secondary | ICD-10-CM | POA: Diagnosis not present

## 2017-11-23 DIAGNOSIS — R3915 Urgency of urination: Secondary | ICD-10-CM | POA: Diagnosis not present

## 2017-11-23 DIAGNOSIS — N401 Enlarged prostate with lower urinary tract symptoms: Secondary | ICD-10-CM | POA: Diagnosis not present

## 2017-11-23 DIAGNOSIS — I350 Nonrheumatic aortic (valve) stenosis: Secondary | ICD-10-CM | POA: Diagnosis present

## 2017-11-23 DIAGNOSIS — I10 Essential (primary) hypertension: Secondary | ICD-10-CM | POA: Diagnosis not present

## 2017-11-23 DIAGNOSIS — G9341 Metabolic encephalopathy: Secondary | ICD-10-CM | POA: Diagnosis not present

## 2017-11-23 DIAGNOSIS — Z8679 Personal history of other diseases of the circulatory system: Secondary | ICD-10-CM | POA: Diagnosis not present

## 2017-11-23 DIAGNOSIS — J189 Pneumonia, unspecified organism: Secondary | ICD-10-CM | POA: Diagnosis present

## 2017-11-23 DIAGNOSIS — Z515 Encounter for palliative care: Secondary | ICD-10-CM | POA: Diagnosis not present

## 2017-11-23 DIAGNOSIS — N390 Urinary tract infection, site not specified: Secondary | ICD-10-CM | POA: Diagnosis present

## 2017-11-23 DIAGNOSIS — R319 Hematuria, unspecified: Secondary | ICD-10-CM | POA: Diagnosis present

## 2017-11-23 DIAGNOSIS — R627 Adult failure to thrive: Secondary | ICD-10-CM | POA: Diagnosis not present

## 2017-11-23 DIAGNOSIS — Z91048 Other nonmedicinal substance allergy status: Secondary | ICD-10-CM | POA: Diagnosis not present

## 2017-11-23 DIAGNOSIS — I272 Pulmonary hypertension, unspecified: Secondary | ICD-10-CM | POA: Diagnosis present

## 2017-11-23 DIAGNOSIS — I482 Chronic atrial fibrillation: Secondary | ICD-10-CM | POA: Diagnosis present

## 2017-11-23 DIAGNOSIS — D72829 Elevated white blood cell count, unspecified: Secondary | ICD-10-CM | POA: Diagnosis not present

## 2017-11-23 DIAGNOSIS — S20212A Contusion of left front wall of thorax, initial encounter: Secondary | ICD-10-CM | POA: Diagnosis not present

## 2017-11-23 DIAGNOSIS — R410 Disorientation, unspecified: Secondary | ICD-10-CM | POA: Diagnosis not present

## 2017-11-23 DIAGNOSIS — X58XXXA Exposure to other specified factors, initial encounter: Secondary | ICD-10-CM | POA: Diagnosis not present

## 2017-11-23 DIAGNOSIS — N183 Chronic kidney disease, stage 3 (moderate): Secondary | ICD-10-CM | POA: Diagnosis present

## 2017-11-23 DIAGNOSIS — K029 Dental caries, unspecified: Secondary | ICD-10-CM | POA: Diagnosis present

## 2017-11-23 DIAGNOSIS — S20219A Contusion of unspecified front wall of thorax, initial encounter: Secondary | ICD-10-CM | POA: Diagnosis not present

## 2017-11-23 DIAGNOSIS — A419 Sepsis, unspecified organism: Secondary | ICD-10-CM | POA: Diagnosis not present

## 2017-11-23 DIAGNOSIS — I9589 Other hypotension: Secondary | ICD-10-CM | POA: Diagnosis not present

## 2017-11-23 DIAGNOSIS — Z880 Allergy status to penicillin: Secondary | ICD-10-CM | POA: Diagnosis not present

## 2017-11-23 DIAGNOSIS — F0280 Dementia in other diseases classified elsewhere without behavioral disturbance: Secondary | ICD-10-CM | POA: Diagnosis not present

## 2017-11-23 DIAGNOSIS — E861 Hypovolemia: Secondary | ICD-10-CM | POA: Diagnosis present

## 2017-11-23 DIAGNOSIS — I129 Hypertensive chronic kidney disease with stage 1 through stage 4 chronic kidney disease, or unspecified chronic kidney disease: Secondary | ICD-10-CM | POA: Diagnosis present

## 2017-11-23 DIAGNOSIS — E871 Hypo-osmolality and hyponatremia: Secondary | ICD-10-CM | POA: Diagnosis not present

## 2017-11-23 LAB — BLOOD CULTURE ID PANEL (REFLEXED)
Acinetobacter baumannii: NOT DETECTED
CANDIDA GLABRATA: NOT DETECTED
CANDIDA KRUSEI: NOT DETECTED
CANDIDA TROPICALIS: NOT DETECTED
Candida albicans: NOT DETECTED
Candida parapsilosis: NOT DETECTED
ENTEROBACTER CLOACAE COMPLEX: NOT DETECTED
ESCHERICHIA COLI: NOT DETECTED
Enterobacteriaceae species: NOT DETECTED
Enterococcus species: NOT DETECTED
Haemophilus influenzae: NOT DETECTED
KLEBSIELLA PNEUMONIAE: NOT DETECTED
Klebsiella oxytoca: NOT DETECTED
Listeria monocytogenes: NOT DETECTED
NEISSERIA MENINGITIDIS: NOT DETECTED
PROTEUS SPECIES: NOT DETECTED
PSEUDOMONAS AERUGINOSA: NOT DETECTED
SERRATIA MARCESCENS: NOT DETECTED
STAPHYLOCOCCUS AUREUS BCID: NOT DETECTED
STAPHYLOCOCCUS SPECIES: NOT DETECTED
STREPTOCOCCUS PNEUMONIAE: NOT DETECTED
Streptococcus agalactiae: NOT DETECTED
Streptococcus pyogenes: NOT DETECTED
Streptococcus species: DETECTED — AB

## 2017-11-23 LAB — BASIC METABOLIC PANEL
Anion gap: 8 (ref 5–15)
BUN: 42 mg/dL — ABNORMAL HIGH (ref 6–20)
CHLORIDE: 109 mmol/L (ref 101–111)
CO2: 17 mmol/L — AB (ref 22–32)
Calcium: 7.9 mg/dL — ABNORMAL LOW (ref 8.9–10.3)
Creatinine, Ser: 1.27 mg/dL — ABNORMAL HIGH (ref 0.61–1.24)
GFR calc Af Amer: 56 mL/min — ABNORMAL LOW (ref >60)
GFR calc non Af Amer: 49 mL/min — ABNORMAL LOW (ref >60)
GLUCOSE: 135 mg/dL — AB (ref 65–99)
POTASSIUM: 3.9 mmol/L (ref 3.5–5.1)
Sodium: 134 mmol/L — ABNORMAL LOW (ref 135–145)

## 2017-11-23 LAB — CBC
HEMATOCRIT: 27.5 % — AB (ref 39.0–52.0)
Hemoglobin: 9.3 g/dL — ABNORMAL LOW (ref 13.0–17.0)
MCH: 32.1 pg (ref 26.0–34.0)
MCHC: 33.8 g/dL (ref 30.0–36.0)
MCV: 94.8 fL (ref 78.0–100.0)
Platelets: 90 10*3/uL — ABNORMAL LOW (ref 150–400)
RBC: 2.9 MIL/uL — ABNORMAL LOW (ref 4.22–5.81)
RDW: 14.2 % (ref 11.5–15.5)
WBC: 10.5 10*3/uL (ref 4.0–10.5)

## 2017-11-23 LAB — CORTISOL-AM, BLOOD

## 2017-11-23 LAB — TSH: TSH: 1.156 u[IU]/mL (ref 0.350–4.500)

## 2017-11-23 LAB — OSMOLALITY, URINE: Osmolality, Ur: 267 mOsm/kg — ABNORMAL LOW (ref 300–900)

## 2017-11-23 LAB — LACTIC ACID, PLASMA
LACTIC ACID, VENOUS: 1 mmol/L (ref 0.5–1.9)
Lactic Acid, Venous: 0.9 mmol/L (ref 0.5–1.9)

## 2017-11-23 LAB — T4, FREE: Free T4: 1.08 ng/dL (ref 0.61–1.12)

## 2017-11-23 LAB — CREATININE, URINE, RANDOM: Creatinine, Urine: 38.05 mg/dL

## 2017-11-23 LAB — SODIUM, URINE, RANDOM: Sodium, Ur: 25 mmol/L

## 2017-11-23 LAB — PROCALCITONIN: Procalcitonin: 0.77 ng/mL

## 2017-11-23 LAB — OSMOLALITY: OSMOLALITY: 288 mosm/kg (ref 275–295)

## 2017-11-23 MED ORDER — ZOLPIDEM TARTRATE 5 MG PO TABS
5.0000 mg | ORAL_TABLET | Freq: Every evening | ORAL | Status: DC | PRN
  Administered 2017-11-29: 5 mg via ORAL
  Filled 2017-11-23: qty 1

## 2017-11-23 MED ORDER — ONDANSETRON HCL 4 MG PO TABS: 4.0000 mg | ORAL_TABLET | Freq: Four times a day (QID) | ORAL | Status: DC | PRN

## 2017-11-23 MED ORDER — WARFARIN SODIUM 7.5 MG PO TABS
7.5000 mg | ORAL_TABLET | Freq: Once | ORAL | Status: AC
Start: 2017-11-23 — End: 2017-11-23
  Administered 2017-11-23: 7.5 mg via ORAL
  Filled 2017-11-23: qty 1

## 2017-11-23 MED ORDER — ACETAMINOPHEN 650 MG RE SUPP: 650.0000 mg | Freq: Four times a day (QID) | RECTAL | Status: DC | PRN

## 2017-11-23 MED ORDER — SODIUM CHLORIDE 0.9 % IV SOLN
1000.0000 mL | INTRAVENOUS | Status: DC
  Administered 2017-11-23 – 2017-12-06 (×12): 1000 mL via INTRAVENOUS

## 2017-11-23 MED ORDER — DONEPEZIL HCL 10 MG PO TABS
10.0000 mg | ORAL_TABLET | Freq: Every day | ORAL | Status: DC
  Administered 2017-11-23 – 2017-12-07 (×16): 10 mg via ORAL
  Filled 2017-11-23: qty 1
  Filled 2017-11-23 (×2): qty 2
  Filled 2017-11-23: qty 1
  Filled 2017-11-23 (×2): qty 2
  Filled 2017-11-23: qty 1
  Filled 2017-11-23 (×2): qty 2
  Filled 2017-11-23 (×2): qty 1
  Filled 2017-11-23: qty 2
  Filled 2017-11-23 (×3): qty 1
  Filled 2017-11-23: qty 2
  Filled 2017-11-23: qty 1

## 2017-11-23 MED ORDER — ONDANSETRON HCL 4 MG/2ML IJ SOLN: 4.0000 mg | Freq: Four times a day (QID) | INTRAMUSCULAR | Status: DC | PRN

## 2017-11-23 MED ORDER — ALLOPURINOL 300 MG PO TABS
300.0000 mg | ORAL_TABLET | Freq: Every day | ORAL | Status: DC
  Administered 2017-11-23 – 2017-12-07 (×15): 300 mg via ORAL
  Filled 2017-11-23 (×16): qty 1

## 2017-11-23 MED ORDER — VITAMIN D 1000 UNITS PO TABS
1000.0000 [IU] | ORAL_TABLET | Freq: Every day | ORAL | Status: DC
  Administered 2017-11-23 – 2017-12-07 (×15): 1000 [IU] via ORAL
  Filled 2017-11-23 (×16): qty 1

## 2017-11-23 MED ORDER — POLYETHYLENE GLYCOL 3350 17 G PO PACK: 17.0000 g | PACK | Freq: Every day | ORAL | Status: DC | PRN

## 2017-11-23 MED ORDER — VANCOMYCIN HCL IN DEXTROSE 750-5 MG/150ML-% IV SOLN
750.0000 mg | INTRAVENOUS | Status: DC
  Administered 2017-11-23: 750 mg via INTRAVENOUS
  Filled 2017-11-23: qty 150

## 2017-11-23 MED ORDER — LINACLOTIDE 145 MCG PO CAPS
290.0000 ug | ORAL_CAPSULE | Freq: Every day | ORAL | Status: DC
  Administered 2017-11-24 – 2017-11-30 (×6): 290 ug via ORAL
  Filled 2017-11-23 (×9): qty 2

## 2017-11-23 MED ORDER — WARFARIN - PHARMACIST DOSING INPATIENT
Freq: Every day | Status: DC
Start: 2017-11-24 — End: 2017-11-30
  Administered 2017-11-26: 17:00:00

## 2017-11-23 MED ORDER — ACETAMINOPHEN 325 MG PO TABS
650.0000 mg | ORAL_TABLET | Freq: Four times a day (QID) | ORAL | Status: DC | PRN
  Administered 2017-12-04 – 2017-12-09 (×3): 650 mg via ORAL
  Filled 2017-11-23 (×4): qty 2

## 2017-11-23 MED ORDER — ERYTHROMYCIN 5 MG/GM OP OINT
1.0000 "application " | TOPICAL_OINTMENT | Freq: Every day | OPHTHALMIC | Status: DC
  Administered 2017-11-23 – 2017-12-09 (×15): 1 via OPHTHALMIC
  Filled 2017-11-23 (×2): qty 3.5

## 2017-11-23 MED ORDER — MIDODRINE HCL 5 MG PO TABS
10.0000 mg | ORAL_TABLET | Freq: Three times a day (TID) | ORAL | Status: DC
  Administered 2017-11-23 – 2017-12-09 (×44): 10 mg via ORAL
  Filled 2017-11-23 (×45): qty 2

## 2017-11-23 MED ORDER — VANCOMYCIN HCL IN DEXTROSE 750-5 MG/150ML-% IV SOLN: 750.0000 mg | INTRAVENOUS | Status: DC

## 2017-11-23 MED ORDER — ADULT MULTIVITAMIN W/MINERALS CH
1.0000 | ORAL_TABLET | Freq: Every day | ORAL | Status: DC
  Administered 2017-11-23 – 2017-12-08 (×16): 1 via ORAL
  Filled 2017-11-23 (×16): qty 1

## 2017-11-23 MED ORDER — VANCOMYCIN HCL IN DEXTROSE 1-5 GM/200ML-% IV SOLN
1000.0000 mg | Freq: Once | INTRAVENOUS | Status: DC
  Filled 2017-11-23: qty 200

## 2017-11-23 NOTE — ED Notes (Signed)
Breakfast tray ordered 

## 2017-11-23 NOTE — Progress Notes (Signed)
Pharmacy Antibiotic Note  Shedrick Netz is a 84 y.o. male admitted on 11/22/2017 with possible UTI /  sepsis.  Pharmacy has been consulted for  aztreonam, dosing.  BCID with strep species, patient with PCN allergy.  Will add Vancomyicn and monitor Cx data, vancomycin 1gm x1 given last pm in ED   SCr 1.5,m CrCl ~74ml/min  Plan: Vancomycin 750mg  q24h aztreonam 500mg  IV Q8h Monitor clinical picture, renal function, VT prn F/U C&S, abx deescalation / LOT   Height: 5\' 6"  (167.6 cm) Weight: 137 lb (62.1 kg) IBW/kg (Calculated) : 63.8  Temp (24hrs), Avg:97.7 F (36.5 C), Min:96.6 F (35.9 C), Max:99.5 F (37.5 C)  Recent Labs  Lab 11/22/17 2037 11/22/17 2047 11/23/17 0106 11/23/17 0306  WBC 13.3*  --   --  10.5  CREATININE 1.50*  --   --  1.27*  LATICACIDVEN  --  1.20 0.9 1.0    Estimated Creatinine Clearance: 35.3 mL/min (A) (by C-G formula based on SCr of 1.27 mg/dL (H)).    Allergies  Allergen Reactions  . Adhesive [Tape] Other (See Comments)    THE PATIENT'S SKIN IS VERY THIN AND WILL TEAR AND BRUISE VERY EASILY!!  . Sunflower Oil Itching  . Penicillins Rash    Has patient had a PCN reaction causing immediate rash, facial/tongue/throat swelling, SOB or lightheadedness with hypotension: Yes Has patient had a PCN reaction causing severe rash involving mucus membranes or skin necrosis: Unk Has patient had a PCN reaction that required hospitalization: No Has patient had a PCN reaction occurring within the last 10 years: No If all of the above answers are "NO", then may proceed with Cephalosporin use.     Bonnita Nasuti Pharm.D. CPP, BCPS Clinical Pharmacist [phone] 11/23/2017 9:06 PM

## 2017-11-23 NOTE — Progress Notes (Signed)
ANTICOAGULATION CONSULT NOTE - Initial Consult  Pharmacy Consult for Warfarin  Indication: atrial fibrillation  Allergies  Allergen Reactions  . Adhesive [Tape] Other (See Comments)    THE PATIENT'S SKIN IS VERY THIN AND WILL TEAR AND BRUISE VERY EASILY!!  . Sunflower Oil Itching  . Penicillins Rash    Has patient had a PCN reaction causing immediate rash, facial/tongue/throat swelling, SOB or lightheadedness with hypotension: Yes Has patient had a PCN reaction causing severe rash involving mucus membranes or skin necrosis: Unk Has patient had a PCN reaction that required hospitalization: No Has patient had a PCN reaction occurring within the last 10 years: No If all of the above answers are "NO", then may proceed with Cephalosporin use.     Patient Measurements: Height: 5\' 6"  (167.6 cm) Weight: 137 lb (62.1 kg) IBW/kg (Calculated) : 63.8   Vital Signs: Temp: 97.9 F (36.6 C) (02/19 2126) Temp Source: Oral (02/19 2126) BP: 136/84 (02/19 2126) Pulse Rate: 48 (02/19 2126)  Labs: Recent Labs    11/22/17 2037 11/23/17 0306  HGB 11.1* 9.3*  HCT 32.7* 27.5*  PLT 136* 90*  LABPROT 18.7*  --   INR 1.58  --   CREATININE 1.50* 1.27*    Estimated Creatinine Clearance: 35.3 mL/min (A) (by C-G formula based on SCr of 1.27 mg/dL (H)).   Medical History: Past Medical History:  Diagnosis Date  . Atrial fibrillation ([place])   . Hypertension   . Skin cancer       Assessment: 89yom admitted with urinary retention and weakness> possible UTI Cx IP, strep species bacteremia.   Afib CVR Diltiazem PTA not continued here with low HR  INR 1.5 continue PTA warfarin 5mg  MWF / 7.5mg  TTSS  Goal of Therapy:  INR 2-3 Monitor platelets by anticoagulation protocol: Yes   Plan:  Warfarin 7.5mg  x1 tonight  Daily INR  Bonnita Nasuti Pharm.D. CPP, BCPS Clinical Pharmacist [phone] 11/23/2017 9:57 PM

## 2017-11-23 NOTE — ED Notes (Signed)
Niu, MD at bedside. °

## 2017-11-23 NOTE — ED Notes (Signed)
PT knew it was February but said year was 68. Said he was in [place] in [place] and said "you drive this thing well young lady!" when transported to another room. Currently resting.

## 2017-11-23 NOTE — ED Notes (Signed)
Pharmacy messaged about unverified meds 

## 2017-11-23 NOTE — Progress Notes (Signed)
PHARMACY - PHYSICIAN COMMUNICATION CRITICAL VALUE ALERT - BLOOD CULTURE IDENTIFICATION (BCID)  Results for orders placed or performed during the hospital encounter of 11/22/17  Blood Culture ID Panel (Reflexed) (Collected: 11/22/2017  8:00 PM)  Result Value Ref Range   Enterococcus species NOT DETECTED NOT DETECTED   Listeria monocytogenes NOT DETECTED NOT DETECTED   Staphylococcus species NOT DETECTED NOT DETECTED   Staphylococcus aureus NOT DETECTED NOT DETECTED   Streptococcus species DETECTED (A) NOT DETECTED   Streptococcus agalactiae NOT DETECTED NOT DETECTED   Streptococcus pneumoniae NOT DETECTED NOT DETECTED   Streptococcus pyogenes NOT DETECTED NOT DETECTED   Acinetobacter baumannii NOT DETECTED NOT DETECTED   Enterobacteriaceae species NOT DETECTED NOT DETECTED   Enterobacter cloacae complex NOT DETECTED NOT DETECTED   Escherichia coli NOT DETECTED NOT DETECTED   Klebsiella oxytoca NOT DETECTED NOT DETECTED   Klebsiella pneumoniae NOT DETECTED NOT DETECTED   Proteus species NOT DETECTED NOT DETECTED   Serratia marcescens NOT DETECTED NOT DETECTED   Haemophilus influenzae NOT DETECTED NOT DETECTED   Neisseria meningitidis NOT DETECTED NOT DETECTED   Pseudomonas aeruginosa NOT DETECTED NOT DETECTED   Candida albicans NOT DETECTED NOT DETECTED   Candida glabrata NOT DETECTED NOT DETECTED   Candida krusei NOT DETECTED NOT DETECTED   Candida parapsilosis NOT DETECTED NOT DETECTED   Candida tropicalis NOT DETECTED NOT DETECTED    Name of physician (or Provider) Contacted: K Schorr  Changes to prescribed antibiotics required: add vancomycin   Bonnita Nasuti Pharm.D. CPP, BCPS Clinical Pharmacist [phone] 11/23/2017 9:02 PM

## 2017-11-23 NOTE — Telephone Encounter (Signed)
Returned call to Renette RN with [facility] gave order to obtain an INR tomorrow on 11/24/17. Also, instructed her to call the [facility] at [phone] & if we don't answer leave a msg with a return phone number so we can call her back & she verbalized understanding.

## 2017-11-23 NOTE — Telephone Encounter (Signed)
New message    Renette R. Calling from [facility] calling to get order to draw PT/INR . Please call [phone] Fax # [phone]

## 2017-11-23 NOTE — Progress Notes (Signed)
Peter Moreno  Peter Moreno  ZOX:[id number] DOB: November 23, 1926 DOA: 11/22/2017 PCP: Cyndy Freeze, MD    Brief Narrative:  84 y.o. male with a history of hypertension, atrial fibrillation on Coumadin, skin cancer, dementia, and gout, who presented with acute difficulty urinating along w/ generalized weakness for 6 weeks.   In the ED the pt was found to have urinary retention and hematuria. A foley cath was placed.  UA was consistent w/ UTI, and WBC was 13.3.  Creatinine was 1.50 and BUN 54, temperature 103, and his initial blood pressure was 81/61.    Significant Events: 2/18 admit   Subjective: The pt is resting comfortably in bed.  He is alert and very pleasant, but is confused and unable to provide a reliable hx.  He is in no acute resp distress, and denies pain.    Assessment & Plan:  Sepsis due to UTI Hemodynamically stabilizing - cont to hydrate gently - cont empiric abx and f/u culture data as available   Hypotension  Resolved w/ volume expansion   Acute kidney injury Hydrate and f/u renal fxn in AM   Chronic Afib w/ acute RVR CHA2DS2-VASc Score is 3 - on chronic coumadin, pharmacy to continue - rate controlled at this time   Urinary retention I/O cath as required - foley cath if persists   Alzheimer's dementia  Hyponatremia   Due to hypovolemia - cont to hydrate and f/u in AM   AoS Noted on TTE - rated as mild as of July 2017  DVT prophylaxis: warfarin  Code Status: DNR - NO CODE Family Communication: no family present at time of exam  Disposition Plan: SDU  Consultants:  none  Antimicrobials:  Aztreonam 2/18 >  Objective: Blood pressure 113/81, pulse 85, temperature 98.2 F (36.8 C), resp. rate 14, height 5\' 6"  (1.676 m), weight 62.1 kg (137 lb), SpO2 98 %.  Intake/Output Summary (Last 24 hours) at 11/23/2017 1428 Last data filed at 11/23/2017 0716 Gross per 24 hour  Intake 4350 ml  Output 1150 ml  Net 3200 ml   Filed  Weights   11/22/17 1945 11/23/17 0748  Weight: 62.1 kg (137 lb) 62.1 kg (137 lb)    Examination: General: No acute respiratory distress Lungs: Clear to auscultation bilaterally without wheezes or crackles Cardiovascular: Regular rate and rhythm without murmur gallop or rub normal S1 and S2 Abdomen: Nontender, nondistended, soft, bowel sounds positive, no rebound, no ascites, no appreciable mass Extremities: No significant cyanosis, clubbing, or edema bilateral lower extremities  CBC: Recent Labs  Lab 11/22/17 2037 11/23/17 0306  WBC 13.3* 10.5  NEUTROABS 12.5*  --   HGB 11.1* 9.3*  HCT 32.7* 27.5*  MCV 94.0 94.8  PLT 136* 90*   Basic Metabolic Panel: Recent Labs  Lab 11/22/17 2037 11/23/17 0306  NA 129* 134*  K 4.4 3.9  CL 99* 109  CO2 18* 17*  GLUCOSE 108* 135*  BUN 54* 42*  CREATININE 1.50* 1.27*  CALCIUM 8.9 7.9*   GFR: Estimated Creatinine Clearance: 35.3 mL/min (A) (by C-G formula based on SCr of 1.27 mg/dL (H)).  Liver Function Tests: Recent Labs  Lab 11/22/17 2037  AST 20  ALT 11*  ALKPHOS 53  BILITOT 0.9  PROT 5.9*  ALBUMIN 3.3*    Coagulation Profile: Recent Labs  Lab 11/22/17 2037  INR 1.58    Scheduled Meds: . allopurinol  300 mg Oral Daily  . cholecalciferol  1,000 Units Oral Daily  . donepezil  10 mg Oral QHS  . erythromycin  1 application Both Eyes Daily  . linaclotide  290 mcg Oral QAC breakfast  . midodrine  10 mg Oral TID WC  . multivitamin with minerals  1 tablet Oral Q breakfast     LOS: 0 days   Cherene Altes, MD [facility] Office  [phone] Pager - Text Page per Shea Evans as per below:  On-Call/Text Page:      [email or web address]      password [email or web address]  If 7PM-7AM, please contact night-coverage [email or web address] Password [email or web address] 11/23/2017, 2:28 PM

## 2017-11-24 DIAGNOSIS — R339 Retention of urine, unspecified: Secondary | ICD-10-CM

## 2017-11-24 LAB — BASIC METABOLIC PANEL
Anion gap: 6 (ref 5–15)
BUN: 37 mg/dL — ABNORMAL HIGH (ref 6–20)
CHLORIDE: 115 mmol/L — AB (ref 101–111)
CO2: 16 mmol/L — AB (ref 22–32)
Calcium: 8.4 mg/dL — ABNORMAL LOW (ref 8.9–10.3)
Creatinine, Ser: 0.99 mg/dL (ref 0.61–1.24)
GFR calc non Af Amer: >60 mL/min (ref >60)
GLUCOSE: 98 mg/dL (ref 65–99)
Potassium: 3.7 mmol/L (ref 3.5–5.1)
Sodium: 137 mmol/L (ref 135–145)

## 2017-11-24 LAB — PROTIME-INR
INR: 1.84
Prothrombin Time: 21.1 seconds — ABNORMAL HIGH (ref 11.4–15.2)

## 2017-11-24 LAB — URINE CULTURE: CULTURE: NO GROWTH

## 2017-11-24 LAB — CBC
HEMATOCRIT: 29.6 % — AB (ref 39.0–52.0)
Hemoglobin: 10.1 g/dL — ABNORMAL LOW (ref 13.0–17.0)
MCH: 32 pg (ref 26.0–34.0)
MCHC: 34.1 g/dL (ref 30.0–36.0)
MCV: 93.7 fL (ref 78.0–100.0)
Platelets: 109 10*3/uL — ABNORMAL LOW (ref 150–400)
RBC: 3.16 MIL/uL — AB (ref 4.22–5.81)
RDW: 14.4 % (ref 11.5–15.5)
WBC: 7.2 10*3/uL (ref 4.0–10.5)

## 2017-11-24 MED ORDER — WARFARIN SODIUM 5 MG PO TABS
5.0000 mg | ORAL_TABLET | Freq: Once | ORAL | Status: AC
  Administered 2017-11-24: 5 mg via ORAL
  Filled 2017-11-24: qty 1

## 2017-11-24 MED ORDER — SODIUM CHLORIDE 0.9 % IV SOLN
2.0000 g | INTRAVENOUS | Status: DC
  Administered 2017-11-24 – 2017-12-07 (×14): 2 g via INTRAVENOUS
  Filled 2017-11-24 (×16): qty 20

## 2017-11-24 NOTE — Progress Notes (Signed)
ANTICOAGULATION CONSULT NOTE - Follow Up Consult  Pharmacy Consult for Warfarin  Indication: atrial fibrillation  Allergies  Allergen Reactions  . Adhesive [Tape] Other (See Comments)    THE PATIENT'S SKIN IS VERY THIN AND WILL TEAR AND BRUISE VERY EASILY!!  . Sunflower Oil Itching  . Penicillins Rash    Has patient had a PCN reaction causing immediate rash, facial/tongue/throat swelling, SOB or lightheadedness with hypotension: Yes Has patient had a PCN reaction causing severe rash involving mucus membranes or skin necrosis: Unk Has patient had a PCN reaction that required hospitalization: No Has patient had a PCN reaction occurring within the last 10 years: No If all of the above answers are "NO", then may proceed with Cephalosporin use.     Patient Measurements: Height: 5\' 6"  (167.6 cm) Weight: 137 lb (62.1 kg) IBW/kg (Calculated) : 63.8   Vital Signs: Temp: 97.2 F (36.2 C) (02/20 0417) Temp Source: Oral (02/20 0417) BP: 133/81 (02/20 0417) Pulse Rate: 97 (02/20 0417)  Labs: Recent Labs    11/22/17 2037 11/23/17 0306 11/24/17 0321  HGB 11.1* 9.3* 10.1*  HCT 32.7* 27.5* 29.6*  PLT 136* 90* 109*  LABPROT 18.7*  --  21.1*  INR 1.58  --  1.84  CREATININE 1.50* 1.27* 0.99    Estimated Creatinine Clearance: 45.3 mL/min (by C-G formula based on SCr of 0.99 mg/dL).   Medical History: Past Medical History:  Diagnosis Date  . Atrial fibrillation ([place])   . Hypertension   . Skin cancer    Assessment: 89yom admitted with urinary retention and weakness> possible UTI Cx IP, strep species bacteremia.    INR was 1.58 on admission, increased to 1.84 today. Hgb stable ~10, platelets trending upward to 109. PTA warfarin regimen is 5mg  MWF / 7.5mg  TTSS. On concurrent antibiotics - which can increase warfarin sensitivity.   Goal of Therapy:  INR 2-3 Monitor platelets by anticoagulation protocol: Yes   Plan:  Warfarin 5 mg x1 tonight  Daily INR  Doylene Canard,  PharmD Clinical Pharmacist  Pager: [phone] Clinical Phone for 11/24/2017 until 3:30pm: [phone] If after 3:30pm, please call main pharmacy at [phone] 11/24/2017 8:33 AM

## 2017-11-24 NOTE — Progress Notes (Addendum)
PROGRESS NOTE    Peter Moreno  IDP:[id number] DOB: 06-20-1927 DOA: 11/22/2017 PCP: Peter Freeze, MD   Chief Complaint  Patient presents with  . Weakness    Brief Narrative:  HPI On 11/22/2017 by Dr. Ivor Moreno Peter Moreno is a 84 y.o. male with medical history significant of hypertension, atrial fibrillation on Coumadin, skin cancer, dementia, gout, who presents with difficulty urinating and urge for urination.  Per his son, patient has been having generalized weakness in the past 6 weeks. His son states that the patient has generalized weakness, but no unilateral weakness or numbness in extremities. Patient seems to have mild right facial droop, but his son states that is normal to patient due to missing teeth. In the past several days, patient has been having difficult urinating. He has urge for urination all the time. He does not seem to have dysuria per his son. Patient does not have chest pain, SOB or cough. His son states that his blood pressure has been running low in the past 6 weeks. He was seen in the ED of [place] and was started with Midodrine, without clear diagnosis. Patient does not have nausea, vomiting or diarrhea. Pt had one or 2 episodes of confusion recently, but not today. Pt was found to have urinary retention and hematuria in ED. Foley cath was placed.  Interim history Admitted for sepsis secondary to UTI.  Also found to have strep bacteremia.  Assessment & Plan   Sepsis secondary to UTI/strep bacteremia -Patient presented with leukocytosis, hypotension and was febrile -UA: WBC TNTC, no bacteria seen, moderate leukocytes -Urine culture show no growth -Blood culture 11/22/2017: 2/2 strep species, sensitivities pending  -Initially placed on aztreonam due to penicillin allergy as well as vancomycin -transitioned to ceftriaxone  Hypotension -Per family has been ongoing for approximately 6 weeks -Patient was recently started on midodrine -Etiology unknown -Was  given dose of Solu-Cortef upon admission -BP does appear to be improved  Acute kidney injury on chronic kidney disease, stage III -Creatinine on admission 1.50, baseline approximately 1.2 -Currently 0.99 -Continue to monitor BMP  Atrial fibrillation -CHADSVASC 3 -on coumadin, INR 1.84  Urinary retention -Continue in and out cath as required, may consider Foley catheter if this persists  Essential hypertension -As noted above, patient has been hypotensive -Lotensin held  Dementia -Continue benazepril  Hyponatremia -resolved, continue to monitor BMP  Aortic stenosis -Noted on echocardiogram in July 2017, mild   DVT Prophylaxis Coumadin  Code Status: DNR  Family Communication: None at bedside  Disposition Plan: Admitted, continue to monitor in stepdown  Consultants None  Procedures  None  Antibiotics   Anti-infectives (From admission, onward)   Start     Dose/Rate Route Frequency Ordered Stop   11/24/17 2200  vancomycin (VANCOCIN) IVPB 750 mg/150 ml premix  Status:  Discontinued     750 mg 150 mL/hr over 60 Minutes Intravenous Every 24 hours 11/23/17 2101 11/23/17 2105   11/24/17 2000  levofloxacin (LEVAQUIN) IVPB 750 mg  Status:  Discontinued     750 mg 100 mL/hr over 90 Minutes Intravenous Every 48 hours 11/22/17 2136 11/23/17 0038   11/24/17 1200  cefTRIAXone (ROCEPHIN) 2 g in sodium chloride 0.9 % 100 mL IVPB     2 g 200 mL/hr over 30 Minutes Intravenous Every 24 hours 11/24/17 1031     11/23/17 2200  vancomycin (VANCOCIN) IVPB 750 mg/150 ml premix  Status:  Discontinued     750 mg 150 mL/hr over 60 Minutes Intravenous Every  24 hours 11/22/17 2136 11/23/17 0038   11/23/17 2200  vancomycin (VANCOCIN) IVPB 750 mg/150 ml premix  Status:  Discontinued     750 mg 150 mL/hr over 60 Minutes Intravenous Every 24 hours 11/23/17 2105 11/24/17 1031   11/23/17 2115  vancomycin (VANCOCIN) IVPB 1000 mg/200 mL premix  Status:  Discontinued     1,000 mg 200 mL/hr over  60 Minutes Intravenous  Once 11/23/17 2101 11/23/17 2105   11/23/17 0600  aztreonam (AZACTAM) 1 g in sodium chloride 0.9 % 100 mL IVPB  Status:  Discontinued     1 g 200 mL/hr over 30 Minutes Intravenous Every 8 hours 11/22/17 2136 11/24/17 1031   11/22/17 2000  levofloxacin (LEVAQUIN) IVPB 750 mg     750 mg 100 mL/hr over 90 Minutes Intravenous  Once 11/22/17 1958 11/22/17 2315   11/22/17 2000  aztreonam (AZACTAM) 2 g in sodium chloride 0.9 % 100 mL IVPB     2 g 200 mL/hr over 30 Minutes Intravenous  Once 11/22/17 1958 11/23/17 0022   11/22/17 2000  vancomycin (VANCOCIN) IVPB 1000 mg/200 mL premix     1,000 mg 200 mL/hr over 60 Minutes Intravenous  Once 11/22/17 1958 11/23/17 0139      Subjective:   Peter Moreno seen and examined today.  Currently has no complaints.  Patient knows he is in the hospital as he has not been feeling well.  Currently denies chest pain, shortness of breath, abdominal pain, nausea or vomiting, diarrhea or constipation.  Denies any pain with urination.  Objective:   Vitals:   11/23/17 2324 11/24/17 0417 11/24/17 0843 11/24/17 1058  BP: 138/83 133/81  (!) 141/74  Pulse: 87 97  85  Resp: 14 10  17   Temp: 97.7 F (36.5 C) (!) 97.2 F (36.2 C) 97.6 F (36.4 C) 97.7 F (36.5 C)  TempSrc: Oral Oral  Oral  SpO2: 99% 98%  97%  Weight:      Height:        Intake/Output Summary (Last 24 hours) at 11/24/2017 1520 Last data filed at 11/24/2017 1200 Gross per 24 hour  Intake 1472.5 ml  Output 1081 ml  Net 391.5 ml   Filed Weights   11/22/17 1945 11/23/17 0748  Weight: 62.1 kg (137 lb) 62.1 kg (137 lb)    Exam  General: Well developed, elderly, thin, in no apparent distress  HEENT: NCAT, mucous membranes moist.   Neck: Supple, no JVD  Cardiovascular: S1 S2 auscultated,irregular, 1/6 SEM  Respiratory: Clear to auscultation bilaterally with equal chest rise  Abdomen: Soft, nontender, nondistended, + bowel sounds  Extremities: warm dry without  cyanosis clubbing or edema  Neuro: AAOx3, with history of dementia, nonfocal  Psych: pleasant, appropriate mood and affect   Data Reviewed: I have personally reviewed following labs and imaging studies  CBC: Recent Labs  Lab 11/22/17 2037 11/23/17 0306 11/24/17 0321  WBC 13.3* 10.5 7.2  NEUTROABS 12.5*  --   --   HGB 11.1* 9.3* 10.1*  HCT 32.7* 27.5* 29.6*  MCV 94.0 94.8 93.7  PLT 136* 90* 182*   Basic Metabolic Panel: Recent Labs  Lab 11/22/17 2037 11/23/17 0306 11/24/17 0321  NA 129* 134* 137  K 4.4 3.9 3.7  CL 99* 109 115*  CO2 18* 17* 16*  GLUCOSE 108* 135* 98  BUN 54* 42* 37*  CREATININE 1.50* 1.27* 0.99  CALCIUM 8.9 7.9* 8.4*   GFR: Estimated Creatinine Clearance: 45.3 mL/min (by C-G formula based on SCr of  0.99 mg/dL). Liver Function Tests: Recent Labs  Lab 11/22/17 2037  AST 20  ALT 11*  ALKPHOS 53  BILITOT 0.9  PROT 5.9*  ALBUMIN 3.3*   No results for input(s): LIPASE, AMYLASE in the last 168 hours. No results for input(s): AMMONIA in the last 168 hours. Coagulation Profile: Recent Labs  Lab 11/22/17 2037 11/24/17 0321  INR 1.58 1.84   Cardiac Enzymes: No results for input(s): CKTOTAL, CKMB, CKMBINDEX, TROPONINI in the last 168 hours. BNP (last 3 results) No results for input(s): PROBNP in the last 8760 hours. HbA1C: No results for input(s): HGBA1C in the last 72 hours. CBG: No results for input(s): GLUCAP in the last 168 hours. Lipid Profile: No results for input(s): CHOL, HDL, LDLCALC, TRIG, CHOLHDL, LDLDIRECT in the last 72 hours. Thyroid Function Tests: Recent Labs    11/23/17 0306  TSH 1.156  FREET4 1.08   Anemia Panel: No results for input(s): VITAMINB12, FOLATE, FERRITIN, TIBC, IRON, RETICCTPCT in the last 72 hours. Urine analysis:    Component Value Date/Time   COLORURINE YELLOW 11/22/2017 2227   APPEARANCEUR HAZY (A) 11/22/2017 2227   LABSPEC 1.011 11/22/2017 2227   PHURINE 6.0 11/22/2017 2227   GLUCOSEU NEGATIVE  11/22/2017 2227   HGBUR LARGE (A) 11/22/2017 2227   BILIRUBINUR NEGATIVE 11/22/2017 2227   KETONESUR NEGATIVE 11/22/2017 2227   PROTEINUR NEGATIVE 11/22/2017 2227   NITRITE NEGATIVE 11/22/2017 2227   LEUKOCYTESUR MODERATE (A) 11/22/2017 2227   Sepsis Labs: @LABRCNTIP (procalcitonin:4,lacticidven:4)  ) Recent Results (from the past 240 hour(s))  Culture, blood (Routine x 2)     Status: None (Preliminary result)   Collection Time: 11/22/17  8:00 PM  Result Value Ref Range Status   Specimen Description BLOOD LEFT ANTECUBITAL  Final   Special Requests   Final    BOTTLES DRAWN AEROBIC AND ANAEROBIC Blood Culture results may not be optimal due to an excessive volume of blood received in culture bottles   Culture  Setup Time   Final    GRAM POSITIVE COCCI IN BOTH AEROBIC AND ANAEROBIC BOTTLES CRITICAL RESULT CALLED TO, READ BACK BY AND VERIFIED WITHBronwen Betters [facility] 4098 11/23/17 A BROWNING Performed at [facility], [place] [address], [place], [place] [address]    Culture GRAM POSITIVE COCCI  Final   Report Status PENDING  Incomplete  Blood Culture ID Panel (Reflexed)     Status: Abnormal   Collection Time: 11/22/17  8:00 PM  Result Value Ref Range Status   Enterococcus species NOT DETECTED NOT DETECTED Final   Listeria monocytogenes NOT DETECTED NOT DETECTED Final   Staphylococcus species NOT DETECTED NOT DETECTED Final   Staphylococcus aureus NOT DETECTED NOT DETECTED Final   Streptococcus species DETECTED (A) NOT DETECTED Final    Comment: Not Enterococcus species, Streptococcus agalactiae, Streptococcus pyogenes, or Streptococcus pneumoniae. CRITICAL RESULT CALLED TO, READ BACK BY AND VERIFIED WITH: Bronwen Betters PHARMD [id number] 11/23/17 A BROWNING    Streptococcus agalactiae NOT DETECTED NOT DETECTED Final   Streptococcus pneumoniae NOT DETECTED NOT DETECTED Final   Streptococcus pyogenes NOT DETECTED NOT DETECTED Final   Acinetobacter baumannii NOT DETECTED NOT DETECTED Final    Enterobacteriaceae species NOT DETECTED NOT DETECTED Final   Enterobacter cloacae complex NOT DETECTED NOT DETECTED Final   Escherichia coli NOT DETECTED NOT DETECTED Final   Klebsiella oxytoca NOT DETECTED NOT DETECTED Final   Klebsiella pneumoniae NOT DETECTED NOT DETECTED Final   Proteus species NOT DETECTED NOT DETECTED Final   Serratia marcescens NOT  DETECTED NOT DETECTED Final   Haemophilus influenzae NOT DETECTED NOT DETECTED Final   Neisseria meningitidis NOT DETECTED NOT DETECTED Final   Pseudomonas aeruginosa NOT DETECTED NOT DETECTED Final   Candida albicans NOT DETECTED NOT DETECTED Final   Candida glabrata NOT DETECTED NOT DETECTED Final   Candida krusei NOT DETECTED NOT DETECTED Final   Candida parapsilosis NOT DETECTED NOT DETECTED Final   Candida tropicalis NOT DETECTED NOT DETECTED Final    Comment: Performed at [facility], [place] [address], [place], [place] [address]  Culture, blood (Routine x 2)     Status: None (Preliminary result)   Collection Time: 11/22/17  8:02 PM  Result Value Ref Range Status   Specimen Description BLOOD RIGHT ANTECUBITAL  Final   Special Requests   Final    BOTTLES DRAWN AEROBIC AND ANAEROBIC Blood Culture adequate volume   Culture  Setup Time   Final    GRAM POSITIVE COCCI IN BOTH AEROBIC AND ANAEROBIC BOTTLES CRITICAL RESULT CALLED TO, READ BACK BY AND VERIFIED WITHBronwen Betters [facility] 6045 11/23/17 A BROWNING Performed at [facility], [place] [address] [place], [place] [address]    Culture GRAM POSITIVE COCCI  Final   Report Status PENDING  Incomplete  Urine culture     Status: None   Collection Time: 11/22/17  8:36 PM  Result Value Ref Range Status   Specimen Description URINE, CATHETERIZED  Final   Special Requests NONE  Final   Culture   Final    NO GROWTH Performed at [facility], [address] [place], [place] [address]    Report Status 11/24/2017 FINAL  Final      Radiology Studies: Dg Chest 2  View  Result Date: 11/22/2017 CLINICAL DATA:  Sepsis EXAM: CHEST  2 VIEW COMPARISON:  10/31/2017 FINDINGS: The heart is mildly enlarged but stable. There is moderate tortuosity and calcification of the thoracic aorta. Chronic bronchitic and emphysematous lung changes but no definite infiltrates, edema or effusions. The bony thorax is intact. IMPRESSION: Mild stable cardiac enlargement. Chronic emphysematous and bronchitic lung changes without acute overlying pulmonary process. Electronically Signed   By: Marijo Sanes M.D.   On: 11/22/2017 21:21     Scheduled Meds: . allopurinol  300 mg Oral Daily  . cholecalciferol  1,000 Units Oral Daily  . donepezil  10 mg Oral QHS  . erythromycin  1 application Both Eyes Daily  . linaclotide  290 mcg Oral QAC breakfast  . midodrine  10 mg Oral TID WC  . multivitamin with minerals  1 tablet Oral Q breakfast  . warfarin  5 mg Oral ONCE-1800  . Warfarin - Pharmacist Dosing Inpatient   Does not apply q1800   Continuous Infusions: . sodium chloride 1,000 mL (11/23/17 2002)  . cefTRIAXone (ROCEPHIN)  IV       LOS: 1 day   Time Spent in minutes   30 minutes  Gyselle Matthew D.O. on 11/24/2017 at 3:20 PM  Between 7am to 7pm - Pager - [phone]  After 7pm go to [email or web address] - password [email or web address]  And look for the night coverage person covering for me after hours  [facility] Office  [phone]

## 2017-11-25 ENCOUNTER — Encounter (HOSPITAL_COMMUNITY): Payer: Self-pay | Admitting: Student

## 2017-11-25 ENCOUNTER — Inpatient Hospital Stay (HOSPITAL_COMMUNITY): Payer: Medicare Other

## 2017-11-25 DIAGNOSIS — I340 Nonrheumatic mitral (valve) insufficiency: Secondary | ICD-10-CM

## 2017-11-25 DIAGNOSIS — I361 Nonrheumatic tricuspid (valve) insufficiency: Secondary | ICD-10-CM

## 2017-11-25 LAB — CULTURE, BLOOD (ROUTINE X 2): Special Requests: ADEQUATE

## 2017-11-25 LAB — ECHOCARDIOGRAM COMPLETE
HEIGHTINCHES: 66 in
Weight: 2192 oz

## 2017-11-25 LAB — CBC
HEMATOCRIT: 32 % — AB (ref 39.0–52.0)
HEMOGLOBIN: 11 g/dL — AB (ref 13.0–17.0)
MCH: 32.3 pg (ref 26.0–34.0)
MCHC: 34.4 g/dL (ref 30.0–36.0)
MCV: 93.8 fL (ref 78.0–100.0)
Platelets: 128 10*3/uL — ABNORMAL LOW (ref 150–400)
RBC: 3.41 MIL/uL — ABNORMAL LOW (ref 4.22–5.81)
RDW: 14.5 % (ref 11.5–15.5)
WBC: 8.1 10*3/uL (ref 4.0–10.5)

## 2017-11-25 LAB — BASIC METABOLIC PANEL
ANION GAP: 7 (ref 5–15)
BUN: 28 mg/dL — ABNORMAL HIGH (ref 6–20)
CO2: 17 mmol/L — AB (ref 22–32)
Calcium: 8.2 mg/dL — ABNORMAL LOW (ref 8.9–10.3)
Chloride: 111 mmol/L (ref 101–111)
Creatinine, Ser: 0.93 mg/dL (ref 0.61–1.24)
GFR calc Af Amer: >60 mL/min (ref >60)
GFR calc non Af Amer: >60 mL/min (ref >60)
GLUCOSE: 86 mg/dL (ref 65–99)
POTASSIUM: 3.8 mmol/L (ref 3.5–5.1)
Sodium: 135 mmol/L (ref 135–145)

## 2017-11-25 LAB — PROTIME-INR
INR: 1.9
Prothrombin Time: 21.7 seconds — ABNORMAL HIGH (ref 11.4–15.2)

## 2017-11-25 MED ORDER — WARFARIN SODIUM 7.5 MG PO TABS
7.5000 mg | ORAL_TABLET | Freq: Once | ORAL | Status: AC
  Administered 2017-11-25: 7.5 mg via ORAL
  Filled 2017-11-25: qty 1

## 2017-11-25 MED ORDER — METOPROLOL TARTRATE 5 MG/5ML IV SOLN
5.0000 mg | Freq: Once | INTRAVENOUS | Status: AC
  Administered 2017-11-25: 5 mg via INTRAVENOUS
  Filled 2017-11-25: qty 5

## 2017-11-25 NOTE — Progress Notes (Signed)
PROGRESS NOTE    Peter Moreno  HMC:[id number] DOB: June 03, 1927 DOA: 11/22/2017 PCP: Cyndy Freeze, MD   Chief Complaint  Patient presents with  . Weakness    Brief Narrative:  HPI On 11/22/2017 by Dr. Ivor Costa Peter Moreno is a 84 y.o. male with medical history significant of hypertension, atrial fibrillation on Coumadin, skin cancer, dementia, gout, who presents with difficulty urinating and urge for urination.  Per his son, patient has been having generalized weakness in the past 6 weeks. His son states that the patient has generalized weakness, but no unilateral weakness or numbness in extremities. Patient seems to have mild right facial droop, but his son states that is normal to patient due to missing teeth. In the past several days, patient has been having difficult urinating. He has urge for urination all the time. He does not seem to have dysuria per his son. Patient does not have chest pain, SOB or cough. His son states that his blood pressure has been running low in the past 6 weeks. He was seen in the ED of [place] and was started with Midodrine, without clear diagnosis. Patient does not have nausea, vomiting or diarrhea. Pt had one or 2 episodes of confusion recently, but not today. Pt was found to have urinary retention and hematuria in ED. Foley cath was placed.  Interim history Admitted for sepsis secondary to UTI.  Also found to have strep bacteremia.  Assessment & Plan   Sepsis secondary to UTI/strep bacteremia -Patient presented with leukocytosis, hypotension and was febrile -leukocytosis and fever have resolved -UA: WBC TNTC, no bacteria seen, moderate leukocytes -Urine culture show no growth -Blood culture 11/22/2017: 2/2 strep species, sensitivities pending  -Initially placed on aztreonam due to penicillin allergy as well as vancomycin -transitioned to ceftriaxone  Hypotension -resolved -Per family has been ongoing for approximately 6 weeks -Patient was recently  started on midodrine -Etiology unknown -Was given dose of Solu-Cortef upon admission -BP does appear to be improved  Acute kidney injury on chronic kidney disease, stage III -resolved -Creatinine on admission 1.50, baseline approximately 1.2 -Currently 0.93 -Continue to monitor BMP  Atrial fibrillation -CHADSVASC 3 -on coumadin, INR 1.84  Urinary retention -Continue in and out cath as required, may consider Foley catheter if this persists  Essential hypertension -As noted above, patient has been hypotensive -Lotensin held, may restart today  Dementia -Continue donepezil   Hyponatremia -resolved, continue to monitor BMP  Aortic stenosis -Noted on echocardiogram in July 2017, mild   DVT Prophylaxis Coumadin  Code Status: DNR  Family Communication: None at bedside  Disposition Plan: Admitted, continue to monitor in stepdown. Pending sensitivities and repeat cultures.  Consultants None  Procedures  None  Antibiotics   Anti-infectives (From admission, onward)   Start     Dose/Rate Route Frequency Ordered Stop   11/24/17 2200  vancomycin (VANCOCIN) IVPB 750 mg/150 ml premix  Status:  Discontinued     750 mg 150 mL/hr over 60 Minutes Intravenous Every 24 hours 11/23/17 2101 11/23/17 2105   11/24/17 2000  levofloxacin (LEVAQUIN) IVPB 750 mg  Status:  Discontinued     750 mg 100 mL/hr over 90 Minutes Intravenous Every 48 hours 11/22/17 2136 11/23/17 0038   11/24/17 1200  cefTRIAXone (ROCEPHIN) 2 g in sodium chloride 0.9 % 100 mL IVPB     2 g 200 mL/hr over 30 Minutes Intravenous Every 24 hours 11/24/17 1031     11/23/17 2200  vancomycin (VANCOCIN) IVPB 750 mg/150 ml premix  Status:  Discontinued     750 mg 150 mL/hr over 60 Minutes Intravenous Every 24 hours 11/22/17 2136 11/23/17 0038   11/23/17 2200  vancomycin (VANCOCIN) IVPB 750 mg/150 ml premix  Status:  Discontinued     750 mg 150 mL/hr over 60 Minutes Intravenous Every 24 hours 11/23/17 2105 11/24/17 1031     11/23/17 2115  vancomycin (VANCOCIN) IVPB 1000 mg/200 mL premix  Status:  Discontinued     1,000 mg 200 mL/hr over 60 Minutes Intravenous  Once 11/23/17 2101 11/23/17 2105   11/23/17 0600  aztreonam (AZACTAM) 1 g in sodium chloride 0.9 % 100 mL IVPB  Status:  Discontinued     1 g 200 mL/hr over 30 Minutes Intravenous Every 8 hours 11/22/17 2136 11/24/17 1031   11/22/17 2000  levofloxacin (LEVAQUIN) IVPB 750 mg     750 mg 100 mL/hr over 90 Minutes Intravenous  Once 11/22/17 1958 11/22/17 2315   11/22/17 2000  aztreonam (AZACTAM) 2 g in sodium chloride 0.9 % 100 mL IVPB     2 g 200 mL/hr over 30 Minutes Intravenous  Once 11/22/17 1958 11/23/17 0022   11/22/17 2000  vancomycin (VANCOCIN) IVPB 1000 mg/200 mL premix     1,000 mg 200 mL/hr over 60 Minutes Intravenous  Once 11/22/17 1958 11/23/17 0139      Subjective:   Peter Moreno seen and examined today.  Patient states he feels tired.  Denies chest pain, shortness of breath, abdominal pain, nausea or vomiting, diarrhea or constipation, dizziness or headache.  Objective:   Vitals:   11/24/17 1637 11/24/17 2015 11/25/17 0015 11/25/17 0340  BP: (!) 141/84 128/86 (!) 132/94 114/87  Pulse: (!) 122 71 (!) 103 88  Resp: 18 15 18 19   Temp: (!) 97.5 F (36.4 C) 97.9 F (36.6 C) 98 F (36.7 C) (!) 97.5 F (36.4 C)  TempSrc: Oral Oral Axillary Oral  SpO2: 100% 98% 98% 99%  Weight:      Height:        Intake/Output Summary (Last 24 hours) at 11/25/2017 0739 Last data filed at 11/25/2017 0630 Gross per 24 hour  Intake 2280 ml  Output 2676 ml  Net -396 ml   Filed Weights   11/22/17 1945 11/23/17 0748  Weight: 62.1 kg (137 lb) 62.1 kg (137 lb)   Exam  General: Well developed, elderly, thin, no apparent distress  HEENT: NCAT,  mucous membranes moist.   Neck: Supple, no JVD  Cardiovascular: S1 S2 auscultated, IRR, soft SEM  Respiratory: Clear to auscultation bilaterally with equal chest rise, no wheezing  Abdomen: Soft,  nontender, nondistended, + bowel sounds  Extremities: warm dry without cyanosis clubbing or edema  Neuro: AAOx3, nonfocal  Psych: appropriate mood and affect, pleasant  Data Reviewed: I have personally reviewed following labs and imaging studies  CBC: Recent Labs  Lab 11/22/17 2037 11/23/17 0306 11/24/17 0321 11/25/17 0237  WBC 13.3* 10.5 7.2 8.1  NEUTROABS 12.5*  --   --   --   HGB 11.1* 9.3* 10.1* 11.0*  HCT 32.7* 27.5* 29.6* 32.0*  MCV 94.0 94.8 93.7 93.8  PLT 136* 90* 109* 409*   Basic Metabolic Panel: Recent Labs  Lab 11/22/17 2037 11/23/17 0306 11/24/17 0321 11/25/17 0237  NA 129* 134* 137 135  K 4.4 3.9 3.7 3.8  CL 99* 109 115* 111  CO2 18* 17* 16* 17*  GLUCOSE 108* 135* 98 86  BUN 54* 42* 37* 28*  CREATININE 1.50* 1.27* 0.99 0.93  CALCIUM 8.9 7.9* 8.4* 8.2*   GFR: Estimated Creatinine Clearance: 48.2 mL/min (by C-G formula based on SCr of 0.93 mg/dL). Liver Function Tests: Recent Labs  Lab 11/22/17 2037  AST 20  ALT 11*  ALKPHOS 53  BILITOT 0.9  PROT 5.9*  ALBUMIN 3.3*   No results for input(s): LIPASE, AMYLASE in the last 168 hours. No results for input(s): AMMONIA in the last 168 hours. Coagulation Profile: Recent Labs  Lab 11/22/17 2037 11/24/17 0321 11/25/17 0237  INR 1.58 1.84 1.90   Cardiac Enzymes: No results for input(s): CKTOTAL, CKMB, CKMBINDEX, TROPONINI in the last 168 hours. BNP (last 3 results) No results for input(s): PROBNP in the last 8760 hours. HbA1C: No results for input(s): HGBA1C in the last 72 hours. CBG: No results for input(s): GLUCAP in the last 168 hours. Lipid Profile: No results for input(s): CHOL, HDL, LDLCALC, TRIG, CHOLHDL, LDLDIRECT in the last 72 hours. Thyroid Function Tests: Recent Labs    11/23/17 0306  TSH 1.156  FREET4 1.08   Anemia Panel: No results for input(s): VITAMINB12, FOLATE, FERRITIN, TIBC, IRON, RETICCTPCT in the last 72 hours. Urine analysis:    Component Value Date/Time    COLORURINE YELLOW 11/22/2017 2227   APPEARANCEUR HAZY (A) 11/22/2017 2227   LABSPEC 1.011 11/22/2017 2227   PHURINE 6.0 11/22/2017 2227   GLUCOSEU NEGATIVE 11/22/2017 2227   HGBUR LARGE (A) 11/22/2017 2227   BILIRUBINUR NEGATIVE 11/22/2017 2227   KETONESUR NEGATIVE 11/22/2017 2227   PROTEINUR NEGATIVE 11/22/2017 2227   NITRITE NEGATIVE 11/22/2017 2227   LEUKOCYTESUR MODERATE (A) 11/22/2017 2227   Sepsis Labs: @LABRCNTIP (procalcitonin:4,lacticidven:4)  ) Recent Results (from the past 240 hour(s))  Culture, blood (Routine x 2)     Status: None (Preliminary result)   Collection Time: 11/22/17  8:00 PM  Result Value Ref Range Status   Specimen Description BLOOD LEFT ANTECUBITAL  Final   Special Requests   Final    BOTTLES DRAWN AEROBIC AND ANAEROBIC Blood Culture results may not be optimal due to an excessive volume of blood received in culture bottles   Culture  Setup Time   Final    GRAM POSITIVE COCCI IN BOTH AEROBIC AND ANAEROBIC BOTTLES CRITICAL RESULT CALLED TO, READ BACK BY AND VERIFIED WITHBronwen Betters [facility] 3825 11/23/17 A BROWNING Performed at [facility], [place] [address], [place], [place] [address]    Culture GRAM POSITIVE COCCI  Final   Report Status PENDING  Incomplete  Blood Culture ID Panel (Reflexed)     Status: Abnormal   Collection Time: 11/22/17  8:00 PM  Result Value Ref Range Status   Enterococcus species NOT DETECTED NOT DETECTED Final   Listeria monocytogenes NOT DETECTED NOT DETECTED Final   Staphylococcus species NOT DETECTED NOT DETECTED Final   Staphylococcus aureus NOT DETECTED NOT DETECTED Final   Streptococcus species DETECTED (A) NOT DETECTED Final    Comment: Not Enterococcus species, Streptococcus agalactiae, Streptococcus pyogenes, or Streptococcus pneumoniae. CRITICAL RESULT CALLED TO, READ BACK BY AND VERIFIED WITH: Bronwen Betters PHARMD [id number] 11/23/17 A BROWNING    Streptococcus agalactiae NOT DETECTED NOT DETECTED Final   Streptococcus  pneumoniae NOT DETECTED NOT DETECTED Final   Streptococcus pyogenes NOT DETECTED NOT DETECTED Final   Acinetobacter baumannii NOT DETECTED NOT DETECTED Final   Enterobacteriaceae species NOT DETECTED NOT DETECTED Final   Enterobacter cloacae complex NOT DETECTED NOT DETECTED Final   Escherichia coli NOT DETECTED NOT DETECTED Final   Klebsiella oxytoca NOT DETECTED NOT DETECTED Final  Klebsiella pneumoniae NOT DETECTED NOT DETECTED Final   Proteus species NOT DETECTED NOT DETECTED Final   Serratia marcescens NOT DETECTED NOT DETECTED Final   Haemophilus influenzae NOT DETECTED NOT DETECTED Final   Neisseria meningitidis NOT DETECTED NOT DETECTED Final   Pseudomonas aeruginosa NOT DETECTED NOT DETECTED Final   Candida albicans NOT DETECTED NOT DETECTED Final   Candida glabrata NOT DETECTED NOT DETECTED Final   Candida krusei NOT DETECTED NOT DETECTED Final   Candida parapsilosis NOT DETECTED NOT DETECTED Final   Candida tropicalis NOT DETECTED NOT DETECTED Final    Comment: Performed at [facility], [place] [address] [place], [place] [address]  Culture, blood (Routine x 2)     Status: None (Preliminary result)   Collection Time: 11/22/17  8:02 PM  Result Value Ref Range Status   Specimen Description BLOOD RIGHT ANTECUBITAL  Final   Special Requests   Final    BOTTLES DRAWN AEROBIC AND ANAEROBIC Blood Culture adequate volume   Culture  Setup Time   Final    GRAM POSITIVE COCCI IN BOTH AEROBIC AND ANAEROBIC BOTTLES CRITICAL RESULT CALLED TO, READ BACK BY AND VERIFIED WITHBronwen Betters [facility] 0814 11/23/17 A BROWNING Performed at [facility], [place] [address], [place], [place] [address]    Culture GRAM POSITIVE COCCI  Final   Report Status PENDING  Incomplete  Urine culture     Status: None   Collection Time: 11/22/17  8:36 PM  Result Value Ref Range Status   Specimen Description URINE, CATHETERIZED  Final   Special Requests NONE  Final   Culture   Final    NO  GROWTH Performed at [facility], [address] [place], [place] [address]    Report Status 11/24/2017 FINAL  Final      Radiology Studies: No results found.   Scheduled Meds: . allopurinol  300 mg Oral Daily  . cholecalciferol  1,000 Units Oral Daily  . donepezil  10 mg Oral QHS  . erythromycin  1 application Both Eyes Daily  . linaclotide  290 mcg Oral QAC breakfast  . midodrine  10 mg Oral TID WC  . multivitamin with minerals  1 tablet Oral Q breakfast  . Warfarin - Pharmacist Dosing Inpatient   Does not apply q1800   Continuous Infusions: . sodium chloride 1,000 mL (11/23/17 2002)  . cefTRIAXone (ROCEPHIN)  IV Stopped (11/24/17 1505)     LOS: 2 days   Time Spent in minutes   30 minutes  Carloyn Lahue D.O. on 11/25/2017 at 7:39 AM  Between 7am to 7pm - Pager - [phone]  After 7pm go to [email or web address] - password [email or web address]  And look for the night coverage person covering for me after hours  [facility] Office  [phone]

## 2017-11-25 NOTE — Progress Notes (Signed)
ANTICOAGULATION CONSULT NOTE - Follow Up Consult  Pharmacy Consult for Warfarin  Indication: atrial fibrillation  Allergies  Allergen Reactions  . Adhesive [Tape] Other (See Comments)    THE PATIENT'S SKIN IS VERY THIN AND WILL TEAR AND BRUISE VERY EASILY!!  . Sunflower Oil Itching  . Penicillins Rash    Has patient had a PCN reaction causing immediate rash, facial/tongue/throat swelling, SOB or lightheadedness with hypotension: Yes Has patient had a PCN reaction causing severe rash involving mucus membranes or skin necrosis: Unk Has patient had a PCN reaction that required hospitalization: No Has patient had a PCN reaction occurring within the last 10 years: No If all of the above answers are "NO", then may proceed with Cephalosporin use.     Patient Measurements: Height: 5\' 6"  (167.6 cm) Weight: 137 lb (62.1 kg) IBW/kg (Calculated) : 63.8   Vital Signs: Temp: 98 F (36.7 C) (02/21 0800) Temp Source: Oral (02/21 0800) BP: 146/85 (02/21 0800) Pulse Rate: 116 (02/21 0800)  Labs: Recent Labs    11/22/17 2037 11/23/17 0306 11/24/17 0321 11/25/17 0237  HGB 11.1* 9.3* 10.1* 11.0*  HCT 32.7* 27.5* 29.6* 32.0*  PLT 136* 90* 109* 128*  LABPROT 18.7*  --  21.1* 21.7*  INR 1.58  --  1.84 1.90  CREATININE 1.50* 1.27* 0.99 0.93    Estimated Creatinine Clearance: 48.2 mL/min (by C-G formula based on SCr of 0.93 mg/dL).   Medical History: Past Medical History:  Diagnosis Date  . Atrial fibrillation ([place])   . Hypertension   . Skin cancer    Assessment: 89yom admitted with urinary retention and weakness with concern for UTI. Urine cx came back negative; however, blood cx growing strep- being covered by ceftriaxone.    INR was 1.58 on admission, increased to 1.9 today. Hgb stable ~11, platelets trending upward to 128. PTA warfarin regimen is 5mg  MWF / 7.5mg  TTSS. On concurrent antibiotics - which can increase warfarin sensitivity. No signs/symptoms of bleeding noted.    Goal of Therapy:  INR 2-3 Monitor platelets by anticoagulation protocol: Yes   Plan:  Warfarin 7.5 mg x1 tonight  Daily INR  Doylene Canard, PharmD Clinical Pharmacist  Pager: [phone] Clinical Phone for 11/25/2017 until 3:30pm: [phone] If after 3:30pm, please call main pharmacy at [phone] 11/25/2017 8:25 AM

## 2017-11-25 NOTE — Progress Notes (Signed)
  Echocardiogram 2D Echocardiogram has been performed.  Peter Moreno 11/25/2017, 2:06 PM

## 2017-11-26 ENCOUNTER — Inpatient Hospital Stay (HOSPITAL_COMMUNITY): Payer: Medicare Other

## 2017-11-26 DIAGNOSIS — F0390 Unspecified dementia without behavioral disturbance: Secondary | ICD-10-CM

## 2017-11-26 DIAGNOSIS — Z880 Allergy status to penicillin: Secondary | ICD-10-CM

## 2017-11-26 DIAGNOSIS — Z87891 Personal history of nicotine dependence: Secondary | ICD-10-CM

## 2017-11-26 DIAGNOSIS — D72829 Elevated white blood cell count, unspecified: Secondary | ICD-10-CM

## 2017-11-26 DIAGNOSIS — Z7901 Long term (current) use of anticoagulants: Secondary | ICD-10-CM

## 2017-11-26 DIAGNOSIS — Z91018 Allergy to other foods: Secondary | ICD-10-CM

## 2017-11-26 DIAGNOSIS — Z91048 Other nonmedicinal substance allergy status: Secondary | ICD-10-CM

## 2017-11-26 DIAGNOSIS — R3915 Urgency of urination: Secondary | ICD-10-CM

## 2017-11-26 DIAGNOSIS — N401 Enlarged prostate with lower urinary tract symptoms: Secondary | ICD-10-CM

## 2017-11-26 DIAGNOSIS — Z818 Family history of other mental and behavioral disorders: Secondary | ICD-10-CM

## 2017-11-26 DIAGNOSIS — R39198 Other difficulties with micturition: Secondary | ICD-10-CM

## 2017-11-26 DIAGNOSIS — I10 Essential (primary) hypertension: Secondary | ICD-10-CM

## 2017-11-26 DIAGNOSIS — I4891 Unspecified atrial fibrillation: Secondary | ICD-10-CM

## 2017-11-26 DIAGNOSIS — K0889 Other specified disorders of teeth and supporting structures: Secondary | ICD-10-CM

## 2017-11-26 DIAGNOSIS — K029 Dental caries, unspecified: Secondary | ICD-10-CM

## 2017-11-26 DIAGNOSIS — I9589 Other hypotension: Secondary | ICD-10-CM | POA: Diagnosis not present

## 2017-11-26 DIAGNOSIS — R58 Hemorrhage, not elsewhere classified: Secondary | ICD-10-CM

## 2017-11-26 DIAGNOSIS — R011 Cardiac murmur, unspecified: Secondary | ICD-10-CM

## 2017-11-26 DIAGNOSIS — B954 Other streptococcus as the cause of diseases classified elsewhere: Secondary | ICD-10-CM

## 2017-11-26 DIAGNOSIS — R7881 Bacteremia: Secondary | ICD-10-CM

## 2017-11-26 DIAGNOSIS — E871 Hypo-osmolality and hyponatremia: Secondary | ICD-10-CM

## 2017-11-26 LAB — GLUCOSE, CAPILLARY
GLUCOSE-CAPILLARY: 118 mg/dL — AB (ref 65–99)
Glucose-Capillary: 119 mg/dL — ABNORMAL HIGH (ref 65–99)
Glucose-Capillary: 104 mg/dL — ABNORMAL HIGH (ref 65–99)

## 2017-11-26 LAB — CBC
HCT: 30.5 % — ABNORMAL LOW (ref 39.0–52.0)
Hemoglobin: 10.4 g/dL — ABNORMAL LOW (ref 13.0–17.0)
MCH: 31.8 pg (ref 26.0–34.0)
MCHC: 34.1 g/dL (ref 30.0–36.0)
MCV: 93.3 fL (ref 78.0–100.0)
PLATELETS: 131 10*3/uL — AB (ref 150–400)
RBC: 3.27 MIL/uL — AB (ref 4.22–5.81)
RDW: 14.5 % (ref 11.5–15.5)
WBC: 9.1 10*3/uL (ref 4.0–10.5)

## 2017-11-26 LAB — BASIC METABOLIC PANEL
Anion gap: 7 (ref 5–15)
BUN: 23 mg/dL — ABNORMAL HIGH (ref 6–20)
CALCIUM: 8.3 mg/dL — AB (ref 8.9–10.3)
CHLORIDE: 109 mmol/L (ref 101–111)
CO2: 21 mmol/L — ABNORMAL LOW (ref 22–32)
CREATININE: 0.98 mg/dL (ref 0.61–1.24)
GFR calc non Af Amer: >60 mL/min (ref >60)
Glucose, Bld: 101 mg/dL — ABNORMAL HIGH (ref 65–99)
Potassium: 3.7 mmol/L (ref 3.5–5.1)
SODIUM: 137 mmol/L (ref 135–145)

## 2017-11-26 LAB — PROTIME-INR
INR: 2.14
PROTHROMBIN TIME: 23.7 s — AB (ref 11.4–15.2)

## 2017-11-26 MED ORDER — TAMSULOSIN HCL 0.4 MG PO CAPS
0.4000 mg | ORAL_CAPSULE | Freq: Every day | ORAL | Status: DC
  Administered 2017-11-26 – 2017-12-07 (×12): 0.4 mg via ORAL
  Filled 2017-11-26 (×13): qty 1

## 2017-11-26 MED ORDER — WARFARIN SODIUM 5 MG PO TABS
5.0000 mg | ORAL_TABLET | Freq: Once | ORAL | Status: AC
  Administered 2017-11-26: 5 mg via ORAL
  Filled 2017-11-26: qty 1

## 2017-11-26 MED ORDER — DILTIAZEM HCL ER COATED BEADS 120 MG PO CP24
120.0000 mg | ORAL_CAPSULE | Freq: Every day | ORAL | Status: DC
  Administered 2017-11-26 – 2017-12-07 (×12): 120 mg via ORAL
  Filled 2017-11-26 (×13): qty 1

## 2017-11-26 NOTE — Progress Notes (Signed)
ANTICOAGULATION CONSULT NOTE - Follow Up Consult  Pharmacy Consult for Coumadin Indication: atrial fibrillation  Patient Measurements: Height: 5\' 6"  (167.6 cm) Weight: 137 lb (62.1 kg) IBW/kg (Calculated) : 63.8  Vital Signs: Temp: 98.1 F (36.7 C) (02/22 1100) Temp Source: Oral (02/22 1100) BP: 158/96 (02/22 1100) Pulse Rate: 93 (02/22 1100)  Labs: Recent Labs    11/24/17 0321 11/25/17 0237 11/26/17 0328  HGB 10.1* 11.0* 10.4*  HCT 29.6* 32.0* 30.5*  PLT 109* 128* 131*  LABPROT 21.1* 21.7* 23.7*  INR 1.84 1.90 2.14  CREATININE 0.99 0.93 0.98    Estimated Creatinine Clearance: 45.8 mL/min (by C-G formula based on SCr of 0.98 mg/dL).  Assessment:  84 yr old male continues on Coumadin as prior to admission for atrial fibrillation.  INR 1.58 on admit, but now back into therapeutic range (2.14).   Home Coumadin regimen:  5 mg MWF, 7.5 mg TTSS.  No further drop in platelet count.  Goal of Therapy:  INR 2-3 Monitor platelets by anticoagulation protocol: Yes   Plan:   Coumadin 5 mg today, usual Friday dose.  Daily PT/INR.  Arty Baumgartner, [place] Pager: [phone] 11/26/2017,12:07 PM

## 2017-11-26 NOTE — Progress Notes (Addendum)
PROGRESS NOTE    Peter Moreno  AST:[id number] DOB: 1926/12/31 DOA: 11/22/2017 PCP: Cyndy Freeze, MD   Chief Complaint  Patient presents with  . Weakness    Brief Narrative:  HPI On 11/22/2017 by Dr. Ivor Costa Peter Moreno is a 84 y.o. male with medical history significant of hypertension, atrial fibrillation on Coumadin, skin cancer, dementia, gout, who presents with difficulty urinating and urge for urination.  Per his son, patient has been having generalized weakness in the past 6 weeks. His son states that the patient has generalized weakness, but no unilateral weakness or numbness in extremities. Patient seems to have mild right facial droop, but his son states that is normal to patient due to missing teeth. In the past several days, patient has been having difficult urinating. He has urge for urination all the time. He does not seem to have dysuria per his son. Patient does not have chest pain, SOB or cough. His son states that his blood pressure has been running low in the past 6 weeks. He was seen in the ED of [place] and was started with Midodrine, without clear diagnosis. Patient does not have nausea, vomiting or diarrhea. Pt had one or 2 episodes of confusion recently, but not today. Pt was found to have urinary retention and hematuria in ED. Foley cath was placed.  Interim history Admitted for sepsis secondary to UTI.  Also found to have strep bacteremia.  Assessment & Plan   Sepsis secondary strep bacteremia -Patient presented with leukocytosis, hypotension and was febrile -leukocytosis and fever have resolved -UA: WBC TNTC, no bacteria seen, moderate leukocytes -Urine culture show no growth -Blood culture 11/22/2017: 2/2 strep viridans -blood culture from 11/24/2017 [facility] -Initially placed on aztreonam due to penicillin allergy as well as vancomycin -transitioned to ceftriaxone -Given that he has persistent bacteremia, infectious disease consulted and  appreciated -Echocardiogram shows EF 65-70%, calcified aortic valve with moderate   AS and trace AI; mild MR; biatrial enlargement; mild RVE; mild TR wiith mild pulmonary hypertension -Given that patient's age and mild dementia, will forgo obtaining TEE and continue antibiotics for 28 days from day of negative blood cultures. Discussed with patient's son, agreed to forgo TEE.  -patient will need PICC line -will obtain orthopantogram as source is currently unknown  Hypotension -resolved -Per family has been ongoing for approximately 6 weeks -Patient was recently started on midodrine -Etiology unknown -Was given dose of Solu-Cortef upon admission -BP does appear to be improved  Acute kidney injury on chronic kidney disease, stage III -resolved -Creatinine on admission 1.50, baseline approximately 1.2 -Currently 0.98 -Continue to monitor BMP  Atrial fibrillation -CHADSVASC 3 -on coumadin, INR 2.14 -restart cardizem  Urinary retention -Foley catheter placed  -will start patient on flomax  Essential hypertension -As noted above, patient has been hypotensive -lotensin held  Dementia -Continue donepezil   Hyponatremia -resolved, continue to monitor BMP  Aortic stenosis -Noted on echocardiogram in July 2017, mild   DVT Prophylaxis Coumadin  Code Status: DNR  Family Communication: None at bedside. Son via phone  Disposition Plan: Admitted, continue to monitor in stepdown. Pending sensitivities and repeat cultures.  Consultants Infectious disease  Procedures  Echocardiogram  Antibiotics   Anti-infectives (From admission, onward)   Start     Dose/Rate Route Frequency Ordered Stop   11/24/17 2200  vancomycin (VANCOCIN) IVPB 750 mg/150 ml premix  Status:  Discontinued     750 mg 150 mL/hr over 60 Minutes Intravenous Every 24 hours 11/23/17 2101 11/23/17 2105  11/24/17 2000  levofloxacin (LEVAQUIN) IVPB 750 mg  Status:  Discontinued     750 mg 100 mL/hr over 90  Minutes Intravenous Every 48 hours 11/22/17 2136 11/23/17 0038   11/24/17 1200  cefTRIAXone (ROCEPHIN) 2 g in sodium chloride 0.9 % 100 mL IVPB     2 g 200 mL/hr over 30 Minutes Intravenous Every 24 hours 11/24/17 1031     11/23/17 2200  vancomycin (VANCOCIN) IVPB 750 mg/150 ml premix  Status:  Discontinued     750 mg 150 mL/hr over 60 Minutes Intravenous Every 24 hours 11/22/17 2136 11/23/17 0038   11/23/17 2200  vancomycin (VANCOCIN) IVPB 750 mg/150 ml premix  Status:  Discontinued     750 mg 150 mL/hr over 60 Minutes Intravenous Every 24 hours 11/23/17 2105 11/24/17 1031   11/23/17 2115  vancomycin (VANCOCIN) IVPB 1000 mg/200 mL premix  Status:  Discontinued     1,000 mg 200 mL/hr over 60 Minutes Intravenous  Once 11/23/17 2101 11/23/17 2105   11/23/17 0600  aztreonam (AZACTAM) 1 g in sodium chloride 0.9 % 100 mL IVPB  Status:  Discontinued     1 g 200 mL/hr over 30 Minutes Intravenous Every 8 hours 11/22/17 2136 11/24/17 1031   11/22/17 2000  levofloxacin (LEVAQUIN) IVPB 750 mg     750 mg 100 mL/hr over 90 Minutes Intravenous  Once 11/22/17 1958 11/22/17 2315   11/22/17 2000  aztreonam (AZACTAM) 2 g in sodium chloride 0.9 % 100 mL IVPB     2 g 200 mL/hr over 30 Minutes Intravenous  Once 11/22/17 1958 11/23/17 0022   11/22/17 2000  vancomycin (VANCOCIN) IVPB 1000 mg/200 mL premix     1,000 mg 200 mL/hr over 60 Minutes Intravenous  Once 11/22/17 1958 11/23/17 0139      Subjective:   Peter Moreno seen and examined today.  Patient has no complaints today. Denies current shortness of breath, chest pain, abdominal pain, N/V/D/C.   Objective:   Vitals:   11/26/17 0402 11/26/17 0900 11/26/17 1000 11/26/17 1100  BP: 133/78 116/81 (!) 138/115 (!) 158/96  Pulse: (!) 101 98 95 93  Resp: 17 16 18 13   Temp: 98.5 F (36.9 C) 98.3 F (36.8 C)  98.1 F (36.7 C)  TempSrc: Oral   Oral  SpO2: 100% 98% 100% 100%  Weight:      Height:        Intake/Output Summary (Last 24 hours) at  11/26/2017 1338 Last data filed at 11/26/2017 0805 Gross per 24 hour  Intake 340 ml  Output 3350 ml  Net -3010 ml   Filed Weights   11/22/17 1945 11/23/17 0748  Weight: 62.1 kg (137 lb) 62.1 kg (137 lb)   Exam  General: Well developed, elderly, thin, NAD  HEENT: NCAT,mucous membranes moist.   Cardiovascular: S1 S2 auscultated, IRR, soft SEM  Respiratory: Clear to auscultation bilaterally with equal chest rise  Abdomen: Soft, nontender, nondistended, + bowel sounds  Extremities: warm dry without cyanosis clubbing or edema  Neuro: AAOx3, nonfocal  Psych: Pleasant, appropriate mood and affect  Data Reviewed: I have personally reviewed following labs and imaging studies  CBC: Recent Labs  Lab 11/22/17 2037 11/23/17 0306 11/24/17 0321 11/25/17 0237 11/26/17 0328  WBC 13.3* 10.5 7.2 8.1 9.1  NEUTROABS 12.5*  --   --   --   --   HGB 11.1* 9.3* 10.1* 11.0* 10.4*  HCT 32.7* 27.5* 29.6* 32.0* 30.5*  MCV 94.0 94.8 93.7 93.8 93.3  PLT  136* 90* 109* 128* 235*   Basic Metabolic Panel: Recent Labs  Lab 11/22/17 2037 11/23/17 0306 11/24/17 0321 11/25/17 0237 11/26/17 0328  NA 129* 134* 137 135 137  K 4.4 3.9 3.7 3.8 3.7  CL 99* 109 115* 111 109  CO2 18* 17* 16* 17* 21*  GLUCOSE 108* 135* 98 86 101*  BUN 54* 42* 37* 28* 23*  CREATININE 1.50* 1.27* 0.99 0.93 0.98  CALCIUM 8.9 7.9* 8.4* 8.2* 8.3*   GFR: Estimated Creatinine Clearance: 45.8 mL/min (by C-G formula based on SCr of 0.98 mg/dL). Liver Function Tests: Recent Labs  Lab 11/22/17 2037  AST 20  ALT 11*  ALKPHOS 53  BILITOT 0.9  PROT 5.9*  ALBUMIN 3.3*   No results for input(s): LIPASE, AMYLASE in the last 168 hours. No results for input(s): AMMONIA in the last 168 hours. Coagulation Profile: Recent Labs  Lab 11/22/17 2037 11/24/17 0321 11/25/17 0237 11/26/17 0328  INR 1.58 1.84 1.90 2.14   Cardiac Enzymes: No results for input(s): CKTOTAL, CKMB, CKMBINDEX, TROPONINI in the last 168 hours. BNP  (last 3 results) No results for input(s): PROBNP in the last 8760 hours. HbA1C: No results for input(s): HGBA1C in the last 72 hours. CBG: Recent Labs  Lab 11/25/17 1623 11/26/17 1110  GLUCAP 119* 104*   Lipid Profile: No results for input(s): CHOL, HDL, LDLCALC, TRIG, CHOLHDL, LDLDIRECT in the last 72 hours. Thyroid Function Tests: No results for input(s): TSH, T4TOTAL, FREET4, T3FREE, THYROIDAB in the last 72 hours. Anemia Panel: No results for input(s): VITAMINB12, FOLATE, FERRITIN, TIBC, IRON, RETICCTPCT in the last 72 hours. Urine analysis:    Component Value Date/Time   COLORURINE YELLOW 11/22/2017 2227   APPEARANCEUR HAZY (A) 11/22/2017 2227   LABSPEC 1.011 11/22/2017 2227   PHURINE 6.0 11/22/2017 2227   GLUCOSEU NEGATIVE 11/22/2017 2227   HGBUR LARGE (A) 11/22/2017 2227   BILIRUBINUR NEGATIVE 11/22/2017 2227   KETONESUR NEGATIVE 11/22/2017 2227   PROTEINUR NEGATIVE 11/22/2017 2227   NITRITE NEGATIVE 11/22/2017 2227   LEUKOCYTESUR MODERATE (A) 11/22/2017 2227   Sepsis Labs: @LABRCNTIP (procalcitonin:4,lacticidven:4)  ) Recent Results (from the past 240 hour(s))  Culture, blood (Routine x 2)     Status: Abnormal   Collection Time: 11/22/17  8:00 PM  Result Value Ref Range Status   Specimen Description BLOOD LEFT ANTECUBITAL  Final   Special Requests   Final    BOTTLES DRAWN AEROBIC AND ANAEROBIC Blood Culture results may not be optimal due to an excessive volume of blood received in culture bottles   Culture  Setup Time   Final    GRAM POSITIVE COCCI IN BOTH AEROBIC AND ANAEROBIC BOTTLES CRITICAL RESULT CALLED TO, READ BACK BY AND VERIFIED WITHBronwen Betters [facility] [id number] 11/23/17 A BROWNING Performed at [facility], [place] [address], [place], [place] [address]    Culture VIRIDANS STREPTOCOCCUS (A)  Final   Report Status 11/25/2017 FINAL  Final   Organism ID, Bacteria VIRIDANS STREPTOCOCCUS  Final      Susceptibility   Viridans streptococcus - MIC*     PENICILLIN <=0.06 SENSITIVE Sensitive     CEFTRIAXONE <=0.12 SENSITIVE Sensitive     ERYTHROMYCIN <=0.12 SENSITIVE Sensitive     LEVOFLOXACIN 0.5 SENSITIVE Sensitive     VANCOMYCIN 0.5 SENSITIVE Sensitive     * VIRIDANS STREPTOCOCCUS  Blood Culture ID Panel (Reflexed)     Status: Abnormal   Collection Time: 11/22/17  8:00 PM  Result Value Ref Range Status   Enterococcus  species NOT DETECTED NOT DETECTED Final   Listeria monocytogenes NOT DETECTED NOT DETECTED Final   Staphylococcus species NOT DETECTED NOT DETECTED Final   Staphylococcus aureus NOT DETECTED NOT DETECTED Final   Streptococcus species DETECTED (A) NOT DETECTED Final    Comment: Not Enterococcus species, Streptococcus agalactiae, Streptococcus pyogenes, or Streptococcus pneumoniae. CRITICAL RESULT CALLED TO, READ BACK BY AND VERIFIED WITH: Bronwen Betters PHARMD [id number] 11/23/17 A BROWNING    Streptococcus agalactiae NOT DETECTED NOT DETECTED Final   Streptococcus pneumoniae NOT DETECTED NOT DETECTED Final   Streptococcus pyogenes NOT DETECTED NOT DETECTED Final   Acinetobacter baumannii NOT DETECTED NOT DETECTED Final   Enterobacteriaceae species NOT DETECTED NOT DETECTED Final   Enterobacter cloacae complex NOT DETECTED NOT DETECTED Final   Escherichia coli NOT DETECTED NOT DETECTED Final   Klebsiella oxytoca NOT DETECTED NOT DETECTED Final   Klebsiella pneumoniae NOT DETECTED NOT DETECTED Final   Proteus species NOT DETECTED NOT DETECTED Final   Serratia marcescens NOT DETECTED NOT DETECTED Final   Haemophilus influenzae NOT DETECTED NOT DETECTED Final   Neisseria meningitidis NOT DETECTED NOT DETECTED Final   Pseudomonas aeruginosa NOT DETECTED NOT DETECTED Final   Candida albicans NOT DETECTED NOT DETECTED Final   Candida glabrata NOT DETECTED NOT DETECTED Final   Candida krusei NOT DETECTED NOT DETECTED Final   Candida parapsilosis NOT DETECTED NOT DETECTED Final   Candida tropicalis NOT DETECTED NOT DETECTED Final     Comment: Performed at [facility], [place]. [address] [place], [place] [address]  Culture, blood (Routine x 2)     Status: Abnormal   Collection Time: 11/22/17  8:02 PM  Result Value Ref Range Status   Specimen Description BLOOD RIGHT ANTECUBITAL  Final   Special Requests   Final    BOTTLES DRAWN AEROBIC AND ANAEROBIC Blood Culture adequate volume   Culture  Setup Time   Final    GRAM POSITIVE COCCI IN BOTH AEROBIC AND ANAEROBIC BOTTLES CRITICAL RESULT CALLED TO, READ BACK BY AND VERIFIED WITH: Bronwen Betters PHARMD [id number] 11/23/17 A BROWNING    Culture (A)  Final    VIRIDANS STREPTOCOCCUS SUSCEPTIBILITIES PERFORMED ON PREVIOUS CULTURE WITHIN THE LAST 5 DAYS. Performed at [facility], [place] [address] [place], [place] [address]    Report Status 11/25/2017 FINAL  Final  Urine culture     Status: None   Collection Time: 11/22/17  8:36 PM  Result Value Ref Range Status   Specimen Description URINE, CATHETERIZED  Final   Special Requests NONE  Final   Culture   Final    NO GROWTH Performed at [facility], [place] [address] [place], [place] [address]    Report Status 11/24/2017 FINAL  Final  Culture, blood (routine x 2)     Status: None (Preliminary result)   Collection Time: 11/24/17  3:47 PM  Result Value Ref Range Status   Specimen Description BLOOD LEFT ANTECUBITAL  Final   Special Requests IN PEDIATRIC BOTTLE Blood Culture adequate volume  Final   Culture   Final    NO GROWTH 1 DAY Performed at [facility], [place] [address] [place], [place] [address]    Report Status PENDING  Incomplete  Culture, blood (routine x 2)     Status: None (Preliminary result)   Collection Time: 11/24/17  3:53 PM  Result Value Ref Range Status   Specimen Description BLOOD LEFT HAND  Final   Special Requests IN PEDIATRIC BOTTLE Blood  Culture adequate volume  Final   Culture  Setup Time   Final    GRAM POSITIVE COCCI IN PEDIATRIC BOTTLE CRITICAL VALUE NOTED.  VALUE IS CONSISTENT WITH  PREVIOUSLY REPORTED AND CALLED VALUE.    Culture   Final    GRAM POSITIVE COCCI CULTURE REINCUBATED FOR BETTER GROWTH Performed at [facility], [place] [address], [place], [place] [address]    Report Status PENDING  Incomplete      Radiology Studies: No results found.   Scheduled Meds: . allopurinol  300 mg Oral Daily  . cholecalciferol  1,000 Units Oral Daily  . diltiazem  120 mg Oral Daily  . donepezil  10 mg Oral QHS  . erythromycin  1 application Both Eyes Daily  . linaclotide  290 mcg Oral QAC breakfast  . midodrine  10 mg Oral TID WC  . multivitamin with minerals  1 tablet Oral Q breakfast  . warfarin  5 mg Oral ONCE-1800  . Warfarin - Pharmacist Dosing Inpatient   Does not apply q1800   Continuous Infusions: . sodium chloride 1,000 mL (11/25/17 2049)  . cefTRIAXone (ROCEPHIN)  IV Stopped (11/26/17 1221)     LOS: 3 days   Time Spent in minutes   30 minutes  Layonna Dobie D.O. on 11/26/2017 at 1:38 PM  Between 7am to 7pm - Pager - [phone]  After 7pm go to [email or web address] - password [email or web address]  And look for the night coverage person covering for me after hours  [facility] Office  [phone]

## 2017-11-26 NOTE — Consult Note (Addendum)
Date of Admission:  11/22/2017          Reason for Consult: Streptococcus Viridans Bacteremia      Referring Provider: Dr. Cristal Ford   Assessment: Peter Moreno is a 84 y.o. male with HTN, Dementia, and Atrial fibrillation of coumadin who presented with sepsis due to Streptococcus Viridans bacteremia with unclear source at this point. Urine cultures have shown Peter Moreno growth to date. CXR did not illustrate any acute cardiopulmonary process. Blood cultures continue to be positive after 3 days of adequate antibiotic coverage. TTE did not show any signs of endocarditis. We have been consulted to evaluate for likely etiology and antibiotic selection/duration for this patient with persistent bacteremia.   At this point we would recommend continuing Ceftriaxone therapy for a total of 28 days after sterile cultures have been obtained. Peter Moreno need to pursue TEE at this point since we will treat empirically. Would also recommend getting CT head as the patient initially presented with focal right sided weakness. Repeat blood cultures.   Plan: 1. Treat as complicated Streptococcus Viridans Bacteremia with 28 days from sterile cultures IV ceftriaxone  2. Peter Moreno need to pursue TEE as we will treat for 4 weeks either way 3. Repeat blood cultures 4. Recommend CT Head as patient presented with right sided weakness  Principal Problem:   UTI (urinary tract infection) Active Problems:   Atrial fibrillation with RVR (HCC)   Essential hypertension   Long term (current) use of anticoagulants   Alzheimer's dementia   Sepsis ([place])   Hypotension   Hyponatremia  Scheduled Meds: . allopurinol  300 mg Oral Daily  . cholecalciferol  1,000 Units Oral Daily  . diltiazem  120 mg Oral Daily  . donepezil  10 mg Oral QHS  . erythromycin  1 application Both Eyes Daily  . linaclotide  290 mcg Oral QAC breakfast  . midodrine  10 mg Oral TID WC  . multivitamin with minerals  1 tablet Oral Q breakfast  . Warfarin -  Pharmacist Dosing Inpatient   Does not apply q1800   Continuous Infusions: . sodium chloride 1,000 mL (11/25/17 2049)  . cefTRIAXone (ROCEPHIN)  IV Stopped (11/25/17 1458)   PRN Meds:.acetaminophen **OR** acetaminophen, ondansetron **OR** ondansetron (ZOFRAN) IV, polyethylene glycol, zolpidem  HPI: Peter Moreno is a 84 y.o. male with HTN, Dementia, and Atrial fibrillation of coumadin who presented to the ED with acute onset right arm weakness that resolved prior to presentation. Family reports that prior to presentation the patient has had generalized weakness and hypotension of 6 weeks duration. He was evaluated at the [name] ED for his hypotension a couple weeks before presentation and his BP medications were held and he was started on Midodrine (Peter Moreno clear etiology identified at that hospitalization). In addition to these findings the patient also endorses symptoms consistent with BPH including urge and difficulty urinating.   In the ED he was found to be hypotensive and febrile. CMP and CBC were significant for hyponatremia, NAGMA, elevated creatinine, and leukocytosis. Urine analysis was obtained that showed large hemoglobin with an increase in RBCs, moderate leukocytes, Peter Moreno bacteria, squamous epithelia, and WBC clumps.   Code sepsis was initiated and the patient was started on Vancomycin, Levaquin, and Aztreonam. Blood cultures subsequently grew Strep Viridans and antibiotics were narrowed to Ceftriaxone after sensitivities returned. TTE did not illustrate any signs of endocarditis. Patient is now afebrile and his BP and leukocytosis have improved. Repeat blood cultures from 2/20 were 1 of 2 positive.  Abx: Vancomycin 2/18 -> 2/20  Aztreonam 2/18 -> 2/20  Levaquin 2/18 -> 1 dose Ceftriaxone  2/20 -> Current   Cultures: - Influenza negative  - Urine cultures with Peter Moreno growth to date  - Blood cultures on 2/18 with Strep Viridans  - Blood cultures on 2/20 with 1 of 2 bottles growing gram  positive cocci at this point  Review of Systems: Review of Systems  Constitutional: Positive for malaise/fatigue. Negative for chills, fever and weight loss.  HENT: Negative.   Eyes: Negative.   Respiratory: Negative.   Cardiovascular: Negative.   Gastrointestinal: Negative.   Genitourinary: Negative.   Musculoskeletal: Negative.   Neurological: Negative.    Past Medical History:  Diagnosis Date  . Atrial fibrillation ([place])   . Hypertension   . Skin cancer    Social History   Tobacco Use  . Smoking status: Former Smoker    Packs/day: 3.00    Types: Cigarettes    Start date: 06/22/1949    Last attempt to quit: 06/22/1969    Years since quitting: 48.4  . Smokeless tobacco: Never Used  Substance Use Topics  . Alcohol use: Yes    Comment: Occasional  . Drug use: Not on file   Family History  Problem Relation Age of Onset  . Dementia Mother   . Heart disease Unknown        Peter Moreno family history   Allergies  Allergen Reactions  . Adhesive [Tape] Other (See Comments)    THE PATIENT'S SKIN IS VERY THIN AND WILL TEAR AND BRUISE VERY EASILY!!  . Sunflower Oil Itching  . Penicillins Rash    Has patient had a PCN reaction causing immediate rash, facial/tongue/throat swelling, SOB or lightheadedness with hypotension: Yes Has patient had a PCN reaction causing severe rash involving mucus membranes or skin necrosis: Unk Has patient had a PCN reaction that required hospitalization: Peter Moreno Has patient had a PCN reaction occurring within the last 10 years: Peter Moreno If all of the above answers are "Peter Moreno", then may proceed with Cephalosporin use.    OBJECTIVE: Blood pressure 116/81, pulse 98, temperature 98.3 F (36.8 C), resp. rate 16, height 5\' 6"  (1.676 m), weight 137 lb (62.1 kg), SpO2 98 %.  Physical Exam  Constitutional: He is oriented to person, place, and time and well-developed, well-nourished, and in Peter Moreno distress.  HENT:  Head: Normocephalic and atraumatic.  Multiple miss teeth with  dental caries on the bottom  Neck: Normal range of motion. Neck supple.  Cardiovascular: Normal rate and regular rhythm.  Murmur (Systolic) heard. Pulmonary/Chest: Effort normal and breath sounds normal. He has Peter Moreno wheezes. He has Peter Moreno rales.  Abdominal: Soft. Bowel sounds are normal. There is Peter Moreno tenderness. There is Peter Moreno rebound.  Musculoskeletal: Normal range of motion. He exhibits Peter Moreno edema.  Neurological: He is alert and oriented to person, place, and time.  Skin: Skin is warm and dry.   Lab Results Lab Results  Component Value Date   WBC 9.1 11/26/2017   HGB 10.4 (L) 11/26/2017   HCT 30.5 (L) 11/26/2017   MCV 93.3 11/26/2017   PLT 131 (L) 11/26/2017    Lab Results  Component Value Date   CREATININE 0.98 11/26/2017   BUN 23 (H) 11/26/2017   NA 137 11/26/2017   K 3.7 11/26/2017   CL 109 11/26/2017   CO2 21 (L) 11/26/2017    Lab Results  Component Value Date   ALT 11 (L) 11/22/2017   AST 20 11/22/2017   ALKPHOS 53  11/22/2017   BILITOT 0.9 11/22/2017    Microbiology: Recent Results (from the past 240 hour(s))  Culture, blood (Routine x 2)     Status: Abnormal   Collection Time: 11/22/17  8:00 PM  Result Value Ref Range Status   Specimen Description BLOOD LEFT ANTECUBITAL  Final   Special Requests   Final    BOTTLES DRAWN AEROBIC AND ANAEROBIC Blood Culture results may not be optimal due to an excessive volume of blood received in culture bottles   Culture  Setup Time   Final    GRAM POSITIVE COCCI IN BOTH AEROBIC AND ANAEROBIC BOTTLES CRITICAL RESULT CALLED TO, READ BACK BY AND VERIFIED WITHBronwen Betters [facility] [id number] 11/23/17 A BROWNING Performed at [facility], [place] [address] [place], [place] [address]    Culture VIRIDANS STREPTOCOCCUS (A)  Final   Report Status 11/25/2017 FINAL  Final   Organism ID, Bacteria VIRIDANS STREPTOCOCCUS  Final      Susceptibility   Viridans streptococcus - MIC*    PENICILLIN <=0.06 SENSITIVE Sensitive     CEFTRIAXONE <=0.12 SENSITIVE  Sensitive     ERYTHROMYCIN <=0.12 SENSITIVE Sensitive     LEVOFLOXACIN 0.5 SENSITIVE Sensitive     VANCOMYCIN 0.5 SENSITIVE Sensitive     * VIRIDANS STREPTOCOCCUS  Blood Culture ID Panel (Reflexed)     Status: Abnormal   Collection Time: 11/22/17  8:00 PM  Result Value Ref Range Status   Enterococcus species NOT DETECTED NOT DETECTED Final   Listeria monocytogenes NOT DETECTED NOT DETECTED Final   Staphylococcus species NOT DETECTED NOT DETECTED Final   Staphylococcus aureus NOT DETECTED NOT DETECTED Final   Streptococcus species DETECTED (A) NOT DETECTED Final    Comment: Not Enterococcus species, Streptococcus agalactiae, Streptococcus pyogenes, or Streptococcus pneumoniae. CRITICAL RESULT CALLED TO, READ BACK BY AND VERIFIED WITH: Bronwen Betters PHARMD [id number] 11/23/17 A BROWNING    Streptococcus agalactiae NOT DETECTED NOT DETECTED Final   Streptococcus pneumoniae NOT DETECTED NOT DETECTED Final   Streptococcus pyogenes NOT DETECTED NOT DETECTED Final   Acinetobacter baumannii NOT DETECTED NOT DETECTED Final   Enterobacteriaceae species NOT DETECTED NOT DETECTED Final   Enterobacter cloacae complex NOT DETECTED NOT DETECTED Final   Escherichia coli NOT DETECTED NOT DETECTED Final   Klebsiella oxytoca NOT DETECTED NOT DETECTED Final   Klebsiella pneumoniae NOT DETECTED NOT DETECTED Final   Proteus species NOT DETECTED NOT DETECTED Final   Serratia marcescens NOT DETECTED NOT DETECTED Final   Haemophilus influenzae NOT DETECTED NOT DETECTED Final   Neisseria meningitidis NOT DETECTED NOT DETECTED Final   Pseudomonas aeruginosa NOT DETECTED NOT DETECTED Final   Candida albicans NOT DETECTED NOT DETECTED Final   Candida glabrata NOT DETECTED NOT DETECTED Final   Candida krusei NOT DETECTED NOT DETECTED Final   Candida parapsilosis NOT DETECTED NOT DETECTED Final   Candida tropicalis NOT DETECTED NOT DETECTED Final    Comment: Performed at [facility], [place] [address]  [place], [place] [address]  Culture, blood (Routine x 2)     Status: Abnormal   Collection Time: 11/22/17  8:02 PM  Result Value Ref Range Status   Specimen Description BLOOD RIGHT ANTECUBITAL  Final   Special Requests   Final    BOTTLES DRAWN AEROBIC AND ANAEROBIC Blood Culture adequate volume   Culture  Setup Time   Final    GRAM POSITIVE COCCI IN BOTH AEROBIC AND ANAEROBIC BOTTLES CRITICAL RESULT CALLED TO, READ BACK BY AND VERIFIED WITH: L  Farrel Conners [facility] 0355 11/23/17 A BROWNING    Culture (A)  Final    VIRIDANS STREPTOCOCCUS SUSCEPTIBILITIES PERFORMED ON PREVIOUS CULTURE WITHIN THE LAST 5 DAYS. Performed at [facility], [place] [address], [place], [place] [address]    Report Status 11/25/2017 FINAL  Final  Urine culture     Status: None   Collection Time: 11/22/17  8:36 PM  Result Value Ref Range Status   Specimen Description URINE, CATHETERIZED  Final   Special Requests NONE  Final   Culture   Final    Peter Moreno GROWTH Performed at [facility], [place] [address], [place], [place] [address]    Report Status 11/24/2017 FINAL  Final  Culture, blood (routine x 2)     Status: None (Preliminary result)   Collection Time: 11/24/17  3:47 PM  Result Value Ref Range Status   Specimen Description BLOOD LEFT ANTECUBITAL  Final   Special Requests IN PEDIATRIC BOTTLE Blood Culture adequate volume  Final   Culture   Final    Peter Moreno GROWTH 1 DAY Performed at [facility], [place] [address] [place], [place] [address]    Report Status PENDING  Incomplete  Culture, blood (routine x 2)     Status: None (Preliminary result)   Collection Time: 11/24/17  3:53 PM  Result Value Ref Range Status   Specimen Description BLOOD LEFT HAND  Final   Special Requests IN PEDIATRIC BOTTLE Blood Culture adequate volume  Final   Culture  Setup Time   Final    GRAM POSITIVE COCCI IN PEDIATRIC BOTTLE CRITICAL VALUE NOTED.  VALUE IS CONSISTENT WITH PREVIOUSLY REPORTED AND CALLED VALUE.    Culture   Final     GRAM POSITIVE COCCI CULTURE REINCUBATED FOR BETTER GROWTH Performed at [facility], [place] [address], [place],  [address]    Report Status PENDING  Incomplete   Ina Homes, [place] [facility] [phone] pager  11/26/2017, 10:25 AM

## 2017-11-27 ENCOUNTER — Inpatient Hospital Stay (HOSPITAL_COMMUNITY): Payer: Medicare Other

## 2017-11-27 LAB — BASIC METABOLIC PANEL
Anion gap: 6 (ref 5–15)
BUN: 19 mg/dL (ref 6–20)
CALCIUM: 8.2 mg/dL — AB (ref 8.9–10.3)
CHLORIDE: 109 mmol/L (ref 101–111)
CO2: 21 mmol/L — ABNORMAL LOW (ref 22–32)
CREATININE: 0.94 mg/dL (ref 0.61–1.24)
Glucose, Bld: 97 mg/dL (ref 65–99)
Potassium: 3.7 mmol/L (ref 3.5–5.1)
SODIUM: 136 mmol/L (ref 135–145)

## 2017-11-27 LAB — CBC
HCT: 30.5 % — ABNORMAL LOW (ref 39.0–52.0)
Hemoglobin: 10.1 g/dL — ABNORMAL LOW (ref 13.0–17.0)
MCH: 31.3 pg (ref 26.0–34.0)
MCHC: 33.1 g/dL (ref 30.0–36.0)
MCV: 94.4 fL (ref 78.0–100.0)
PLATELETS: 130 10*3/uL — AB (ref 150–400)
RBC: 3.23 MIL/uL — ABNORMAL LOW (ref 4.22–5.81)
RDW: 14.6 % (ref 11.5–15.5)
WBC: 8.3 10*3/uL (ref 4.0–10.5)

## 2017-11-27 LAB — CULTURE, BLOOD (ROUTINE X 2): SPECIAL REQUESTS: ADEQUATE

## 2017-11-27 LAB — PROTIME-INR
INR: 2.47
PROTHROMBIN TIME: 26.6 s — AB (ref 11.4–15.2)

## 2017-11-27 MED ORDER — WARFARIN SODIUM 5 MG PO TABS
5.0000 mg | ORAL_TABLET | Freq: Once | ORAL | Status: AC
  Administered 2017-11-27: 5 mg via ORAL
  Filled 2017-11-27: qty 1

## 2017-11-27 NOTE — Progress Notes (Signed)
ANTICOAGULATION CONSULT NOTE - Follow Up Consult  Pharmacy Consult for Coumadin Indication: atrial fibrillation  Patient Measurements: Height: 5\' 6"  (167.6 cm) Weight: 137 lb (62.1 kg) IBW/kg (Calculated) : 63.8  Vital Signs: Temp: 98 F (36.7 C) (02/23 1123) Temp Source: Oral (02/23 1123) BP: 127/78 (02/23 1123) Pulse Rate: 80 (02/23 1123)  Labs: Recent Labs    11/25/17 0237 11/26/17 0328 11/27/17 0224  HGB 11.0* 10.4* 10.1*  HCT 32.0* 30.5* 30.5*  PLT 128* 131* 130*  LABPROT 21.7* 23.7* 26.6*  INR 1.90 2.14 2.47  CREATININE 0.93 0.98 0.94    Estimated Creatinine Clearance: 47.7 mL/min (by C-G formula based on SCr of 0.94 mg/dL).  Assessment:  84 yr old male continues on Coumadin as prior to admission for atrial fibrillation.  INR 1.58 on admit, but now back into therapeutic range (2.47 w/ trend up).   Home Coumadin regimen:  5 mg MWF, 7.5 mg TTSS.   Goal of Therapy:  INR 2-3 Monitor platelets by anticoagulation protocol: Yes   Plan:  Coumadin 5 mg today. Daily PT/INR.  Hildred Laser, Pharm D 11/27/2017 11:43 AM

## 2017-11-27 NOTE — Progress Notes (Signed)
PROGRESS NOTE    Peter Moreno  ZOX:[id number] DOB: [birth year] DOA: 11/22/2017 PCP: Cyndy Freeze, MD   Chief Complaint  Patient presents with  . Weakness    Brief Narrative:  HPI On 11/22/2017 by Dr. Ivor Costa Peter Moreno is a 84 y.o. male with medical history significant of hypertension, atrial fibrillation on Coumadin, skin cancer, dementia, gout, who presents with difficulty urinating and urge for urination.  Per his son, patient has been having generalized weakness in the past 6 weeks. His son states that the patient has generalized weakness, but no unilateral weakness or numbness in extremities. Patient seems to have mild right facial droop, but his son states that is normal to patient due to missing teeth. In the past several days, patient has been having difficult urinating. He has urge for urination all the time. He does not seem to have dysuria per his son. Patient does not have chest pain, SOB or cough. His son states that his blood pressure has been running low in the past 6 weeks. He was seen in the ED of [place] and was started with Midodrine, without clear diagnosis. Patient does not have nausea, vomiting or diarrhea. Pt had one or 2 episodes of confusion recently, but not today. Pt was found to have urinary retention and hematuria in ED. Foley cath was placed.  Interim history Admitted and found to have sepsis secondary to strep viridans bacteremia.   Assessment & Plan   Sepsis secondary strep bacteremia -Patient presented with leukocytosis, hypotension and was febrile -leukocytosis and fever have resolved -UA: WBC TNTC, no bacteria seen, moderate leukocytes -Urine culture show no growth -Blood culture 11/22/2017: 2/2 strep viridans -blood culture from 11/24/2017 [facility] -Initially placed on aztreonam due to penicillin allergy as well as vancomycin -transitioned to ceftriaxone -Given that he has persistent bacteremia, infectious disease consulted and  appreciated -Echocardiogram shows EF 65-70%, calcified aortic valve with moderate   AS and trace AI; mild MR; biatrial enlargement; mild RVE; mild TR wiith mild pulmonary hypertension -Given that patient's age and mild dementia, will forgo obtaining TEE and continue antibiotics for 28 days from day of negative blood cultures. Discussed with patient's son, agreed to forgo TEE.  -patient will need PICC line when cultures are negative  -repeat blood cultures on 2/22 currently pending  -obtained CT head: Microvascular ischemic disease, advanced atrophy without superimposed acute intracranial process -Orthopantogram showed periapical lucency involving both main right mandibular molars and left mandibular premolar. -Will discuss with dentistry  Hypotension -resolved -Per family has been ongoing for approximately 6 weeks -Patient was recently started on midodrine -Etiology unknown -Was given dose of Solu-Cortef upon admission -BP does appear to be improved  Acute kidney injury on chronic kidney disease, stage III -resolved -Creatinine on admission 1.50, baseline approximately 1.2 -Currently 0.94 -Continue to monitor BMP  Atrial fibrillation -CHADSVASC 3 -on coumadin, INR 2.47 -Continue cardizem  Urinary retention -Foley catheter placed  -Continue flomax -will likely need outpatient urology follow up  Essential hypertension -As noted above, patient has been hypotensive -lotensin held  Dementia -Continue donepezil   Hyponatremia -resolved, continue to monitor BMP  Aortic stenosis -Noted on echocardiogram in July 2017, mild   DVT Prophylaxis Coumadin  Code Status: DNR  Family Communication: None at bedside.   Disposition Plan: Admitted, continue to monitor in stepdown. Pending sensitivities and repeat cultures.  Consultants Infectious disease  Procedures  Echocardiogram  Antibiotics   Anti-infectives (From admission, onward)   Start     Dose/Rate Route Frequency  Ordered Stop   11/24/17 2200  vancomycin (VANCOCIN) IVPB 750 mg/150 ml premix  Status:  Discontinued     750 mg 150 mL/hr over 60 Minutes Intravenous Every 24 hours 11/23/17 2101 11/23/17 2105   11/24/17 2000  levofloxacin (LEVAQUIN) IVPB 750 mg  Status:  Discontinued     750 mg 100 mL/hr over 90 Minutes Intravenous Every 48 hours 11/22/17 2136 11/23/17 0038   11/24/17 1200  cefTRIAXone (ROCEPHIN) 2 g in sodium chloride 0.9 % 100 mL IVPB     2 g 200 mL/hr over 30 Minutes Intravenous Every 24 hours 11/24/17 1031     11/23/17 2200  vancomycin (VANCOCIN) IVPB 750 mg/150 ml premix  Status:  Discontinued     750 mg 150 mL/hr over 60 Minutes Intravenous Every 24 hours 11/22/17 2136 11/23/17 0038   11/23/17 2200  vancomycin (VANCOCIN) IVPB 750 mg/150 ml premix  Status:  Discontinued     750 mg 150 mL/hr over 60 Minutes Intravenous Every 24 hours 11/23/17 2105 11/24/17 1031   11/23/17 2115  vancomycin (VANCOCIN) IVPB 1000 mg/200 mL premix  Status:  Discontinued     1,000 mg 200 mL/hr over 60 Minutes Intravenous  Once 11/23/17 2101 11/23/17 2105   11/23/17 0600  aztreonam (AZACTAM) 1 g in sodium chloride 0.9 % 100 mL IVPB  Status:  Discontinued     1 g 200 mL/hr over 30 Minutes Intravenous Every 8 hours 11/22/17 2136 11/24/17 1031   11/22/17 2000  levofloxacin (LEVAQUIN) IVPB 750 mg     750 mg 100 mL/hr over 90 Minutes Intravenous  Once 11/22/17 1958 11/22/17 2315   11/22/17 2000  aztreonam (AZACTAM) 2 g in sodium chloride 0.9 % 100 mL IVPB     2 g 200 mL/hr over 30 Minutes Intravenous  Once 11/22/17 1958 11/23/17 0022   11/22/17 2000  vancomycin (VANCOCIN) IVPB 1000 mg/200 mL premix     1,000 mg 200 mL/hr over 60 Minutes Intravenous  Once 11/22/17 1958 11/23/17 0139      Subjective:   [name] seen and examined today.  Patient denies current chest pain, shortness of breath, abdominal pain, nausea vomiting, diarrhea or constipation.  Inquires about when he will go home.   Objective:    Vitals:   11/27/17 0442 11/27/17 0700 11/27/17 0800 11/27/17 1123  BP: 114/80 (!) 138/96 120/71 127/78  Pulse: 67   80  Resp: 11 17 16 15   Temp: 98 F (36.7 C)   98 F (36.7 C)  TempSrc: Axillary   Oral  SpO2: 97%   100%  Weight:      Height:        Intake/Output Summary (Last 24 hours) at 11/27/2017 1340 Last data filed at 11/27/2017 1125 Gross per 24 hour  Intake 1405 ml  Output 2076 ml  Net -671 ml   Filed Weights   11/22/17 1945 11/23/17 0748  Weight: 62.1 kg (137 lb) 62.1 kg (137 lb)   Exam  General: Well developed, well nourished, NAD, appears stated age  46: NCAT, mucous membranes moist.   Neck: Supple  Cardiovascular: S1 S2 auscultated, irregular, +SEM  Respiratory: Clear to auscultation bilaterally with equal chest rise  Abdomen: Soft, nontender, nondistended, + bowel sounds  Extremities: warm dry without cyanosis clubbing or edema  Neuro: AAOx3, nonfocal   Psych: appropriate mood and affect, pleasant   Data Reviewed: I have personally reviewed following labs and imaging studies  CBC: Recent Labs  Lab 11/22/17 2037 11/23/17 0306 11/24/17 0321  11/25/17 0237 11/26/17 0328 11/27/17 0224  WBC 13.3* 10.5 7.2 8.1 9.1 8.3  NEUTROABS 12.5*  --   --   --   --   --   HGB 11.1* 9.3* 10.1* 11.0* 10.4* 10.1*  HCT 32.7* 27.5* 29.6* 32.0* 30.5* 30.5*  MCV 94.0 94.8 93.7 93.8 93.3 94.4  PLT 136* 90* 109* 128* 131* 761*   Basic Metabolic Panel: Recent Labs  Lab 11/23/17 0306 11/24/17 0321 11/25/17 0237 11/26/17 0328 11/27/17 0224  NA 134* 137 135 137 136  K 3.9 3.7 3.8 3.7 3.7  CL 109 115* 111 109 109  CO2 17* 16* 17* 21* 21*  GLUCOSE 135* 98 86 101* 97  BUN 42* 37* 28* 23* 19  CREATININE 1.27* 0.99 0.93 0.98 0.94  CALCIUM 7.9* 8.4* 8.2* 8.3* 8.2*   GFR: Estimated Creatinine Clearance: 47.7 mL/min (by C-G formula based on SCr of 0.94 mg/dL). Liver Function Tests: Recent Labs  Lab 11/22/17 2037  AST 20  ALT 11*  ALKPHOS 53  BILITOT  0.9  PROT 5.9*  ALBUMIN 3.3*   No results for input(s): LIPASE, AMYLASE in the last 168 hours. No results for input(s): AMMONIA in the last 168 hours. Coagulation Profile: Recent Labs  Lab 11/22/17 2037 11/24/17 0321 11/25/17 0237 11/26/17 0328 11/27/17 0224  INR 1.58 1.84 1.90 2.14 2.47   Cardiac Enzymes: No results for input(s): CKTOTAL, CKMB, CKMBINDEX, TROPONINI in the last 168 hours. BNP (last 3 results) No results for input(s): PROBNP in the last 8760 hours. HbA1C: No results for input(s): HGBA1C in the last 72 hours. CBG: Recent Labs  Lab 11/25/17 1623 11/26/17 1110 11/26/17 1611  GLUCAP 119* 104* 118*   Lipid Profile: No results for input(s): CHOL, HDL, LDLCALC, TRIG, CHOLHDL, LDLDIRECT in the last 72 hours. Thyroid Function Tests: No results for input(s): TSH, T4TOTAL, FREET4, T3FREE, THYROIDAB in the last 72 hours. Anemia Panel: No results for input(s): VITAMINB12, FOLATE, FERRITIN, TIBC, IRON, RETICCTPCT in the last 72 hours. Urine analysis:    Component Value Date/Time   COLORURINE YELLOW 11/22/2017 2227   APPEARANCEUR HAZY (A) 11/22/2017 2227   LABSPEC 1.011 11/22/2017 2227   PHURINE 6.0 11/22/2017 2227   GLUCOSEU NEGATIVE 11/22/2017 2227   HGBUR LARGE (A) 11/22/2017 2227   BILIRUBINUR NEGATIVE 11/22/2017 2227   KETONESUR NEGATIVE 11/22/2017 2227   PROTEINUR NEGATIVE 11/22/2017 2227   NITRITE NEGATIVE 11/22/2017 2227   LEUKOCYTESUR MODERATE (A) 11/22/2017 2227   Sepsis Labs: @LABRCNTIP (procalcitonin:4,lacticidven:4)  ) Recent Results (from the past 240 hour(s))  Culture, blood (Routine x 2)     Status: Abnormal   Collection Time: 11/22/17  8:00 PM  Result Value Ref Range Status   Specimen Description BLOOD LEFT ANTECUBITAL  Final   Special Requests   Final    BOTTLES DRAWN AEROBIC AND ANAEROBIC Blood Culture results may not be optimal due to an excessive volume of blood received in culture bottles   Culture  Setup Time   Final    GRAM  POSITIVE COCCI IN BOTH AEROBIC AND ANAEROBIC BOTTLES CRITICAL RESULT CALLED TO, READ BACK BY AND VERIFIED WITHBronwen Betters [facility] [id number] 11/23/17 A BROWNING Performed at [facility], [place] [address], [place], [place] [address]    Culture VIRIDANS STREPTOCOCCUS (A)  Final   Report Status 11/25/2017 FINAL  Final   Organism ID, Bacteria VIRIDANS STREPTOCOCCUS  Final      Susceptibility   Viridans streptococcus - MIC*    PENICILLIN <=0.06 SENSITIVE Sensitive  CEFTRIAXONE <=0.12 SENSITIVE Sensitive     ERYTHROMYCIN <=0.12 SENSITIVE Sensitive     LEVOFLOXACIN 0.5 SENSITIVE Sensitive     VANCOMYCIN 0.5 SENSITIVE Sensitive     * VIRIDANS STREPTOCOCCUS  Blood Culture ID Panel (Reflexed)     Status: Abnormal   Collection Time: 11/22/17  8:00 PM  Result Value Ref Range Status   Enterococcus species NOT DETECTED NOT DETECTED Final   Listeria monocytogenes NOT DETECTED NOT DETECTED Final   Staphylococcus species NOT DETECTED NOT DETECTED Final   Staphylococcus aureus NOT DETECTED NOT DETECTED Final   Streptococcus species DETECTED (A) NOT DETECTED Final    Comment: Not Enterococcus species, Streptococcus agalactiae, Streptococcus pyogenes, or Streptococcus pneumoniae. CRITICAL RESULT CALLED TO, READ BACK BY AND VERIFIED WITH: Bronwen Betters PHARMD [id number] 11/23/17 A BROWNING    Streptococcus agalactiae NOT DETECTED NOT DETECTED Final   Streptococcus pneumoniae NOT DETECTED NOT DETECTED Final   Streptococcus pyogenes NOT DETECTED NOT DETECTED Final   Acinetobacter baumannii NOT DETECTED NOT DETECTED Final   Enterobacteriaceae species NOT DETECTED NOT DETECTED Final   Enterobacter cloacae complex NOT DETECTED NOT DETECTED Final   Escherichia coli NOT DETECTED NOT DETECTED Final   Klebsiella oxytoca NOT DETECTED NOT DETECTED Final   Klebsiella pneumoniae NOT DETECTED NOT DETECTED Final   Proteus species NOT DETECTED NOT DETECTED Final   Serratia marcescens NOT DETECTED NOT DETECTED Final    Haemophilus influenzae NOT DETECTED NOT DETECTED Final   Neisseria meningitidis NOT DETECTED NOT DETECTED Final   Pseudomonas aeruginosa NOT DETECTED NOT DETECTED Final   Candida albicans NOT DETECTED NOT DETECTED Final   Candida glabrata NOT DETECTED NOT DETECTED Final   Candida krusei NOT DETECTED NOT DETECTED Final   Candida parapsilosis NOT DETECTED NOT DETECTED Final   Candida tropicalis NOT DETECTED NOT DETECTED Final    Comment: Performed at [facility], [place]. [address] [place], [place] [address]  Culture, blood (Routine x 2)     Status: Abnormal   Collection Time: 11/22/17  8:02 PM  Result Value Ref Range Status   Specimen Description BLOOD RIGHT ANTECUBITAL  Final   Special Requests   Final    BOTTLES DRAWN AEROBIC AND ANAEROBIC Blood Culture adequate volume   Culture  Setup Time   Final    GRAM POSITIVE COCCI IN BOTH AEROBIC AND ANAEROBIC BOTTLES CRITICAL RESULT CALLED TO, READ BACK BY AND VERIFIED WITH: Bronwen Betters PHARMD [id number] 11/23/17 A BROWNING    Culture (A)  Final    VIRIDANS STREPTOCOCCUS SUSCEPTIBILITIES PERFORMED ON PREVIOUS CULTURE WITHIN THE LAST 5 DAYS. Performed at [facility], [place] [address], [place], [place] [address]    Report Status 11/25/2017 FINAL  Final  Urine culture     Status: None   Collection Time: 11/22/17  8:36 PM  Result Value Ref Range Status   Specimen Description URINE, CATHETERIZED  Final   Special Requests NONE  Final   Culture   Final    NO GROWTH Performed at [facility], [place] [address] [place], [place] [address]    Report Status 11/24/2017 FINAL  Final  Culture, blood (routine x 2)     Status: None (Preliminary result)   Collection Time: 11/24/17  3:47 PM  Result Value Ref Range Status   Specimen Description BLOOD LEFT ANTECUBITAL  Final   Special Requests IN PEDIATRIC BOTTLE Blood Culture adequate volume  Final   Culture   Final    NO GROWTH 2 DAYS Performed at  [facility], [place] [address],  [place], [place] [address]    Report Status PENDING  Incomplete  Culture, blood (routine x 2)     Status: None (Preliminary result)   Collection Time: 11/24/17  3:53 PM  Result Value Ref Range Status   Specimen Description BLOOD LEFT HAND  Final   Special Requests IN PEDIATRIC BOTTLE Blood Culture adequate volume  Final   Culture  Setup Time   Final    GRAM POSITIVE COCCI IN PEDIATRIC BOTTLE CRITICAL VALUE NOTED.  VALUE IS CONSISTENT WITH PREVIOUSLY REPORTED AND CALLED VALUE.    Culture   Final    GRAM POSITIVE COCCI CULTURE REINCUBATED FOR BETTER GROWTH Performed at [facility], [place] [address] [place], [place] [address]    Report Status PENDING  Incomplete      Radiology Studies: Dg Orthopantogram  Result Date: 11/26/2017 CLINICAL DATA:  Bacteremia. EXAM: ORTHOPANTOGRAM/PANORAMIC COMPARISON:  None. FINDINGS: Periapical lucency is seen involving a left mandibular premolar and remaining right mandibular molars. IMPRESSION: Periapical lucency involving both remain right mandibular molars and left mandibular premolar. Consider dental referral and dedicated dental radiographs for further evaluation. Electronically Signed   By: Earle Gell M.D.   On: 11/26/2017 19:44   Ct Head Wo Contrast  Result Date: 11/27/2017 CLINICAL DATA:  Bacteremia.  Generalized weakness.  Confusion. EXAM: CT HEAD WITHOUT CONTRAST TECHNIQUE: Contiguous axial images were obtained from the base of the skull through the vertex without intravenous contrast. COMPARISON:  10/31/2017 FINDINGS: Brain: Similar findings of advanced atrophy with sulcal prominence and prominence of the bifrontal extra-axial spaces. Minimal periventricular hypodensities compatible microvascular ischemic disease. No CT evidence of acute large territory infarct. No intraparenchymal or extra-axial mass or hemorrhage. Unchanged size and configuration of the ventricles and the basilar cisterns. No midline shift. Vascular: Intracranial  atherosclerosis. Skull: No displaced calvarial fracture. Chronic leftward deviation of the nasal bones. Sinuses/Orbits: Paranasal sinuses and mastoid air cells are normally aerated. No air-fluid levels. Debris is seen within the bilateral external auditory canals. Post bilateral cataract surgery. Other: Regional soft tissues appear normal. IMPRESSION: Similar findings of microvascular ischemic disease and markedly advanced atrophy without superimposed acute intracranial process. Electronically Signed   By: Sandi Mariscal M.D.   On: 11/27/2017 09:23     Scheduled Meds: . allopurinol  300 mg Oral Daily  . cholecalciferol  1,000 Units Oral Daily  . diltiazem  120 mg Oral Daily  . donepezil  10 mg Oral QHS  . erythromycin  1 application Both Eyes Daily  . linaclotide  290 mcg Oral QAC breakfast  . midodrine  10 mg Oral TID WC  . multivitamin with minerals  1 tablet Oral Q breakfast  . tamsulosin  0.4 mg Oral Daily  . warfarin  5 mg Oral ONCE-1800  . Warfarin - Pharmacist Dosing Inpatient   Does not apply q1800   Continuous Infusions: . sodium chloride 1,000 mL (11/25/17 2049)  . cefTRIAXone (ROCEPHIN)  IV 2 g (11/27/17 1300)     LOS: 4 days   Time Spent in minutes   30 minutes  Glenford Garis D.O. on 11/27/2017 at 1:40 PM  Between 7am to 7pm - Pager - [phone]  After 7pm go to [email or web address] - password [email or web address]  And look for the night coverage person covering for me after hours  [facility] Office  [phone]

## 2017-11-28 ENCOUNTER — Inpatient Hospital Stay (HOSPITAL_COMMUNITY): Payer: Medicare Other

## 2017-11-28 LAB — BASIC METABOLIC PANEL
Anion gap: 8 (ref 5–15)
BUN: 22 mg/dL — AB (ref 6–20)
CALCIUM: 8.4 mg/dL — AB (ref 8.9–10.3)
CO2: 21 mmol/L — ABNORMAL LOW (ref 22–32)
CREATININE: 0.94 mg/dL (ref 0.61–1.24)
Chloride: 109 mmol/L (ref 101–111)
Glucose, Bld: 99 mg/dL (ref 65–99)
Potassium: 3.9 mmol/L (ref 3.5–5.1)
Sodium: 138 mmol/L (ref 135–145)

## 2017-11-28 LAB — CBC
HCT: 31.7 % — ABNORMAL LOW (ref 39.0–52.0)
Hemoglobin: 10.8 g/dL — ABNORMAL LOW (ref 13.0–17.0)
MCH: 32.2 pg (ref 26.0–34.0)
MCHC: 34.1 g/dL (ref 30.0–36.0)
MCV: 94.6 fL (ref 78.0–100.0)
PLATELETS: 139 10*3/uL — AB (ref 150–400)
RBC: 3.35 MIL/uL — AB (ref 4.22–5.81)
RDW: 14.8 % (ref 11.5–15.5)
WBC: 8.3 10*3/uL (ref 4.0–10.5)

## 2017-11-28 LAB — PROTIME-INR
INR: 2.6
Prothrombin Time: 27.7 seconds — ABNORMAL HIGH (ref 11.4–15.2)

## 2017-11-28 MED ORDER — SODIUM CHLORIDE 0.9% FLUSH
10.0000 mL | Freq: Two times a day (BID) | INTRAVENOUS | Status: DC
Start: 2017-11-28 — End: 2017-12-08
  Administered 2017-11-28 – 2017-12-07 (×8): 10 mL

## 2017-11-28 MED ORDER — SODIUM CHLORIDE 0.9% FLUSH
10.0000 mL | INTRAVENOUS | Status: DC | PRN
  Administered 2017-12-01: 10 mL
  Administered 2017-12-03: 20 mL
  Administered 2017-12-07: 10 mL
  Filled 2017-11-28 (×3): qty 40

## 2017-11-28 MED ORDER — WARFARIN SODIUM 5 MG PO TABS
5.0000 mg | ORAL_TABLET | Freq: Once | ORAL | Status: AC
  Administered 2017-11-28: 5 mg via ORAL
  Filled 2017-11-28: qty 1

## 2017-11-28 MED ORDER — SODIUM CHLORIDE 0.9% FLUSH: 10.0000 mL | INTRAVENOUS | Status: DC | PRN

## 2017-11-28 NOTE — Progress Notes (Signed)
Attempted to call son two more times to obtain PICC consent.  No answer.  Jarrett Soho RN notified and aware to obtain consent if son calls.

## 2017-11-28 NOTE — Progress Notes (Signed)
Peripherally Inserted Central Catheter/Midline Placement  The IV Nurse has discussed with the patient and/or persons authorized to consent for the patient, the purpose of this procedure and the potential benefits and risks involved with this procedure.  The benefits include less needle sticks, lab draws from the catheter, and the patient may be discharged home with the catheter. Risks include, but not limited to, infection, bleeding, blood clot (thrombus formation), and puncture of an artery; nerve damage and irregular heartbeat and possibility to perform a PICC exchange if needed/ordered by physician.  Alternatives to this procedure were also discussed.  Bard Power PICC patient education guide, fact sheet on infection prevention and patient information card has been provided to patient /or left at bedside.  Son gave telephone consent.  Pt gave consent at bedside.  PICC/Midline Placement Documentation  PICC Single Lumen 11/28/17 PICC Right Brachial 38 cm 0 cm (Active)  Indication for Insertion or Continuance of Line Home intravenous therapies (PICC only) 11/28/2017  5:00 PM  Exposed Catheter (cm) 0 cm 11/28/2017  5:00 PM  Site Assessment Clean;Dry;Intact 11/28/2017  5:00 PM  Line Status Flushed;Blood return noted;Saline locked 11/28/2017  5:00 PM  Dressing Type Transparent 11/28/2017  5:00 PM  Dressing Status Clean;Intact;Dry 11/28/2017  5:00 PM  Line Care Connections checked and tightened 11/28/2017  5:00 PM  Line Adjustment (NICU/IV Team Only) No 11/28/2017  5:00 PM  Dressing Intervention New dressing 11/28/2017  5:00 PM  Dressing Change Due 12/05/17 11/28/2017  5:00 PM       Rolena Infante 11/28/2017, 5:39 PM

## 2017-11-28 NOTE — Progress Notes (Signed)
ID PROGRESS NOTE  25VK M with complicated streptococcal bacteremia, presumed endocarditis. Pantogram showing source of bacteremia could be due to abscess tooth.  Agree with reaching out to dentistry for tooth extraction.  Blood cx ngtd at 48hr. Can place picc line  Continue with plan to treat for 28d using 2/22 as day 1 with ceftriaxone 2gm IV daily. Will need weekly cbc, bmp. Can pull picc line at completion of abtx.  Will see back in the ID clinic in 3-4 wk.  Elzie Rings [facility] [phone]

## 2017-11-28 NOTE — Progress Notes (Signed)
Son returned phone call.  PICC consent obtained.

## 2017-11-28 NOTE — Progress Notes (Signed)
PHARMACY CONSULT NOTE FOR:  OUTPATIENT  PARENTERAL ANTIBIOTIC THERAPY (OPAT)  Indication: strep bacteremia Regimen: rocephin 2gm IV q24h End date: 12/22/2017  IV antibiotic discharge orders are pended. To discharging provider:  please sign these orders via discharge navigator,  Select New Orders & click on the button choice - Manage This Unsigned Work.     Thank you for allowing pharmacy to be a part of this patient's care.  Hildred Laser, Pharm D 11/28/2017 12:15 PM

## 2017-11-28 NOTE — Progress Notes (Signed)
ANTICOAGULATION CONSULT NOTE - Follow Up Consult  Pharmacy Consult for Coumadin Indication: atrial fibrillation  Patient Measurements: Height: 5\' 6"  (167.6 cm) Weight: 137 lb (62.1 kg) IBW/kg (Calculated) : 63.8  Vital Signs: Temp: 97.5 F (36.4 C) (02/24 0417) Temp Source: Oral (02/24 0417) BP: 113/66 (02/24 0813) Pulse Rate: 82 (02/24 0417)  Labs: Recent Labs    11/26/17 0328 11/27/17 0224 11/28/17 0219  HGB 10.4* 10.1* 10.8*  HCT 30.5* 30.5* 31.7*  PLT 131* 130* 139*  LABPROT 23.7* 26.6* 27.7*  INR 2.14 2.47 2.60  CREATININE 0.98 0.94 0.94    Estimated Creatinine Clearance: 47.7 mL/min (by C-G formula based on SCr of 0.94 mg/dL).  Assessment:  84 yr old male continues on Coumadin as prior to admission for atrial fibrillation.  INR 1.58 on admit, but now back into therapeutic range (2.6 w/ trend up).   Home Coumadin regimen:  5 mg MWF, 7.5 mg TTSS.   Goal of Therapy:  INR 2-3 Monitor platelets by anticoagulation protocol: Yes   Plan:  Coumadin 5 mg today. Daily PT/INR.  Hildred Laser, Pharm D 11/28/2017 12:13 PM

## 2017-11-28 NOTE — Progress Notes (Signed)
PROGRESS NOTE    Peter Moreno  TDD:[id number] DOB: 06-12-1927 DOA: 11/22/2017 PCP: Cyndy Freeze, MD   Chief Complaint  Patient presents with  . Weakness    Brief Narrative:  HPI On 11/22/2017 by Dr. Ivor Costa Peter Moreno is a 84 y.o. male with medical history significant of hypertension, atrial fibrillation on Coumadin, skin cancer, dementia, gout, who presents with difficulty urinating and urge for urination.  Per his son, patient has been having generalized weakness in the past 6 weeks. His son states that the patient has generalized weakness, but no unilateral weakness or numbness in extremities. Patient seems to have mild right facial droop, but his son states that is normal to patient due to missing teeth. In the past several days, patient has been having difficult urinating. He has urge for urination all the time. He does not seem to have dysuria per his son. Patient does not have chest pain, SOB or cough. His son states that his blood pressure has been running low in the past 6 weeks. He was seen in the ED of [place] and was started with Midodrine, without clear diagnosis. Patient does not have nausea, vomiting or diarrhea. Pt had one or 2 episodes of confusion recently, but not today. Pt was found to have urinary retention and hematuria in ED. Foley cath was placed.  Interim history Admitted and found to have sepsis secondary to strep viridans bacteremia.   Assessment & Plan   Sepsis secondary strep bacteremia -Patient presented with leukocytosis, hypotension and was febrile -leukocytosis and fever have resolved -UA: WBC TNTC, no bacteria seen, moderate leukocytes -Urine culture show no growth -Blood culture 11/22/2017: 2/2 strep viridans -blood culture from 11/24/2017 [facility] -Initially placed on aztreonam due to penicillin allergy as well as vancomycin -transitioned to ceftriaxone -Given that he has persistent bacteremia, infectious disease consulted and  appreciated -Echocardiogram shows EF 65-70%, calcified aortic valve with moderate   AS and trace AI; mild MR; biatrial enlargement; mild RVE; mild TR wiith mild pulmonary hypertension -Given that patient's age and mild dementia, will forgo obtaining TEE and continue antibiotics for 28 days from day of negative blood cultures. Discussed with patient's son, agreed to forgo TEE.  -repeat blood cultures on 2/22 and show no growth to date -obtained CT head: Microvascular ischemic disease, advanced atrophy without superimposed acute intracranial process -Orthopantogram showed periapical lucency involving both main right mandibular molars and left mandibular premolar. -will discuss with dentistry on 11/29/17 -given negative cultures, will order PICC line  Hypotension -resolved -Per family has been ongoing for approximately 6 weeks -Patient was recently started on midodrine -Etiology unknown -Was given dose of Solu-Cortef upon admission -BP does appear to be improved  Acute kidney injury on chronic kidney disease, stage III -resolved -Creatinine on admission 1.50, baseline approximately 1.2 -Currently 0.94 -Continue to monitor BMP  Atrial fibrillation -CHADSVASC 3 -on coumadin, INR 2.60 -Continue cardizem  Urinary retention -Foley catheter placed  -Continue flomax -will likely need outpatient urology follow up  Essential hypertension -As noted above, patient has been hypotensive -lotensin held  Dementia -Continue donepezil   Hyponatremia -resolved, continue to monitor BMP  Aortic stenosis -Noted on echocardiogram in July 2017, mild   DVT Prophylaxis Coumadin  Code Status: DNR  Family Communication: None at bedside.   Disposition Plan: Admitted, continue to monitor in stepdown. Will order PICC   Consultants Infectious disease  Procedures  Echocardiogram  Antibiotics   Anti-infectives (From admission, onward)   Start     Dose/Rate Route  Frequency Ordered Stop    11/24/17 2200  vancomycin (VANCOCIN) IVPB 750 mg/150 ml premix  Status:  Discontinued     750 mg 150 mL/hr over 60 Minutes Intravenous Every 24 hours 11/23/17 2101 11/23/17 2105   11/24/17 2000  levofloxacin (LEVAQUIN) IVPB 750 mg  Status:  Discontinued     750 mg 100 mL/hr over 90 Minutes Intravenous Every 48 hours 11/22/17 2136 11/23/17 0038   11/24/17 1200  cefTRIAXone (ROCEPHIN) 2 g in sodium chloride 0.9 % 100 mL IVPB     2 g 200 mL/hr over 30 Minutes Intravenous Every 24 hours 11/24/17 1031     11/23/17 2200  vancomycin (VANCOCIN) IVPB 750 mg/150 ml premix  Status:  Discontinued     750 mg 150 mL/hr over 60 Minutes Intravenous Every 24 hours 11/22/17 2136 11/23/17 0038   11/23/17 2200  vancomycin (VANCOCIN) IVPB 750 mg/150 ml premix  Status:  Discontinued     750 mg 150 mL/hr over 60 Minutes Intravenous Every 24 hours 11/23/17 2105 11/24/17 1031   11/23/17 2115  vancomycin (VANCOCIN) IVPB 1000 mg/200 mL premix  Status:  Discontinued     1,000 mg 200 mL/hr over 60 Minutes Intravenous  Once 11/23/17 2101 11/23/17 2105   11/23/17 0600  aztreonam (AZACTAM) 1 g in sodium chloride 0.9 % 100 mL IVPB  Status:  Discontinued     1 g 200 mL/hr over 30 Minutes Intravenous Every 8 hours 11/22/17 2136 11/24/17 1031   11/22/17 2000  levofloxacin (LEVAQUIN) IVPB 750 mg     750 mg 100 mL/hr over 90 Minutes Intravenous  Once 11/22/17 1958 11/22/17 2315   11/22/17 2000  aztreonam (AZACTAM) 2 g in sodium chloride 0.9 % 100 mL IVPB     2 g 200 mL/hr over 30 Minutes Intravenous  Once 11/22/17 1958 11/23/17 0022   11/22/17 2000  vancomycin (VANCOCIN) IVPB 1000 mg/200 mL premix     1,000 mg 200 mL/hr over 60 Minutes Intravenous  Once 11/22/17 1958 11/23/17 0139      Subjective:   [name] seen and examined today. Denies current chest pain, shortness of breath, abdominal pain, N/V/D/C, dizziness, headache.   Objective:   Vitals:   11/27/17 1500 11/27/17 1919 11/28/17 0417 11/28/17 0813  BP:  126/87 122/85 (!) 117/97 113/66  Pulse: 82 96 82   Resp: 13 15 15    Temp:  98.6 F (37 C) (!) 97.5 F (36.4 C)   TempSrc:  Oral Oral   SpO2: 100% 100% 96%   Weight:      Height:        Intake/Output Summary (Last 24 hours) at 11/28/2017 1305 Last data filed at 11/28/2017 9371 Gross per 24 hour  Intake 2230 ml  Output 1150 ml  Net 1080 ml   Filed Weights   11/22/17 1945 11/23/17 0748  Weight: 62.1 kg (137 lb) 62.1 kg (137 lb)   Exam  General: Well developed, well nourished, NAD, appears stated age  57: NCAT, mucous membranes moist.   Neck: Supple  Cardiovascular: S1 S2 auscultated, irregular, 2/6 SEM  Respiratory: Clear to auscultation bilaterally with equal chest rise  Abdomen: Soft, nontender, nondistended, + bowel sounds  Extremities: warm dry without cyanosis clubbing or edema  Neuro: AAOx3, nonfocal  Psych: pleasant, appropriate mood and affect  Data Reviewed: I have personally reviewed following labs and imaging studies  CBC: Recent Labs  Lab 11/22/17 2037  11/24/17 0321 11/25/17 0237 11/26/17 0328 11/27/17 0224 11/28/17 0219  WBC 13.3*   < >  7.2 8.1 9.1 8.3 8.3  NEUTROABS 12.5*  --   --   --   --   --   --   HGB 11.1*   < > 10.1* 11.0* 10.4* 10.1* 10.8*  HCT 32.7*   < > 29.6* 32.0* 30.5* 30.5* 31.7*  MCV 94.0   < > 93.7 93.8 93.3 94.4 94.6  PLT 136*   < > 109* 128* 131* 130* 139*   < > = values in this interval not displayed.   Basic Metabolic Panel: Recent Labs  Lab 11/24/17 0321 11/25/17 0237 11/26/17 0328 11/27/17 0224 11/28/17 0219  NA 137 135 137 136 138  K 3.7 3.8 3.7 3.7 3.9  CL 115* 111 109 109 109  CO2 16* 17* 21* 21* 21*  GLUCOSE 98 86 101* 97 99  BUN 37* 28* 23* 19 22*  CREATININE 0.99 0.93 0.98 0.94 0.94  CALCIUM 8.4* 8.2* 8.3* 8.2* 8.4*   GFR: Estimated Creatinine Clearance: 47.7 mL/min (by C-G formula based on SCr of 0.94 mg/dL). Liver Function Tests: Recent Labs  Lab 11/22/17 2037  AST 20  ALT 11*  ALKPHOS 53   BILITOT 0.9  PROT 5.9*  ALBUMIN 3.3*   No results for input(s): LIPASE, AMYLASE in the last 168 hours. No results for input(s): AMMONIA in the last 168 hours. Coagulation Profile: Recent Labs  Lab 11/24/17 0321 11/25/17 0237 11/26/17 0328 11/27/17 0224 11/28/17 0219  INR 1.84 1.90 2.14 2.47 2.60   Cardiac Enzymes: No results for input(s): CKTOTAL, CKMB, CKMBINDEX, TROPONINI in the last 168 hours. BNP (last 3 results) No results for input(s): PROBNP in the last 8760 hours. HbA1C: No results for input(s): HGBA1C in the last 72 hours. CBG: Recent Labs  Lab 11/25/17 1623 11/26/17 1110 11/26/17 1611  GLUCAP 119* 104* 118*   Lipid Profile: No results for input(s): CHOL, HDL, LDLCALC, TRIG, CHOLHDL, LDLDIRECT in the last 72 hours. Thyroid Function Tests: No results for input(s): TSH, T4TOTAL, FREET4, T3FREE, THYROIDAB in the last 72 hours. Anemia Panel: No results for input(s): VITAMINB12, FOLATE, FERRITIN, TIBC, IRON, RETICCTPCT in the last 72 hours. Urine analysis:    Component Value Date/Time   COLORURINE YELLOW 11/22/2017 2227   APPEARANCEUR HAZY (A) 11/22/2017 2227   LABSPEC 1.011 11/22/2017 2227   PHURINE 6.0 11/22/2017 2227   GLUCOSEU NEGATIVE 11/22/2017 2227   HGBUR LARGE (A) 11/22/2017 2227   BILIRUBINUR NEGATIVE 11/22/2017 2227   KETONESUR NEGATIVE 11/22/2017 2227   PROTEINUR NEGATIVE 11/22/2017 2227   NITRITE NEGATIVE 11/22/2017 2227   LEUKOCYTESUR MODERATE (A) 11/22/2017 2227   Sepsis Labs: @LABRCNTIP (procalcitonin:4,lacticidven:4)  ) Recent Results (from the past 240 hour(s))  Culture, blood (Routine x 2)     Status: Abnormal   Collection Time: 11/22/17  8:00 PM  Result Value Ref Range Status   Specimen Description BLOOD LEFT ANTECUBITAL  Final   Special Requests   Final    BOTTLES DRAWN AEROBIC AND ANAEROBIC Blood Culture results may not be optimal due to an excessive volume of blood received in culture bottles   Culture  Setup Time   Final     GRAM POSITIVE COCCI IN BOTH AEROBIC AND ANAEROBIC BOTTLES CRITICAL RESULT CALLED TO, READ BACK BY AND VERIFIED WITHBronwen Betters [facility] [id number] 11/23/17 A BROWNING Performed at [facility], [place] [address] [place], [place] [address]    Culture VIRIDANS STREPTOCOCCUS (A)  Final   Report Status 11/25/2017 FINAL  Final   Organism ID, Bacteria VIRIDANS STREPTOCOCCUS  Final  Susceptibility   Viridans streptococcus - MIC*    PENICILLIN <=0.06 SENSITIVE Sensitive     CEFTRIAXONE <=0.12 SENSITIVE Sensitive     ERYTHROMYCIN <=0.12 SENSITIVE Sensitive     LEVOFLOXACIN 0.5 SENSITIVE Sensitive     VANCOMYCIN 0.5 SENSITIVE Sensitive     * VIRIDANS STREPTOCOCCUS  Blood Culture ID Panel (Reflexed)     Status: Abnormal   Collection Time: 11/22/17  8:00 PM  Result Value Ref Range Status   Enterococcus species NOT DETECTED NOT DETECTED Final   Listeria monocytogenes NOT DETECTED NOT DETECTED Final   Staphylococcus species NOT DETECTED NOT DETECTED Final   Staphylococcus aureus NOT DETECTED NOT DETECTED Final   Streptococcus species DETECTED (A) NOT DETECTED Final    Comment: Not Enterococcus species, Streptococcus agalactiae, Streptococcus pyogenes, or Streptococcus pneumoniae. CRITICAL RESULT CALLED TO, READ BACK BY AND VERIFIED WITH: Bronwen Betters PHARMD [id number] 11/23/17 A BROWNING    Streptococcus agalactiae NOT DETECTED NOT DETECTED Final   Streptococcus pneumoniae NOT DETECTED NOT DETECTED Final   Streptococcus pyogenes NOT DETECTED NOT DETECTED Final   Acinetobacter baumannii NOT DETECTED NOT DETECTED Final   Enterobacteriaceae species NOT DETECTED NOT DETECTED Final   Enterobacter cloacae complex NOT DETECTED NOT DETECTED Final   Escherichia coli NOT DETECTED NOT DETECTED Final   Klebsiella oxytoca NOT DETECTED NOT DETECTED Final   Klebsiella pneumoniae NOT DETECTED NOT DETECTED Final   Proteus species NOT DETECTED NOT DETECTED Final   Serratia marcescens NOT DETECTED NOT DETECTED Final    Haemophilus influenzae NOT DETECTED NOT DETECTED Final   Neisseria meningitidis NOT DETECTED NOT DETECTED Final   Pseudomonas aeruginosa NOT DETECTED NOT DETECTED Final   Candida albicans NOT DETECTED NOT DETECTED Final   Candida glabrata NOT DETECTED NOT DETECTED Final   Candida krusei NOT DETECTED NOT DETECTED Final   Candida parapsilosis NOT DETECTED NOT DETECTED Final   Candida tropicalis NOT DETECTED NOT DETECTED Final    Comment: Performed at [facility], [place]. [address] [place], [place] [address]  Culture, blood (Routine x 2)     Status: Abnormal   Collection Time: 11/22/17  8:02 PM  Result Value Ref Range Status   Specimen Description BLOOD RIGHT ANTECUBITAL  Final   Special Requests   Final    BOTTLES DRAWN AEROBIC AND ANAEROBIC Blood Culture adequate volume   Culture  Setup Time   Final    GRAM POSITIVE COCCI IN BOTH AEROBIC AND ANAEROBIC BOTTLES CRITICAL RESULT CALLED TO, READ BACK BY AND VERIFIED WITH: Bronwen Betters PHARMD [id number] 11/23/17 A BROWNING    Culture (A)  Final    VIRIDANS STREPTOCOCCUS SUSCEPTIBILITIES PERFORMED ON PREVIOUS CULTURE WITHIN THE LAST 5 DAYS. Performed at [facility], [place] [address] [place], [place] [address]    Report Status 11/25/2017 FINAL  Final  Urine culture     Status: None   Collection Time: 11/22/17  8:36 PM  Result Value Ref Range Status   Specimen Description URINE, CATHETERIZED  Final   Special Requests NONE  Final   Culture   Final    NO GROWTH Performed at [facility], [place] [address], [place], [place] [address]    Report Status 11/24/2017 FINAL  Final  Culture, blood (routine x 2)     Status: None (Preliminary result)   Collection Time: 11/24/17  3:47 PM  Result Value Ref Range Status   Specimen Description BLOOD LEFT ANTECUBITAL  Final   Special Requests IN PEDIATRIC BOTTLE Blood Culture adequate  volume  Final   Culture   Final    NO GROWTH 3 DAYS Performed at [facility], [place] [address],  [place], [place] [address]    Report Status PENDING  Incomplete  Culture, blood (routine x 2)     Status: Abnormal   Collection Time: 11/24/17  3:53 PM  Result Value Ref Range Status   Specimen Description BLOOD LEFT HAND  Final   Special Requests IN PEDIATRIC BOTTLE Blood Culture adequate volume  Final   Culture  Setup Time   Final    GRAM POSITIVE COCCI IN PEDIATRIC BOTTLE CRITICAL VALUE NOTED.  VALUE IS CONSISTENT WITH PREVIOUSLY REPORTED AND CALLED VALUE.    Culture (A)  Final    VIRIDANS STREPTOCOCCUS SUSCEPTIBILITIES PERFORMED ON PREVIOUS CULTURE WITHIN THE LAST 5 DAYS. Performed at [facility], [place] [address], [place], [place] [address]    Report Status 11/27/2017 FINAL  Final  Culture, blood (routine x 2)     Status: None (Preliminary result)   Collection Time: 11/26/17 10:49 AM  Result Value Ref Range Status   Specimen Description BLOOD RIGHT ANTECUBITAL  Final   Special Requests   Final    BOTTLES DRAWN AEROBIC AND ANAEROBIC Blood Culture adequate volume   Culture   Final    NO GROWTH 1 DAY Performed at [facility], [place] [address] [place], [place] [address]    Report Status PENDING  Incomplete  Culture, blood (routine x 2)     Status: None (Preliminary result)   Collection Time: 11/26/17 10:55 AM  Result Value Ref Range Status   Specimen Description BLOOD LEFT ANTECUBITAL  Final   Special Requests   Final    BOTTLES DRAWN AEROBIC AND ANAEROBIC Blood Culture results may not be optimal due to an excessive volume of blood received in culture bottles   Culture   Final    NO GROWTH 1 DAY Performed at [facility], [place] [address], [place], [place] [address]    Report Status PENDING  Incomplete      Radiology Studies: Dg Orthopantogram  Result Date: 11/26/2017 CLINICAL DATA:  Bacteremia. EXAM: ORTHOPANTOGRAM/PANORAMIC COMPARISON:  None. FINDINGS: Periapical lucency is seen involving a left mandibular premolar and remaining right mandibular molars.  IMPRESSION: Periapical lucency involving both remain right mandibular molars and left mandibular premolar. Consider dental referral and dedicated dental radiographs for further evaluation. Electronically Signed   By: Earle Gell M.D.   On: 11/26/2017 19:44   Ct Head Wo Contrast  Result Date: 11/27/2017 CLINICAL DATA:  Bacteremia.  Generalized weakness.  Confusion. EXAM: CT HEAD WITHOUT CONTRAST TECHNIQUE: Contiguous axial images were obtained from the base of the skull through the vertex without intravenous contrast. COMPARISON:  10/31/2017 FINDINGS: Brain: Similar findings of advanced atrophy with sulcal prominence and prominence of the bifrontal extra-axial spaces. Minimal periventricular hypodensities compatible microvascular ischemic disease. No CT evidence of acute large territory infarct. No intraparenchymal or extra-axial mass or hemorrhage. Unchanged size and configuration of the ventricles and the basilar cisterns. No midline shift. Vascular: Intracranial atherosclerosis. Skull: No displaced calvarial fracture. Chronic leftward deviation of the nasal bones. Sinuses/Orbits: Paranasal sinuses and mastoid air cells are normally aerated. No air-fluid levels. Debris is seen within the bilateral external auditory canals. Post bilateral cataract surgery. Other: Regional soft tissues appear normal. IMPRESSION: Similar findings of microvascular ischemic disease and markedly advanced atrophy without superimposed acute intracranial process. Electronically Signed   By: Eldridge Abrahams.D.  On: 11/27/2017 09:23     Scheduled Meds: . allopurinol  300 mg Oral Daily  . cholecalciferol  1,000 Units Oral Daily  . diltiazem  120 mg Oral Daily  . donepezil  10 mg Oral QHS  . erythromycin  1 application Both Eyes Daily  . linaclotide  290 mcg Oral QAC breakfast  . midodrine  10 mg Oral TID WC  . multivitamin with minerals  1 tablet Oral Q breakfast  . tamsulosin  0.4 mg Oral Daily  . Warfarin - Pharmacist Dosing  Inpatient   Does not apply q1800   Continuous Infusions: . sodium chloride 1,000 mL (11/25/17 2049)  . cefTRIAXone (ROCEPHIN)  IV Stopped (11/28/17 1216)     LOS: 5 days   Time Spent in minutes   30 minutes  Rhodie Cienfuegos D.O. on 11/28/2017 at 1:05 PM  Between 7am to 7pm - Pager - [phone]  After 7pm go to [email or web address] - password [email or web address]  And look for the night coverage person covering for me after hours  [facility] Office  [phone]

## 2017-11-28 NOTE — Progress Notes (Signed)
Called son for PICC consent, no answer. Will try again at a later time.

## 2017-11-29 DIAGNOSIS — K047 Periapical abscess without sinus: Secondary | ICD-10-CM

## 2017-11-29 DIAGNOSIS — R7881 Bacteremia: Secondary | ICD-10-CM

## 2017-11-29 LAB — BASIC METABOLIC PANEL
ANION GAP: 8 (ref 5–15)
BUN: 17 mg/dL (ref 6–20)
CHLORIDE: 109 mmol/L (ref 101–111)
CO2: 22 mmol/L (ref 22–32)
Calcium: 8.3 mg/dL — ABNORMAL LOW (ref 8.9–10.3)
Creatinine, Ser: 0.96 mg/dL (ref 0.61–1.24)
GFR calc non Af Amer: >60 mL/min (ref >60)
Glucose, Bld: 96 mg/dL (ref 65–99)
Potassium: 3.9 mmol/L (ref 3.5–5.1)
Sodium: 139 mmol/L (ref 135–145)

## 2017-11-29 LAB — CBC
HCT: 30.4 % — ABNORMAL LOW (ref 39.0–52.0)
HEMOGLOBIN: 10.4 g/dL — AB (ref 13.0–17.0)
MCH: 32.6 pg (ref 26.0–34.0)
MCHC: 34.2 g/dL (ref 30.0–36.0)
MCV: 95.3 fL (ref 78.0–100.0)
Platelets: 126 10*3/uL — ABNORMAL LOW (ref 150–400)
RBC: 3.19 MIL/uL — AB (ref 4.22–5.81)
RDW: 14.9 % (ref 11.5–15.5)
WBC: 7 10*3/uL (ref 4.0–10.5)

## 2017-11-29 LAB — CULTURE, BLOOD (ROUTINE X 2)
CULTURE: NO GROWTH
Special Requests: ADEQUATE

## 2017-11-29 LAB — PROTIME-INR
INR: 2.37
Prothrombin Time: 25.7 seconds — ABNORMAL HIGH (ref 11.4–15.2)

## 2017-11-29 MED ORDER — WARFARIN SODIUM 7.5 MG PO TABS
7.5000 mg | ORAL_TABLET | Freq: Once | ORAL | Status: AC
  Administered 2017-11-29: 7.5 mg via ORAL
  Filled 2017-11-29: qty 1

## 2017-11-29 NOTE — Progress Notes (Signed)
[facility] for Infectious Disease    Date of Admission:  11/22/2017   Total days of antibiotics 8        Day 6 ceftriaxone           ID: Peter Moreno is a 84 y.o. male with  Viridans streptococcal complicated bacteremia Principal Problem:   UTI (urinary tract infection) Active Problems:   Atrial fibrillation with RVR (HCC)   Essential hypertension   Long term (current) use of anticoagulants   Alzheimer's dementia   Sepsis ([place])   Hypotension   Hyponatremia    Subjective: Denies fever, chills, nightsweats or diarrhea.  Medications:  . allopurinol  300 mg Oral Daily  . cholecalciferol  1,000 Units Oral Daily  . diltiazem  120 mg Oral Daily  . donepezil  10 mg Oral QHS  . erythromycin  1 application Both Eyes Daily  . linaclotide  290 mcg Oral QAC breakfast  . midodrine  10 mg Oral TID WC  . multivitamin with minerals  1 tablet Oral Q breakfast  . sodium chloride flush  10-40 mL Intracatheter Q12H  . tamsulosin  0.4 mg Oral Daily  . warfarin  7.5 mg Oral ONCE-1800  . Warfarin - Pharmacist Dosing Inpatient   Does not apply q1800    Objective: Vital signs in last 24 hours: Temp:  [97.8 F (36.6 C)-98.6 F (37 C)] 98.6 F (37 C) (02/25 0347) Pulse Rate:  [71-92] 92 (02/24 2355) Resp:  [10-17] 10 (02/24 2355) BP: (95-133)/(60-85) 133/85 (02/24 2355) SpO2:  [98 %-100 %] 98 % (02/24 2355) Physical Exam  Constitutional: He is oriented to person, place, and time. He appears well-developed and well-nourished. No distress.  HENT: poor dentition Mouth/Throat: Oropharynx is clear and moist. No oropharyngeal exudate.  Cardiovascular: Normal rate, regular rhythm and normal heart sounds. Exam reveals no gallop and no friction rub.  No murmur heard.  Pulmonary/Chest: Effort normal and breath sounds normal. No respiratory distress. He has no wheezes.  Abdominal: Soft. Bowel sounds are normal. He exhibits no distension. There is no tenderness.  Lymphadenopathy:  He has no  cervical adenopathy.  Neurological: He is alert and oriented to person, place, and time.  Skin: Skin is warm and dry. No rash noted. No erythema.  Psychiatric: He has a normal mood and affect. His behavior is normal.     Lab Results Recent Labs    11/28/17 0219 11/29/17 0221  WBC 8.3 7.0  HGB 10.8* 10.4*  HCT 31.7* 30.4*  NA 138 139  K 3.9 3.9  CL 109 109  CO2 21* 22  BUN 22* 17  CREATININE 0.94 0.96    Microbiology: 2/20 blood cx viridans strep 2/22 blood cx ngtd Studies/Results: Dg Chest Port 1 View  Result Date: 11/28/2017 CLINICAL DATA:  Evaluate for PICC line placement EXAM: PORTABLE CHEST 1 VIEW COMPARISON:  11/22/2017 FINDINGS: Right PICC tip projects in the lower superior vena cava at the level of the caval atrial junction. There is lung base opacity that has increased when compared to the prior study, more notable on the right. This is likely a combination of small effusions with either atelectasis or infiltrate. There is no pulmonary edema. Heart is top-normal in size.  No mediastinal or hilar masses. No pneumothorax. IMPRESSION: 1. Right PICC tip projects in the lower superior vena cava at the level of the caval atrial junction. 2. Increased lung base opacity since prior study more notable on the right. This is likely combination of small  effusions with atelectasis or pneumonia. No evidence of pulmonary edema. Electronically Signed   By: Lajean Manes M.D.   On: 11/28/2017 18:22   FINDINGS: Periapical lucency is seen involving a left mandibular premolar and remaining right mandibular molars.  IMPRESSION: Periapical lucency involving both remain right mandibular molars and left mandibular premolar. Consider dental referral and dedicated dental radiographs for further evaluation.  Assessment/Plan: 40LH M with complicated streptococcal bacteremia, presumed endocarditis. Pantogram showing source of bacteremia could be due to abscess tooth.  Agree with reaching out  to dentistry for tooth extraction, preferably to do while hospitalized  Continue with plan to treat for 28d using 2/22 as day 1 with ceftriaxone 2gm IV daily. Will need weekly cbc, bmp. Can pull picc line at completion of abtx.  Will see back in the ID clinic in 3-4 wk.   Home health orders listed below.  Diagnosis: Streptococcal bacteremia  Culture Result: viridans strep  Allergies  Allergen Reactions  . Adhesive [Tape] Other (See Comments)    THE PATIENT'S SKIN IS VERY THIN AND WILL TEAR AND BRUISE VERY EASILY!!  . Sunflower Oil Itching  . Penicillins Rash    Has patient had a PCN reaction causing immediate rash, facial/tongue/throat swelling, SOB or lightheadedness with hypotension: Yes Has patient had a PCN reaction causing severe rash involving mucus membranes or skin necrosis: Unk Has patient had a PCN reaction that required hospitalization: No Has patient had a PCN reaction occurring within the last 10 years: No If all of the above answers are "NO", then may proceed with Cephalosporin use.     OPAT Orders Discharge antibiotics: Ceftriaxone 2gm iv daily Duration: 28 days End Date: March 22nd  [place] Per Protocol:  Labs weekly while on IV antibiotics: __x CBC with differential _x_ BMP  _x_ Please pull PIC at completion of IV antibiotics   Fax weekly labs to [phone]  Clinic Follow Up Appt: 3-4 wk  @ [facility] clinic    [facility] Cell: [phone] Pager: [phone]  11/29/2017, 1:17 PM

## 2017-11-29 NOTE — Progress Notes (Signed)
PT Cancellation Note  Patient Details Name: Aarnav Steagall MRN: [id number] DOB: [birth year]   Cancelled Treatment:    Reason Eval/Treat Not Completed: Fatigue/lethargy limiting ability to participate.  Pt reporting bad night with vivid dreams, requesting therapist to return after lunch today to complete evaluation.   Shann Medal, PT, DPT 11/29/17 10:05 AM

## 2017-11-29 NOTE — Progress Notes (Signed)
Rehab Admissions Coordinator Note:  Patient was screened by Cleatrice Burke for appropriateness for an Inpatient Acute Rehab Consult per PT recommendation.  At this time, we are recommending [place] or [facility] with family support. Likely not to meet medical neccesity guidelines for an inpt rehab admit.  Cleatrice Burke 11/29/2017, 2:06 PM  I can be reached at [phone].

## 2017-11-29 NOTE — Care Management Important Message (Signed)
Important Message  Patient Details  Name: Peter Moreno MRN: [id number] Date of Birth: [birth year]   Medicare Important Message Given:  Yes    Diogo Anne 11/29/2017, 1:33 PM

## 2017-11-29 NOTE — Progress Notes (Signed)
PROGRESS NOTE    Dejour Vos  IZT:[id number] DOB: 09-04-1927 DOA: 11/22/2017 PCP: Cyndy Freeze, MD   Chief Complaint  Patient presents with  . Weakness    Brief Narrative:  HPI On 11/22/2017 by Dr. Ivor Costa Aqil Goetting is a 84 y.o. male with medical history significant of hypertension, atrial fibrillation on Coumadin, skin cancer, dementia, gout, who presents with difficulty urinating and urge for urination.  Per his son, patient has been having generalized weakness in the past 6 weeks. His son states that the patient has generalized weakness, but no unilateral weakness or numbness in extremities. Patient seems to have mild right facial droop, but his son states that is normal to patient due to missing teeth. In the past several days, patient has been having difficult urinating. He has urge for urination all the time. He does not seem to have dysuria per his son. Patient does not have chest pain, SOB or cough. His son states that his blood pressure has been running low in the past 6 weeks. He was seen in the ED of [place] and was started with Midodrine, without clear diagnosis. Patient does not have nausea, vomiting or diarrhea. Pt had one or 2 episodes of confusion recently, but not today. Pt was found to have urinary retention and hematuria in ED. Foley cath was placed.  Interim history Admitted and found to have sepsis secondary to strep viridans bacteremia.   Assessment & Plan   Sepsis secondary strep bacteremia -Patient presented with leukocytosis, hypotension and was febrile -leukocytosis and fever have resolved -UA: WBC TNTC, no bacteria seen, moderate leukocytes -Urine culture show no growth -Blood culture 11/22/2017: 2/2 strep viridans -blood culture from 11/24/2017 strep viridans -Initially placed on aztreonam due to penicillin allergy as well as vancomycin -transitioned to ceftriaxone -Given that he has persistent bacteremia, infectious disease consulted and  appreciated -Echocardiogram shows EF 65-70%, calcified aortic valve with moderate   AS and trace AI; mild MR; biatrial enlargement; mild RVE; mild TR wiith mild pulmonary hypertension -Given that patient's age and mild dementia, will forgo obtaining TEE and continue antibiotics for 28 days from day of negative blood cultures. Discussed with patient's son, agreed to forgo TEE.  -repeat blood cultures on 2/22 and show no growth to date -obtained CT head: Microvascular ischemic disease, advanced atrophy without superimposed acute intracranial process -Orthopantogram showed periapical lucency involving both main right mandibular molars and left mandibular premolar. -PICC line placed on 2/24 -message left with Dr. Enrique Sack (dentistry)  Hypotension -resolved -Per family has been ongoing for approximately 6 weeks -Patient was recently started on midodrine -Etiology unknown -Was given dose of Solu-Cortef upon admission -BP does appear to be improved  Acute kidney injury on chronic kidney disease, stage III -resolved -Creatinine on admission 1.50, baseline approximately 1.2 -Currently 0.96 -Continue to monitor BMP  Atrial fibrillation -CHADSVASC 3 -on coumadin, INR 2.37 -Continue cardizem  Urinary retention -Foley catheter placed  -Continue flomax -will likely need outpatient urology follow up  Essential hypertension -As noted above, patient has been hypotensive -lotensin held  Dementia -Continue donepezil   Hyponatremia -resolved, continue to monitor BMP  Aortic stenosis -Noted on echocardiogram in July 2017, mild   Deconditioning -Likely secondary to the above processes as well as prolonged hospital stay -PT and OT consulted and appreciated -Suspect patient will likely need SNF  DVT Prophylaxis Coumadin  Code Status: DNR  Family Communication: None at bedside.  Son via phone  Disposition Plan: Admitted. Pending PT/OT.  Consultants Infectious disease  Dentistry  (left message)  Procedures  Echocardiogram Orthopantogram PICC line placement  Antibiotics   Anti-infectives (From admission, onward)   Start     Dose/Rate Route Frequency Ordered Stop   11/24/17 2200  vancomycin (VANCOCIN) IVPB 750 mg/150 ml premix  Status:  Discontinued     750 mg 150 mL/hr over 60 Minutes Intravenous Every 24 hours 11/23/17 2101 11/23/17 2105   11/24/17 2000  levofloxacin (LEVAQUIN) IVPB 750 mg  Status:  Discontinued     750 mg 100 mL/hr over 90 Minutes Intravenous Every 48 hours 11/22/17 2136 11/23/17 0038   11/24/17 1200  cefTRIAXone (ROCEPHIN) 2 g in sodium chloride 0.9 % 100 mL IVPB     2 g 200 mL/hr over 30 Minutes Intravenous Every 24 hours 11/24/17 1031     11/23/17 2200  vancomycin (VANCOCIN) IVPB 750 mg/150 ml premix  Status:  Discontinued     750 mg 150 mL/hr over 60 Minutes Intravenous Every 24 hours 11/22/17 2136 11/23/17 0038   11/23/17 2200  vancomycin (VANCOCIN) IVPB 750 mg/150 ml premix  Status:  Discontinued     750 mg 150 mL/hr over 60 Minutes Intravenous Every 24 hours 11/23/17 2105 11/24/17 1031   11/23/17 2115  vancomycin (VANCOCIN) IVPB 1000 mg/200 mL premix  Status:  Discontinued     1,000 mg 200 mL/hr over 60 Minutes Intravenous  Once 11/23/17 2101 11/23/17 2105   11/23/17 0600  aztreonam (AZACTAM) 1 g in sodium chloride 0.9 % 100 mL IVPB  Status:  Discontinued     1 g 200 mL/hr over 30 Minutes Intravenous Every 8 hours 11/22/17 2136 11/24/17 1031   11/22/17 2000  levofloxacin (LEVAQUIN) IVPB 750 mg     750 mg 100 mL/hr over 90 Minutes Intravenous  Once 11/22/17 1958 11/22/17 2315   11/22/17 2000  aztreonam (AZACTAM) 2 g in sodium chloride 0.9 % 100 mL IVPB     2 g 200 mL/hr over 30 Minutes Intravenous  Once 11/22/17 1958 11/23/17 0022   11/22/17 2000  vancomycin (VANCOCIN) IVPB 1000 mg/200 mL premix     1,000 mg 200 mL/hr over 60 Minutes Intravenous  Once 11/22/17 1958 11/23/17 0139      Subjective:   [name] seen and  examined today.  Patient denies any current chest pain, shortness breath, abdominal pain, nausea vomiting, diarrhea or constipation.  Objective:   Vitals:   11/28/17 1324 11/28/17 1935 11/28/17 2355 11/29/17 0347  BP: 95/60 129/84 133/85   Pulse: 71 78 92   Resp: 17 17 10    Temp: 97.8 F (36.6 C) 98.1 F (36.7 C) 98.2 F (36.8 C) 98.6 F (37 C)  TempSrc: Oral Oral Oral Oral  SpO2: 99% 100% 98%   Weight:      Height:        Intake/Output Summary (Last 24 hours) at 11/29/2017 1017 Last data filed at 11/29/2017 0000 Gross per 24 hour  Intake 250 ml  Output 1700 ml  Net -1450 ml   Filed Weights   11/22/17 1945 11/23/17 0748  Weight: 62.1 kg (137 lb) 62.1 kg (137 lb)   Exam  General: Well developed, elderly, thin, appears stated age  58: NCAT, poor dentition, mucous membranes moist.   Neck: Supple  Cardiovascular: S1 S2 auscultated, 2/6 SEM, irregular  Respiratory: Clear to auscultation bilaterally with equal chest rise  Abdomen: Soft, nontender, nondistended, + bowel sounds  Extremities: warm dry without cyanosis clubbing or edema  Neuro: AAOx3, nonfocal  Psych: appropriate mood and  affect, pleasant  Data Reviewed: I have personally reviewed following labs and imaging studies  CBC: Recent Labs  Lab 11/22/17 2037  11/25/17 0237 11/26/17 0328 11/27/17 0224 11/28/17 0219 11/29/17 0221  WBC 13.3*   < > 8.1 9.1 8.3 8.3 7.0  NEUTROABS 12.5*  --   --   --   --   --   --   HGB 11.1*   < > 11.0* 10.4* 10.1* 10.8* 10.4*  HCT 32.7*   < > 32.0* 30.5* 30.5* 31.7* 30.4*  MCV 94.0   < > 93.8 93.3 94.4 94.6 95.3  PLT 136*   < > 128* 131* 130* 139* 126*   < > = values in this interval not displayed.   Basic Metabolic Panel: Recent Labs  Lab 11/25/17 0237 11/26/17 0328 11/27/17 0224 11/28/17 0219 11/29/17 0221  NA 135 137 136 138 139  K 3.8 3.7 3.7 3.9 3.9  CL 111 109 109 109 109  CO2 17* 21* 21* 21* 22  GLUCOSE 86 101* 97 99 96  BUN 28* 23* 19 22* 17   CREATININE 0.93 0.98 0.94 0.94 0.96  CALCIUM 8.2* 8.3* 8.2* 8.4* 8.3*   GFR: Estimated Creatinine Clearance: 46.7 mL/min (by C-G formula based on SCr of 0.96 mg/dL). Liver Function Tests: Recent Labs  Lab 11/22/17 2037  AST 20  ALT 11*  ALKPHOS 53  BILITOT 0.9  PROT 5.9*  ALBUMIN 3.3*   No results for input(s): LIPASE, AMYLASE in the last 168 hours. No results for input(s): AMMONIA in the last 168 hours. Coagulation Profile: Recent Labs  Lab 11/25/17 0237 11/26/17 0328 11/27/17 0224 11/28/17 0219 11/29/17 0221  INR 1.90 2.14 2.47 2.60 2.37   Cardiac Enzymes: No results for input(s): CKTOTAL, CKMB, CKMBINDEX, TROPONINI in the last 168 hours. BNP (last 3 results) No results for input(s): PROBNP in the last 8760 hours. HbA1C: No results for input(s): HGBA1C in the last 72 hours. CBG: Recent Labs  Lab 11/25/17 1623 11/26/17 1110 11/26/17 1611  GLUCAP 119* 104* 118*   Lipid Profile: No results for input(s): CHOL, HDL, LDLCALC, TRIG, CHOLHDL, LDLDIRECT in the last 72 hours. Thyroid Function Tests: No results for input(s): TSH, T4TOTAL, FREET4, T3FREE, THYROIDAB in the last 72 hours. Anemia Panel: No results for input(s): VITAMINB12, FOLATE, FERRITIN, TIBC, IRON, RETICCTPCT in the last 72 hours. Urine analysis:    Component Value Date/Time   COLORURINE YELLOW 11/22/2017 2227   APPEARANCEUR HAZY (A) 11/22/2017 2227   LABSPEC 1.011 11/22/2017 2227   PHURINE 6.0 11/22/2017 2227   GLUCOSEU NEGATIVE 11/22/2017 2227   HGBUR LARGE (A) 11/22/2017 2227   BILIRUBINUR NEGATIVE 11/22/2017 2227   KETONESUR NEGATIVE 11/22/2017 2227   PROTEINUR NEGATIVE 11/22/2017 2227   NITRITE NEGATIVE 11/22/2017 2227   LEUKOCYTESUR MODERATE (A) 11/22/2017 2227   Sepsis Labs: @LABRCNTIP (procalcitonin:4,lacticidven:4)  ) Recent Results (from the past 240 hour(s))  Culture, blood (Routine x 2)     Status: Abnormal   Collection Time: 11/22/17  8:00 PM  Result Value Ref Range Status    Specimen Description BLOOD LEFT ANTECUBITAL  Final   Special Requests   Final    BOTTLES DRAWN AEROBIC AND ANAEROBIC Blood Culture results may not be optimal due to an excessive volume of blood received in culture bottles   Culture  Setup Time   Final    GRAM POSITIVE COCCI IN BOTH AEROBIC AND ANAEROBIC BOTTLES CRITICAL RESULT CALLED TO, READ BACK BY AND VERIFIED WITH: Bronwen Betters PHARMD 1636 11/23/17 A BROWNING  Performed at [facility], [place] [address], [place], [place] [address]    Culture VIRIDANS STREPTOCOCCUS (A)  Final   Report Status 11/25/2017 FINAL  Final   Organism ID, Bacteria VIRIDANS STREPTOCOCCUS  Final      Susceptibility   Viridans streptococcus - MIC*    PENICILLIN <=0.06 SENSITIVE Sensitive     CEFTRIAXONE <=0.12 SENSITIVE Sensitive     ERYTHROMYCIN <=0.12 SENSITIVE Sensitive     LEVOFLOXACIN 0.5 SENSITIVE Sensitive     VANCOMYCIN 0.5 SENSITIVE Sensitive     * VIRIDANS STREPTOCOCCUS  Blood Culture ID Panel (Reflexed)     Status: Abnormal   Collection Time: 11/22/17  8:00 PM  Result Value Ref Range Status   Enterococcus species NOT DETECTED NOT DETECTED Final   Listeria monocytogenes NOT DETECTED NOT DETECTED Final   Staphylococcus species NOT DETECTED NOT DETECTED Final   Staphylococcus aureus NOT DETECTED NOT DETECTED Final   Streptococcus species DETECTED (A) NOT DETECTED Final    Comment: Not Enterococcus species, Streptococcus agalactiae, Streptococcus pyogenes, or Streptococcus pneumoniae. CRITICAL RESULT CALLED TO, READ BACK BY AND VERIFIED WITH: Bronwen Betters PHARMD 2130 11/23/17 A BROWNING    Streptococcus agalactiae NOT DETECTED NOT DETECTED Final   Streptococcus pneumoniae NOT DETECTED NOT DETECTED Final   Streptococcus pyogenes NOT DETECTED NOT DETECTED Final   Acinetobacter baumannii NOT DETECTED NOT DETECTED Final   Enterobacteriaceae species NOT DETECTED NOT DETECTED Final   Enterobacter cloacae complex NOT DETECTED NOT DETECTED Final   Escherichia  coli NOT DETECTED NOT DETECTED Final   Klebsiella oxytoca NOT DETECTED NOT DETECTED Final   Klebsiella pneumoniae NOT DETECTED NOT DETECTED Final   Proteus species NOT DETECTED NOT DETECTED Final   Serratia marcescens NOT DETECTED NOT DETECTED Final   Haemophilus influenzae NOT DETECTED NOT DETECTED Final   Neisseria meningitidis NOT DETECTED NOT DETECTED Final   Pseudomonas aeruginosa NOT DETECTED NOT DETECTED Final   Candida albicans NOT DETECTED NOT DETECTED Final   Candida glabrata NOT DETECTED NOT DETECTED Final   Candida krusei NOT DETECTED NOT DETECTED Final   Candida parapsilosis NOT DETECTED NOT DETECTED Final   Candida tropicalis NOT DETECTED NOT DETECTED Final    Comment: Performed at [facility], [place]. [address] [place], [place] [address]  Culture, blood (Routine x 2)     Status: Abnormal   Collection Time: 11/22/17  8:02 PM  Result Value Ref Range Status   Specimen Description BLOOD RIGHT ANTECUBITAL  Final   Special Requests   Final    BOTTLES DRAWN AEROBIC AND ANAEROBIC Blood Culture adequate volume   Culture  Setup Time   Final    GRAM POSITIVE COCCI IN BOTH AEROBIC AND ANAEROBIC BOTTLES CRITICAL RESULT CALLED TO, READ BACK BY AND VERIFIED WITH: Bronwen Betters PHARMD 1636 11/23/17 A BROWNING    Culture (A)  Final    VIRIDANS STREPTOCOCCUS SUSCEPTIBILITIES PERFORMED ON PREVIOUS CULTURE WITHIN THE LAST 5 DAYS. Performed at [facility], [place] [address], [place], [place] [address]    Report Status 11/25/2017 FINAL  Final  Urine culture     Status: None   Collection Time: 11/22/17  8:36 PM  Result Value Ref Range Status   Specimen Description URINE, CATHETERIZED  Final   Special Requests NONE  Final   Culture   Final    NO GROWTH Performed at [facility], [place] [address], [place],  [address]    Report Status 11/24/2017 FINAL  Final  Culture, blood (  routine x 2)     Status: None (Preliminary result)   Collection Time: 11/24/17  3:47 PM  Result  Value Ref Range Status   Specimen Description BLOOD LEFT ANTECUBITAL  Final   Special Requests IN PEDIATRIC BOTTLE Blood Culture adequate volume  Final   Culture   Final    NO GROWTH 4 DAYS Performed at [facility], [place] [address] [place], [place] [address]    Report Status PENDING  Incomplete  Culture, blood (routine x 2)     Status: Abnormal   Collection Time: 11/24/17  3:53 PM  Result Value Ref Range Status   Specimen Description BLOOD LEFT HAND  Final   Special Requests IN PEDIATRIC BOTTLE Blood Culture adequate volume  Final   Culture  Setup Time   Final    GRAM POSITIVE COCCI IN PEDIATRIC BOTTLE CRITICAL VALUE NOTED.  VALUE IS CONSISTENT WITH PREVIOUSLY REPORTED AND CALLED VALUE.    Culture (A)  Final    VIRIDANS STREPTOCOCCUS SUSCEPTIBILITIES PERFORMED ON PREVIOUS CULTURE WITHIN THE LAST 5 DAYS. Performed at [facility], [place] [address], [place], [place] [address]    Report Status 11/27/2017 FINAL  Final  Culture, blood (routine x 2)     Status: None (Preliminary result)   Collection Time: 11/26/17 10:49 AM  Result Value Ref Range Status   Specimen Description BLOOD RIGHT ANTECUBITAL  Final   Special Requests   Final    BOTTLES DRAWN AEROBIC AND ANAEROBIC Blood Culture adequate volume   Culture   Final    NO GROWTH 2 DAYS Performed at [facility], [place] [address], [place], [place] [address]    Report Status PENDING  Incomplete  Culture, blood (routine x 2)     Status: None (Preliminary result)   Collection Time: 11/26/17 10:55 AM  Result Value Ref Range Status   Specimen Description BLOOD LEFT ANTECUBITAL  Final   Special Requests   Final    BOTTLES DRAWN AEROBIC AND ANAEROBIC Blood Culture results may not be optimal due to an excessive volume of blood received in culture bottles   Culture   Final    NO GROWTH 2 DAYS Performed at [facility], [place] [address] [place], [place] [address]    Report Status PENDING  Incomplete       Radiology Studies: Dg Chest Port 1 View  Result Date: 11/28/2017 CLINICAL DATA:  Evaluate for PICC line placement EXAM: PORTABLE CHEST 1 VIEW COMPARISON:  11/22/2017 FINDINGS: Right PICC tip projects in the lower superior vena cava at the level of the caval atrial junction. There is lung base opacity that has increased when compared to the prior study, more notable on the right. This is likely a combination of small effusions with either atelectasis or infiltrate. There is no pulmonary edema. Heart is top-normal in size.  No mediastinal or hilar masses. No pneumothorax. IMPRESSION: 1. Right PICC tip projects in the lower superior vena cava at the level of the caval atrial junction. 2. Increased lung base opacity since prior study more notable on the right. This is likely combination of small effusions with atelectasis or pneumonia. No evidence of pulmonary edema. Electronically Signed   By: Lajean Manes M.D.   On: 11/28/2017 18:22     Scheduled Meds: . allopurinol  300 mg Oral Daily  . cholecalciferol  1,000 Units Oral Daily  . diltiazem  120 mg Oral Daily  . donepezil  10 mg Oral QHS  .  erythromycin  1 application Both Eyes Daily  . linaclotide  290 mcg Oral QAC breakfast  . midodrine  10 mg Oral TID WC  . multivitamin with minerals  1 tablet Oral Q breakfast  . sodium chloride flush  10-40 mL Intracatheter Q12H  . tamsulosin  0.4 mg Oral Daily  . Warfarin - Pharmacist Dosing Inpatient   Does not apply q1800   Continuous Infusions: . sodium chloride 1,000 mL (11/28/17 1406)  . cefTRIAXone (ROCEPHIN)  IV Stopped (11/28/17 1216)     LOS: 6 days   Time Spent in minutes   30 minutes  Morad Tal D.O. on 11/29/2017 at 10:17 AM  Between 7am to 7pm - Pager - [phone]  After 7pm go to [email or web address] - password [email or web address]  And look for the night coverage person covering for me after hours  [facility] Office  [phone]

## 2017-11-29 NOTE — Progress Notes (Signed)
ANTICOAGULATION CONSULT NOTE - Follow Up Consult  Pharmacy Consult for Coumadin Indication: atrial fibrillation  Allergies  Allergen Reactions  . Adhesive [Tape] Other (See Comments)    THE PATIENT'S SKIN IS VERY THIN AND WILL TEAR AND BRUISE VERY EASILY!!  . Sunflower Oil Itching  . Penicillins Rash    Has patient had a PCN reaction causing immediate rash, facial/tongue/throat swelling, SOB or lightheadedness with hypotension: Yes Has patient had a PCN reaction causing severe rash involving mucus membranes or skin necrosis: Unk Has patient had a PCN reaction that required hospitalization: No Has patient had a PCN reaction occurring within the last 10 years: No If all of the above answers are "NO", then may proceed with Cephalosporin use.     Patient Measurements: Height: 5\' 6"  (167.6 cm) Weight: 137 lb (62.1 kg) IBW/kg (Calculated) : 63.8.  Vital Signs: Temp: 98.6 F (37 C) (02/25 0347) Temp Source: Oral (02/25 0347) BP: 133/85 (02/24 2355) Pulse Rate: 92 (02/24 2355)  Labs: Recent Labs    11/27/17 0224 11/28/17 0219 11/29/17 0221  HGB 10.1* 10.8* 10.4*  HCT 30.5* 31.7* 30.4*  PLT 130* 139* 126*  LABPROT 26.6* 27.7* 25.7*  INR 2.47 2.60 2.37  CREATININE 0.94 0.94 0.96    Estimated Creatinine Clearance: 46.7 mL/min (by C-G formula based on SCr of 0.96 mg/dL).   Assessment:  Anticoag: Warfarin for hx Afib - INR = 2.37. CBC stable. CHADSVASC 3 -Home regimen:  5mg  MWF / 7.5mg  TTSS, admit INR 1.58  Goal of Therapy:  Heparin level 0.3-0.7 units/ml Monitor platelets by anticoagulation protocol: Yes   Plan:  Coumadin 7.5mg  po x 1 tonight Daily INR   Peter Moreno, PharmD, BCPS Clinical Staff Pharmacist Pager [phone]  [place], [place] 11/29/2017,11:21 AM

## 2017-11-29 NOTE — Progress Notes (Deleted)
HPI: FU atrial fibrillation. Nuclear study in April of 2014 showed an ejection fraction of 84% and no ischemia or infarction. Holter monitor in October 2014 showed atrial fibrillation with controlled ventricular response. Echocardiogram 2/19 showed normal LV function, moderate AS (mean gradient 24 mmHg), trace AI, mild MR, severe biatrial enlargement and mild RVE. Admitted with strep bacteremia 2/19. Since I saw him   No current facility-administered medications for this visit.    No current outpatient medications on file.   Facility-Administered Medications Ordered in Other Visits  Medication Dose Route Frequency Provider Last Rate Last Dose  . 0.9 %  sodium chloride infusion  1,000 mL Intravenous Continuous Cherene Altes, MD 75 mL/hr at 11/28/17 1406 1,000 mL at 11/28/17 1406  . acetaminophen (TYLENOL) tablet 650 mg  650 mg Oral Q6H PRN Ivor Costa, MD       Or  . acetaminophen (TYLENOL) suppository 650 mg  650 mg Rectal Q6H PRN Ivor Costa, MD      . allopurinol (ZYLOPRIM) tablet 300 mg  300 mg Oral Daily Ivor Costa, MD   300 mg at 11/28/17 0813  . cefTRIAXone (ROCEPHIN) 2 g in sodium chloride 0.9 % 100 mL IVPB  2 g Intravenous Q24H Reginia Naas, [facility]   Stopped at 11/28/17 1216  . cholecalciferol (VITAMIN D) tablet 1,000 Units  1,000 Units Oral Daily Ivor Costa, MD   1,000 Units at 11/28/17 0813  . diltiazem (CARDIZEM CD) 24 hr capsule 120 mg  120 mg Oral Daily Cristal Ford, DO   120 mg at 11/28/17 6962  . donepezil (ARICEPT) tablet 10 mg  10 mg Oral QHS Ivor Costa, MD   10 mg at 11/28/17 2041  . erythromycin ophthalmic ointment 1 application  1 application Both Eyes Daily Ivor Costa, MD   1 application at 95/28/41 [phone]  . linaclotide (LINZESS) capsule 290 mcg  290 mcg Oral QAC breakfast Ivor Costa, MD   290 mcg at 11/28/17 [phone]  . midodrine (PROAMATINE) tablet 10 mg  10 mg Oral TID WC Ivor Costa, MD   10 mg at 11/28/17 1730  . multivitamin with minerals tablet 1 tablet   1 tablet Oral Q breakfast Ivor Costa, MD   1 tablet at 11/28/17 0813  . ondansetron (ZOFRAN) tablet 4 mg  4 mg Oral Q6H PRN Ivor Costa, MD       Or  . ondansetron [facility]) injection 4 mg  4 mg Intravenous Q6H PRN Ivor Costa, MD      . polyethylene glycol (MIRALAX / GLYCOLAX) packet 17 g  17 g Oral Daily PRN Ivor Costa, MD      . sodium chloride flush (NS) 0.9 % injection 10-40 mL  10-40 mL Intracatheter Q12H Mikhail, [place], DO   10 mL at 11/28/17 2045  . sodium chloride flush (NS) 0.9 % injection 10-40 mL  10-40 mL Intracatheter PRN Mikhail, Velta Addison, DO      . sodium chloride flush (NS) 0.9 % injection 10-40 mL  10-40 mL Intracatheter PRN Cristal Ford, DO      . tamsulosin [facility]) capsule 0.4 mg  0.4 mg Oral Daily Cristal Ford, DO   0.4 mg at 11/28/17 7253  . Warfarin - Pharmacist Dosing Inpatient   Does not apply [id number] Cherene Altes, MD      . zolpidem (AMBIEN) tablet 5 mg  5 mg Oral QHS PRN Ivor Costa, MD         Past Medical History:  Diagnosis Date  .  Atrial fibrillation ([place])   . Hypertension   . Skin cancer     Past Surgical History:  Procedure Laterality Date  . Cataracts      Social History   Socioeconomic History  . Marital status: Widowed    Spouse name: Not on file  . Number of children: 3  . Years of education: Not on file  . Highest education level: Not on file  Social Needs  . Financial resource strain: Not on file  . Food insecurity - worry: Not on file  . Food insecurity - inability: Not on file  . Transportation needs - medical: Not on file  . Transportation needs - non-medical: Not on file  Occupational History  . Not on file  Tobacco Use  . Smoking status: Former Smoker    Packs/day: 3.00    Types: Cigarettes    Start date: 06/22/1949    Last attempt to quit: 06/22/1969    Years since quitting: 48.4  . Smokeless tobacco: Never Used  Substance and Sexual Activity  . Alcohol use: Yes    Comment: Occasional  . Drug use: Not on file    . Sexual activity: Not on file  Other Topics Concern  . Not on file  Social History Narrative  . Not on file    Family History  Problem Relation Age of Onset  . Dementia Mother   . Heart disease Unknown        No family history    ROS: no fevers or chills, productive cough, hemoptysis, dysphasia, odynophagia, melena, hematochezia, dysuria, hematuria, rash, seizure activity, orthopnea, PND, pedal edema, claudication. Remaining systems are negative.  Physical Exam: Well-developed well-nourished in no acute distress.  Skin is warm and dry.  HEENT is normal.  Neck is supple.  Chest is clear to auscultation with normal expansion.  Cardiovascular exam is regular rate and rhythm.  Abdominal exam nontender or distended. No masses palpated. Extremities show no edema. neuro grossly intact  ECG- personally reviewed  A/P  1  Kirk Ruths, MD

## 2017-11-29 NOTE — Evaluation (Signed)
Physical Therapy Evaluation Patient Details Name: Peter Moreno MRN: [id number] DOB: [birth year] Today's Date: 11/29/2017   History of Present Illness  Pt is a 84 y/o male admitted with progressive weakness/confusion found to have UTI with sepsis.  PMH significant for skin CA, HTN  Clinical Impression  Pt with above diagnosis with progressive weakness over the last 6 weeks.  Pt demonstrates significant decrease in functional mobility, limited by impairments in balance, strength, and safety awareness.  Pt would benefit from skilled acute PT to progress activity tolerance and functional mobility and f/u with post acute rehab at d/c to return to mod I prior level of function prior to returning home.     Follow Up Recommendations CIR;Supervision/Assistance - 24 hour    Equipment Recommendations  None recommended by PT    Recommendations for Other Services Rehab consult;OT consult     Precautions / Restrictions Precautions Precautions: Fall Restrictions Weight Bearing Restrictions: No      Mobility  Bed Mobility               General bed mobility comments: on BSC on arrival, per pt's son pt sleeps in a recliner at baseline  Transfers Overall transfer level: Needs assistance Equipment used: Rolling walker (2 wheeled);None Transfers: Sit to/from [employer] to Stand: Min assist Stand pivot transfers: Mod assist       General transfer comment: requires mod assist for stand/pivot from BSC>bed for lifting and steadying.  Min assist for sit<>stand from EOB to RW with cues for hand placement and tactile cues for forward weight shift.   Ambulation/Gait Ambulation/Gait assistance: Mod assist Ambulation Distance (Feet): 20 Feet(within room) Assistive device: Rolling walker (2 wheeled) Gait Pattern/deviations: Step-to pattern;Decreased step length - right;Decreased step length - left;Shuffle;Narrow base of support     General Gait Details: very unsteady on  feet, tremulous, with flexed posture and requiring cues/assist for walker positioning  Stairs            Wheelchair Mobility    Modified Rankin (Stroke Patients Only)       Balance Overall balance assessment: Needs assistance Sitting-balance support: Bilateral upper extremity supported;Feet supported Sitting balance-Leahy Scale: Poor     Standing balance support: Bilateral upper extremity supported;During functional activity Standing balance-Leahy Scale: Poor Standing balance comment: posterior lean in standing, corrects with cues                             Pertinent Vitals/Pain Pain Assessment: No/denies pain    Home Living Family/patient expects to be discharged to:: Private residence Living Arrangements: Children Available Help at Discharge: Available PRN/intermittently Type of Home: House Home Access: Ramped entrance     Home Layout: Able to live on main level with bedroom/bathroom Home Equipment: Cane - single point;Wheelchair - manual      Prior Function Level of Independence: Independent with assistive device(s)         Comments: pt reports using cane prior to admission     Hand Dominance        Extremity/Trunk Assessment        Lower Extremity Assessment Lower Extremity Assessment: Generalized weakness    Cervical / Trunk Assessment Cervical / Trunk Assessment: Kyphotic  Communication   Communication: HOH  Cognition Arousal/Alertness: Awake/alert Behavior During Therapy: WFL for tasks assessed/performed Overall Cognitive Status: Impaired/Different from baseline Area of Impairment: Memory;Safety/judgement  Memory: Decreased short-term memory   Safety/Judgement: Decreased awareness of safety            General Comments      Exercises     Assessment/Plan    PT Assessment Patient needs continued PT services  PT Problem List Decreased strength;Decreased range of motion;Decreased  activity tolerance;Decreased balance;Decreased mobility;Decreased coordination;Decreased knowledge of use of DME;Decreased safety awareness       PT Treatment Interventions DME instruction;Gait training;Functional mobility training;Therapeutic activities;Therapeutic exercise;Balance training;Neuromuscular re-education;Patient/family education    PT Goals (Current goals can be found in the Care Plan section)  Acute Rehab PT Goals Patient Stated Goal: none stated this session Time For Goal Achievement: 12/06/17 Potential to Achieve Goals: Fair    Frequency Min 3X/week   Barriers to discharge Decreased caregiver support caregivers able to provide intermittent supervision at the most    Co-evaluation               AM-PAC PT "6 Clicks" Daily Activity  Outcome Measure Difficulty turning over in bed (including adjusting bedclothes, sheets and blankets)?: A Little Difficulty moving from lying on back to sitting on the side of the bed? : A Little Difficulty sitting down on and standing up from a chair with arms (e.g., wheelchair, bedside commode, etc,.)?: A Little Help needed moving to and from a bed to chair (including a wheelchair)?: A Little Help needed walking in hospital room?: A Lot Help needed climbing 3-5 steps with a railing? : A Lot 6 Click Score: 16    End of Session Equipment Utilized During Treatment: Gait belt Activity Tolerance: Patient tolerated treatment well Patient left: in chair;with call bell/phone within reach;with family/visitor present Nurse Communication: Mobility status PT Visit Diagnosis: Unsteadiness on feet (R26.81);Muscle weakness (generalized) (M62.81)    Time: 6834-1962 PT Time Calculation (min) (ACUTE ONLY): 32 min   Charges:   PT Evaluation $PT Eval Low Complexity: 1 Low PT Treatments $Therapeutic Activity: 8-22 mins   PT G Codes:        Shann Medal, PT, DPT 11/29/17 1:59 PM

## 2017-11-30 ENCOUNTER — Encounter (HOSPITAL_COMMUNITY): Payer: Self-pay | Admitting: Dentistry

## 2017-11-30 ENCOUNTER — Ambulatory Visit: Payer: Medicare Other | Admitting: Cardiology

## 2017-11-30 LAB — CBC
HCT: 27.5 % — ABNORMAL LOW (ref 39.0–52.0)
Hemoglobin: 9.4 g/dL — ABNORMAL LOW (ref 13.0–17.0)
MCH: 32.5 pg (ref 26.0–34.0)
MCHC: 34.2 g/dL (ref 30.0–36.0)
MCV: 95.2 fL (ref 78.0–100.0)
PLATELETS: 109 10*3/uL — AB (ref 150–400)
RBC: 2.89 MIL/uL — ABNORMAL LOW (ref 4.22–5.81)
RDW: 15.1 % (ref 11.5–15.5)
WBC: 8.7 10*3/uL (ref 4.0–10.5)

## 2017-11-30 LAB — BASIC METABOLIC PANEL
Anion gap: 7 (ref 5–15)
BUN: 16 mg/dL (ref 6–20)
CALCIUM: 8.3 mg/dL — AB (ref 8.9–10.3)
CO2: 21 mmol/L — ABNORMAL LOW (ref 22–32)
CREATININE: 0.9 mg/dL (ref 0.61–1.24)
Chloride: 111 mmol/L (ref 101–111)
GFR calc Af Amer: >60 mL/min (ref >60)
GFR calc non Af Amer: >60 mL/min (ref >60)
GLUCOSE: 104 mg/dL — AB (ref 65–99)
Potassium: 3.6 mmol/L (ref 3.5–5.1)
Sodium: 139 mmol/L (ref 135–145)

## 2017-11-30 LAB — PROTIME-INR
INR: 2.57
PROTHROMBIN TIME: 27.4 s — AB (ref 11.4–15.2)

## 2017-11-30 NOTE — Plan of Care (Signed)
Patient has not slept all night. Not progressing. Very confused.

## 2017-11-30 NOTE — Progress Notes (Addendum)
Occupational Therapy Evaluation Patient Details Name: Peter Moreno MRN: [id number] DOB: 1927-07-09 Today's Date: 11/30/2017    History of Present Illness Pt is a 84 y/o male admitted with progressive weakness/confusion found to have UTI with sepsis.  PMH significant for skin CA, HTN   Clinical Impression   PTA, pt modified independent with ADL and mobility. Pt required Max A +2 for transfer to [facility] and had multiple incontinent episodes during session. Due to decline in function feel pt will benefit from rehab at [facility].  Pt unsafe to DC home at this level. Will follow acutely to facilitate DC to next venue of care. VSS during session.    Follow Up Recommendations  SNF;Supervision/Assistance - 24 hour    Equipment Recommendations  Other (comment)(TBA)    Recommendations for Other Services       Precautions / Restrictions Precautions Precautions: Fall Precaution Comments: skin breakdown Restrictions Weight Bearing Restrictions: No      Mobility Bed Mobility Overal bed mobility: Needs Assistance Bed Mobility: Supine to Sit;Sit to Supine     Supine to sit: Max assist Sit to supine: Mod assist      Transfers Overall transfer level: Needs assistance Equipment used: 2 person hand held assist Transfers: Sit to/from Stand;Stand Pivot Transfers Sit to Stand: Max assist Stand pivot transfers: Max assist;+2 safety/equipment       General transfer comment: unable to achieve full upright posture    Balance Overall balance assessment: Needs assistance Sitting-balance support: Bilateral upper extremity supported;Feet supported Sitting balance-Leahy Scale: Poor Sitting balance - Comments: posterior lean   Standing balance support: Bilateral upper extremity supported;During functional activity Standing balance-Leahy Scale: Poor Standing balance comment: posterior lean in standing                           ADL either performed or assessed with clinical judgement    ADL Overall ADL's : Needs assistance/impaired Eating/Feeding: Set up;Supervision/ safety;Sitting   Grooming: Set up;Supervision/safety;Sitting   Upper Body Bathing: Minimal assistance;Sitting;Bed level   Lower Body Bathing: Maximal assistance;Sit to/from stand   Upper Body Dressing : Moderate assistance   Lower Body Dressing: Maximal assistance   Toilet Transfer: +2 for physical assistance;Maximal assistance;BSC;Stand-pivot;Cueing for safety;Cueing for sequencing   Toileting- Clothing Manipulation and Hygiene: Total assistance Toileting - Clothing Manipulation Details (indicate cue type and reason): incontinenet of BM multiple times     Functional mobility during ADLs: Maximal assistance;+2 for physical assistance       Vision         Perception     Praxis      Pertinent Vitals/Pain Pain Assessment: Faces Faces Pain Scale: Hurts little more Pain Location: abdomen Pain Descriptors / Indicators: Discomfort;Grimacing Pain Intervention(s): Limited activity within patient's tolerance     Hand Dominance Right   Extremity/Trunk Assessment Upper Extremity Assessment Upper Extremity Assessment: Generalized weakness   Lower Extremity Assessment Lower Extremity Assessment: Defer to PT evaluation   Cervical / Trunk Assessment Cervical / Trunk Assessment: Kyphotic   Communication Communication Communication: HOH   Cognition Arousal/Alertness: Awake/alert Behavior During Therapy: WFL for tasks assessed/performed Overall Cognitive Status: No family/caregiver present to determine baseline cognitive functioning Area of Impairment: Memory;Safety/judgement                     Memory: Decreased short-term memory   Safety/Judgement: Decreased awareness of safety     General Comments: will further assess   General Comments  Exercises     Shoulder Instructions      Home Living Family/patient expects to be discharged to:: Skilled nursing  facility Living Arrangements: Children Available Help at Discharge: Available PRN/intermittently Type of Home: House Home Access: Ramped entrance     Home Layout: Able to live on main level with bedroom/bathroom               Home Equipment: Cane - single point;Wheelchair - manual          Prior Functioning/Environment Level of Independence: Independent with assistive device(s)        Comments: pt reports using cane prior to admission        OT Problem List: Decreased strength;Decreased activity tolerance;Impaired balance (sitting and/or standing);Decreased coordination;Decreased cognition;Decreased safety awareness;Decreased knowledge of use of DME or AE;Decreased knowledge of precautions;Cardiopulmonary status limiting activity;Pain      OT Treatment/Interventions: Self-care/ADL training;Therapeutic exercise;Energy conservation;DME and/or AE instruction;Therapeutic activities;Cognitive remediation/compensation;Patient/family education;Balance training    OT Goals(Current goals can be found in the care plan section) Acute Rehab OT Goals Patient Stated Goal: to get better and stop going to the bathroom OT Goal Formulation: With patient Time For Goal Achievement: 12/14/17 Potential to Achieve Goals: Good  OT Frequency: Min 2X/week   Barriers to D/C:            Co-evaluation              AM-PAC PT "6 Clicks" Daily Activity     Outcome Measure Help from another person eating meals?: A Little Help from another person taking care of personal grooming?: A Little Help from another person toileting, which includes using toliet, bedpan, or urinal?: Total Help from another person bathing (including washing, rinsing, drying)?: A Lot Help from another person to put on and taking off regular upper body clothing?: A Lot Help from another person to put on and taking off regular lower body clothing?: A Lot 6 Click Score: 13   End of Session Equipment Utilized During  Treatment: Gait belt Nurse Communication: Mobility status  Activity Tolerance: Patient tolerated treatment well Patient left: in bed;with call bell/phone within reach;with bed alarm set  OT Visit Diagnosis: Other abnormalities of gait and mobility (R26.89);Muscle weakness (generalized) (M62.81)                Time: 7867-5449 OT Time Calculation (min): 23 min Charges:  OT General Charges $OT Visit: 1 Visit OT Evaluation $OT Eval Moderate Complexity: 1 Mod OT Treatments $Self Care/Home Management : 8-22 mins G-Codes:     [facility], OT/L  [phone] 11/30/2017  Trenda Corliss,HILLARY 11/30/2017, 4:03 PM

## 2017-11-30 NOTE — Care Management Note (Signed)
Case Management Note  Patient Details  Name: Airik Goodlin MRN: [id number] Date of Birth: October 12, 1926  Subjective/Objective:                 Patient from home. Admitted and found to have sepsis secondary to strep viridans bacteremia suspect secondary to dental caries. Dentistry consulted.  CIR rec by PT, however CIR admission RN rec SNF or HH. Active w [facility]. Is also on Next Gen list for [facility] and has Medicare so [facility] may be a possibility as well at DC IF he needs more intense home therapy and would like to DC home. However patient more likely to need SNF.    Action/Plan:   Expected Discharge Date:  11/25/17               Expected Discharge Plan:  [place]  In-House Referral:     Discharge planning Services  CM Consult  Post Acute Care Choice:    Choice offered to:     DME Arranged:    DME Agency:     HH Arranged:    HH Agency:     Status of Service:  In process, will continue to follow  If discussed at Long Length of Stay Meetings, dates discussed:    Additional Comments:  Carles Collet, RN 11/30/2017, 3:08 PM

## 2017-11-30 NOTE — Clinical Social Work Note (Signed)
Clinical Social Work Assessment  Patient Details  Name: Peter Moreno MRN: [id number] Date of Birth: 1926-10-09  Date of referral:  11/30/17               Reason for consult:  Facility Placement                Permission sought to share information with:  Case Manager Permission granted to share information::  Yes, Release of Information Signed  Name::     [place]::  SNF  Relationship::  son  Contact Information:     Housing/Transportation Living arrangements for the past 2 months:  Single Family Home Source of Information:  Patient Patient Interpreter Needed:  None Criminal Activity/Legal Involvement Pertinent to Current Situation/Hospitalization:  No - Comment as needed Significant Relationships:  Adult Children, Other Family Members Lives with:  Adult Children Do you feel safe going back to the place where you live?  No Need for family participation in patient care:  Yes (Comment)  Care giving concerns:  CSW met with patient at bedside, pleasant gentlemen but unable to give me more information other than permission to call son. CSW explained that patient will need short term rehab however, since he was oriented to person only, CSW will need to discuss with son.   CSW called son and left message. CSW will f/u.  Social Worker assessment / plan:  CSW will contact son again, assist with SNF placement/process and disposition.  Employment status:  Retired Forensic scientist:  Medicare PT Recommendations:  [place] / Referral to community resources:  [place]  Patient/Family's Response to care:  Patient pleasant and thanked CSW for coming to meet at bedside. CSW will f/u with son.  Patient/Family's Understanding of and Emotional Response to Diagnosis, Current Treatment, and Prognosis:  Deferred at this time as patient only oriented to self. CSW will f/u with son.  Emotional Assessment Appearance:  Appears stated  age Attitude/Demeanor/Rapport:  (Pleasant) Affect (typically observed):  Accepting, Appropriate Orientation:  Oriented to Self Alcohol / Substance use:  Not Applicable Psych involvement (Current and /or in the community):  No (Comment)  Discharge Needs  Concerns to be addressed:  Discharge Planning Concerns Readmission within the last 30 days:  No Current discharge risk:  Physical Impairment Barriers to Discharge:  No Barriers Identified   Normajean Baxter, LCSW 11/30/2017, 5:17 PM

## 2017-11-30 NOTE — Consult Note (Signed)
DENTAL CONSULTATION  Date of Consultation:  11/30/2017 Patient Name:   Peter Moreno Date of Birth:   12-Apr-1927 Medical Record Number: [id number]  VITALS: BP 132/70 (BP Location: Left Arm)   Pulse 99   Temp 97.8 F (36.6 C) (Oral)   Resp (!) 22   Ht 5\' 6"  (1.676 m)   Wt 137 lb (62.1 kg)   SpO2 100%   BMI 22.11 kg/m   CHIEF COMPLAINT: Patient referred by Dr. Ree Kida for dental consultation.  HPI: Peter Moreno is an 84 year old male recently diagnosed with streptococcal bacteremia.  Dental consultation requested to evaluate poor dentition for dental etiology of the bacteremia.  The patient has a history of lower right quadrant toothaches.  Patient describes the pain as having intermittent sharp pain that lasts for seconds to minutes at a time.  Patient points to tooth #30 and #31 as the offending teeth.  Patient indicates that the pain has an intensity of 8 out of 10 but is currently 0 out of 10.  Patient is not sure how long the pain has been present.  Patient has not seen a dentist for over 35 years by report.  Patient has an upper complete denture and no lower partial denture.  Patient indicates that the upper complete denture that was made approximately 50 years ago "fits good" and is causing no problems.  Patient denies having dental phobia.  PROBLEM LIST: Patient Active Problem List   Diagnosis Date Noted  . Streptococcal bacteremia   . Tooth abscess   . AKI (acute kidney injury) ([place])   . UTI (urinary tract infection) 11/22/2017  . Sepsis ([place]) 11/22/2017  . Hypotension 11/22/2017  . Hyponatremia 11/22/2017  . Alzheimer's dementia 09/29/2017  . Murmur 04/12/2015  . Encounter for therapeutic drug monitoring 11/14/2013  . Long term (current) use of anticoagulants 07/06/2013  . Atrial fibrillation with RVR ([place]) 06/22/2013  . Essential hypertension 06/22/2013    PMH: Past Medical History:  Diagnosis Date  . Atrial fibrillation ([place])   . Hypertension   . Skin cancer      PSH: Past Surgical History:  Procedure Laterality Date  . Cataracts      ALLERGIES: Allergies  Allergen Reactions  . Adhesive [Tape] Other (See Comments)    THE PATIENT'S SKIN IS VERY THIN AND WILL TEAR AND BRUISE VERY EASILY!!  . Sunflower Oil Itching  . Penicillins Rash    Has patient had a PCN reaction causing immediate rash, facial/tongue/throat swelling, SOB or lightheadedness with hypotension: Yes Has patient had a PCN reaction causing severe rash involving mucus membranes or skin necrosis: Unk Has patient had a PCN reaction that required hospitalization: No Has patient had a PCN reaction occurring within the last 10 years: No If all of the above answers are "NO", then may proceed with Cephalosporin use.     MEDICATIONS: Current Facility-Administered Medications  Medication Dose Route Frequency Provider Last Rate Last Dose  . 0.9 %  sodium chloride infusion  1,000 mL Intravenous Continuous Cherene Altes, MD 75 mL/hr at 11/30/17 0658 1,000 mL at 11/30/17 0658  . acetaminophen (TYLENOL) tablet 650 mg  650 mg Oral Q6H PRN Ivor Costa, MD       Or  . acetaminophen (TYLENOL) suppository 650 mg  650 mg Rectal Q6H PRN Ivor Costa, MD      . allopurinol (ZYLOPRIM) tablet 300 mg  300 mg Oral Daily Ivor Costa, MD   300 mg at 11/30/17 6578  . cefTRIAXone (ROCEPHIN) 2 g in  sodium chloride 0.9 % 100 mL IVPB  2 g Intravenous Q24H Reginia Naas, [facility]   Stopped at 11/29/17 1410  . cholecalciferol (VITAMIN D) tablet 1,000 Units  1,000 Units Oral Daily Ivor Costa, MD   1,000 Units at 11/30/17 [phone]  . diltiazem (CARDIZEM CD) 24 hr capsule 120 mg  120 mg Oral Daily Cristal Ford, DO   120 mg at 11/30/17 6734  . donepezil (ARICEPT) tablet 10 mg  10 mg Oral QHS Ivor Costa, MD   10 mg at 11/29/17 2114  . erythromycin ophthalmic ointment 1 application  1 application Both Eyes Daily Ivor Costa, MD   1 application at 19/37/90 0815  . linaclotide (LINZESS) capsule 290 mcg  290 mcg Oral  QAC breakfast Ivor Costa, MD   290 mcg at 11/30/17 [phone]  . midodrine (PROAMATINE) tablet 10 mg  10 mg Oral TID WC Ivor Costa, MD   10 mg at 11/30/17 0814  . multivitamin with minerals tablet 1 tablet  1 tablet Oral Q breakfast Ivor Costa, MD   1 tablet at 11/30/17 [phone]  . ondansetron (ZOFRAN) tablet 4 mg  4 mg Oral Q6H PRN Ivor Costa, MD       Or  . ondansetron [facility]) injection 4 mg  4 mg Intravenous Q6H PRN Ivor Costa, MD      . polyethylene glycol (MIRALAX / GLYCOLAX) packet 17 g  17 g Oral Daily PRN Ivor Costa, MD      . sodium chloride flush (NS) 0.9 % injection 10-40 mL  10-40 mL Intracatheter Q12H Mikhail, Kress, DO   10 mL at 11/29/17 2159  . sodium chloride flush (NS) 0.9 % injection 10-40 mL  10-40 mL Intracatheter PRN Mikhail, Velta Addison, DO      . sodium chloride flush (NS) 0.9 % injection 10-40 mL  10-40 mL Intracatheter PRN Cristal Ford, DO      . tamsulosin [facility]) capsule 0.4 mg  0.4 mg Oral Daily Cristal Ford, DO   0.4 mg at 11/30/17 2992  . Warfarin - Pharmacist Dosing Inpatient   Does not apply q1800 Cherene Altes, MD      . zolpidem [facility]) tablet 5 mg  5 mg Oral QHS PRN Ivor Costa, MD   5 mg at 11/29/17 2114    LABS: Lab Results  Component Value Date   WBC 8.7 11/30/2017   HGB 9.4 (L) 11/30/2017   HCT 27.5 (L) 11/30/2017   MCV 95.2 11/30/2017   PLT 109 (L) 11/30/2017      Component Value Date/Time   NA 139 11/30/2017 0625   NA 142 05/25/2017 1629   K 3.6 11/30/2017 0625   CL 111 11/30/2017 0625   CO2 21 (L) 11/30/2017 0625   GLUCOSE 104 (H) 11/30/2017 0625   BUN 16 11/30/2017 0625   BUN 44 (H) 05/25/2017 1629   CREATININE 0.90 11/30/2017 0625   CREATININE 1.15 (H) 04/23/2016 0854   CALCIUM 8.3 (L) 11/30/2017 0625   GFRNONAA >60 11/30/2017 0625   GFRNONAA 59 (L) 04/10/2014 1542   GFRAA >60 11/30/2017 0625   GFRAA 68 04/10/2014 1542   Lab Results  Component Value Date   INR 2.57 11/30/2017   INR 2.37 11/29/2017   INR 2.60 11/28/2017    No results found for: PTT  SOCIAL HISTORY: Social History   Socioeconomic History  . Marital status: Widowed    Spouse name: Not on file  . Number of children: 3  . Years of education: Not on file  .  Highest education level: Not on file  Social Needs  . Financial resource strain: Not on file  . Food insecurity - worry: Not on file  . Food insecurity - inability: Not on file  . Transportation needs - medical: Not on file  . Transportation needs - non-medical: Not on file  Occupational History  . Not on file  Tobacco Use  . Smoking status: Former Smoker    Packs/day: 3.00    Types: Cigarettes    Start date: 06/22/1949    Last attempt to quit: 06/22/1969    Years since quitting: 48.4  . Smokeless tobacco: Never Used  Substance and Sexual Activity  . Alcohol use: Yes    Comment: Occasional  . Drug use: Not on file  . Sexual activity: Not on file  Other Topics Concern  . Not on file  Social History Narrative  . Not on file    FAMILY HISTORY: Family History  Problem Relation Age of Onset  . Dementia Mother   . Heart disease Unknown        No family history    REVIEW OF SYSTEMS: Reviewed with the patient as per History of present illness. Psych: Patient denies having dental phobia.  DENTAL HISTORY: CHIEF COMPLAINT: Patient referred by Dr. Ree Kida for dental consultation.  HPI: Peter Moreno is an 84 year old male recently diagnosed with streptococcal bacteremia.  Dental consultation requested to evaluate poor dentition for dental etiology of the bacteremia.  The patient has a history of lower right quadrant toothaches.  Patient describes the pain as having intermittent sharp pain that lasts for seconds to minutes at a time.  Patient points to tooth #30 and #31 as the offending teeth.  Patient indicates that the pain has an intensity of 8 out of 10 but is currently 0 out of 10.  Patient is not sure how long the pain has been present.  Patient has not seen a dentist  for over 35 years by report.  Patient has an upper complete denture and no lower partial denture.  Patient indicates that the upper complete denture that was made approximately 50 years ago "fits good" and is causing no problems.  Patient denies having dental phobia.   DENTAL EXAMINATION: GENERAL: The patient is a well-developed, slightly built male in no acute distress. HEAD AND NECK: There is no palpable neck lymphadenopathy.  The patient denies acute TMJ symptoms. INTRAORAL EXAM: Patient has xerostomia.  There is no evidence of an intraoral abscess. DENTITION: Patient is missing tooth numbers 1 through 16, 17 through 21, 29, and 32. PERIODONTAL: The patient has chronic periodontitis with plaque calculus accumulations, gingival recession, and incipient tooth mobility. DENTAL CARIES/SUBOPTIMAL RESTORATIONS: Dental caries are noted tooth #31. ENDODONTIC: Patient has a history of acute pulpitis symptoms.  Patient has periapical pathology and radiolucency associated with apices of tooth numbers 30 and 31. CROWN AND BRIDGE: Tooth numbers 30 and 30 one half PFM crowns. PROSTHODONTIC: The patient has an upper complete denture that the patient indicates "fits good".  He denies having lower partial denture.  Patient indicates that the upper complete denture was fabricated over 35 years ago. OCCLUSION: The patient has a poor occlusal scheme secondary to multiple missing teeth and lack replacement of all missing teeth with clinically acceptable dental prostheses.Marland Kitchen  RADIOGRAPHIC INTERPRETATION: An orthopantogram was taken to 11/26/2017. There are multiple missing teeth.  There is a periapical radiolucency at the apices of tooth numbers 30 and 31.  Patient has incipient a moderate bone loss.  Dental caries are noted.  The patient wore his upper complete denture with porcelain teeth during the taking of the orthopantogram.  The orthopantogram was suboptimal secondary to significant patient movement and  radiographic artifact.   ASSESSMENTS: 1.  Streptococcal bacteremia 2.  Chronic apical periodontitis 3.  History of acute pulpitis 4.  Dental caries 5.  Chronic periodontitis of bone loss 6.  Accretions 7.  Gingival recession 8.  Multiple missing teeth 9.  History of upper complete denture and no lower partial dentures 10.  Poor occlusal scheme 11.  Risk of bleeding with invasive dental procedures due to current warfarin therapy 12.  DNR status with need to assess wishes of the patient and family concerning provision of dental treatment with significant potential for complications at this time.  PLAN/RECOMMENDATIONS: 1. I discussed the risks, benefits, and complications of various treatment options with the patient in relationship to his medical and dental conditions, streptococcal bacteremia, and warfarin therapy with risk for bleeding. We discussed various treatment options to include no treatment, total and subtotal extractions with alveoloplasty, pre-prosthetic surgery as indicated, periodontal therapy, dental restorations, root canal therapy, crown and bridge therapy, implant therapy, and replacement of missing teeth as indicated.  We also discussed referral to an oral surgeon for extraction procedures as an outpatient.  The patient currently wishes to think about having extraction of tooth #30 and 31 with alveoloplasty either as an inpatient or an outpatient.  The warfarin therapy will need to be discontinued prior to the invasive dental procedures. I have contacted Dr. Ree Kida and she will discuss the treatment options with the patient and family and the decision in relationship to his current DNR status.  Dr. Maye Hides will then contact dental medicine as indicated and I will coordinate dental treatment either as an inpatient with dental medicine for next week or as an outpatient with the oral surgeon.  In the meantime the patient will continue on IV antibiotic therapy as prescribed by the  medical team.  2. Discussion of findings with medical team and coordination of future medical and dental care as needed.     Lenn Cal, DDS

## 2017-11-30 NOTE — Progress Notes (Signed)
PROGRESS NOTE    Peter Moreno  VEL:[id number] DOB: 1927/08/05 DOA: 11/22/2017 PCP: Cyndy Freeze, MD   Chief Complaint  Patient presents with  . Weakness    Brief Narrative:  HPI On 11/22/2017 by Dr. Ivor Costa Peter Moreno is a 84 y.o. male with medical history significant of hypertension, atrial fibrillation on Coumadin, skin cancer, dementia, gout, who presents with difficulty urinating and urge for urination.  Per his son, patient has been having generalized weakness in the past 6 weeks. His son states that the patient has generalized weakness, but no unilateral weakness or numbness in extremities. Patient seems to have mild right facial droop, but his son states that is normal to patient due to missing teeth. In the past several days, patient has been having difficult urinating. He has urge for urination all the time. He does not seem to have dysuria per his son. Patient does not have chest pain, SOB or cough. His son states that his blood pressure has been running low in the past 6 weeks. He was seen in the ED of [place] and was started with Midodrine, without clear diagnosis. Patient does not have nausea, vomiting or diarrhea. Pt had one or 2 episodes of confusion recently, but not today. Pt was found to have urinary retention and hematuria in ED. Foley cath was placed.  Interim history Admitted and found to have sepsis secondary to strep viridans bacteremia suspect secondary to dental caries. Dentistry consulted.   Assessment & Plan   Sepsis secondary strep bacteremia -Patient presented with leukocytosis, hypotension and was febrile -leukocytosis and fever have resolved -UA: WBC TNTC, no bacteria seen, moderate leukocytes -Urine culture show no growth -Blood culture 11/22/2017: 2/2 strep viridans -blood culture from 11/24/2017 strep viridans -Initially placed on aztreonam due to penicillin allergy as well as vancomycin -transitioned to ceftriaxone -Given that he has persistent  bacteremia, infectious disease consulted and appreciated -Echocardiogram shows EF 65-70%, calcified aortic valve with moderate   AS and trace AI; mild MR; biatrial enlargement; mild RVE; mild TR wiith mild pulmonary hypertension -Given that patient's age and mild dementia, will forgo obtaining TEE and continue antibiotics for 28 days from day of negative blood cultures. Discussed with patient's son, agreed to forgo TEE.  -repeat blood cultures on 2/22 and show no growth to date -obtained CT head: Microvascular ischemic disease, advanced atrophy without superimposed acute intracranial process -Orthopantogram showed periapical lucency involving both main right mandibular molars and left mandibular premolar. -PICC line placed on 2/24 -Dentistry, Dr. Enrique Sack, consulted and appreciated. Discussed with family, would like to proceed with tooth extractions. Per Dr. Enrique Sack, surgery possibly on 3/4 -will discontinue coumadin this evening and start on heparin once INR <2  Hypotension -resolved -Per family has been ongoing for approximately 6 weeks -Patient was recently started on midodrine -Etiology unknown -Was given dose of Solu-Cortef upon admission -BP does appear to be improved  Acute kidney injury on chronic kidney disease, stage III -resolved -Creatinine on admission 1.50, baseline approximately 1.2 -Currently 0.90 -Continue to monitor BMP  Atrial fibrillation -CHADSVASC 3 -INR 2.57 -Continue cardizem -As above, given that patient's family would like to proceed with surgery, will hold Coumadin and start patient on heparin when INR is less than 2  Urinary retention -Foley catheter placed  -Continue flomax -will likely need outpatient urology follow up  Essential hypertension -As noted above, patient has been hypotensive -lotensin held  Dementia -Continue donepezil   Hyponatremia -resolved, continue to monitor BMP  Aortic stenosis -Noted on  echocardiogram in July 2017, mild    Deconditioning -Likely secondary to the above processes as well as prolonged hospital stay -PT and OT consulted and appreciated -Suspect patient will likely need SNF  DVT Prophylaxis Coumadin  Code Status: DNR  Family Communication: None at bedside.  Son via phone  Disposition Plan: Admitted.  Pending possible dental extractions next week.  SNF upon discharge.  Consultants Infectious disease Dentistry   Procedures  Echocardiogram Orthopantogram PICC line placement  Antibiotics   Anti-infectives (From admission, onward)   Start     Dose/Rate Route Frequency Ordered Stop   11/24/17 2200  vancomycin (VANCOCIN) IVPB 750 mg/150 ml premix  Status:  Discontinued     750 mg 150 mL/hr over 60 Minutes Intravenous Every 24 hours 11/23/17 2101 11/23/17 2105   11/24/17 2000  levofloxacin (LEVAQUIN) IVPB 750 mg  Status:  Discontinued     750 mg 100 mL/hr over 90 Minutes Intravenous Every 48 hours 11/22/17 2136 11/23/17 0038   11/24/17 1200  cefTRIAXone (ROCEPHIN) 2 g in sodium chloride 0.9 % 100 mL IVPB     2 g 200 mL/hr over 30 Minutes Intravenous Every 24 hours 11/24/17 1031     11/23/17 2200  vancomycin (VANCOCIN) IVPB 750 mg/150 ml premix  Status:  Discontinued     750 mg 150 mL/hr over 60 Minutes Intravenous Every 24 hours 11/22/17 2136 11/23/17 0038   11/23/17 2200  vancomycin (VANCOCIN) IVPB 750 mg/150 ml premix  Status:  Discontinued     750 mg 150 mL/hr over 60 Minutes Intravenous Every 24 hours 11/23/17 2105 11/24/17 1031   11/23/17 2115  vancomycin (VANCOCIN) IVPB 1000 mg/200 mL premix  Status:  Discontinued     1,000 mg 200 mL/hr over 60 Minutes Intravenous  Once 11/23/17 2101 11/23/17 2105   11/23/17 0600  aztreonam (AZACTAM) 1 g in sodium chloride 0.9 % 100 mL IVPB  Status:  Discontinued     1 g 200 mL/hr over 30 Minutes Intravenous Every 8 hours 11/22/17 2136 11/24/17 1031   11/22/17 2000  levofloxacin (LEVAQUIN) IVPB 750 mg     750 mg 100 mL/hr over 90 Minutes  Intravenous  Once 11/22/17 1958 11/22/17 2315   11/22/17 2000  aztreonam (AZACTAM) 2 g in sodium chloride 0.9 % 100 mL IVPB     2 g 200 mL/hr over 30 Minutes Intravenous  Once 11/22/17 1958 11/23/17 0022   11/22/17 2000  vancomycin (VANCOCIN) IVPB 1000 mg/200 mL premix     1,000 mg 200 mL/hr over 60 Minutes Intravenous  Once 11/22/17 1958 11/23/17 0139      Subjective:   Peter Moreno seen and examined today.  Denies current chest pain, shortness of breath, abdominal pain, nausea vomiting, diarrhea or constipation.  Is looking for where his parents are.  Objective:   Vitals:   11/29/17 0347 11/29/17 1358 11/29/17 2013 11/30/17 0420  BP:  130/83 (!) 135/98 132/70  Pulse:  95 (!) 109 99  Resp:  18 17 (!) 22  Temp: 98.6 F (37 C) (!) 97.4 F (36.3 C) 98.6 F (37 C) 97.8 F (36.6 C)  TempSrc: Oral Oral Oral Oral  SpO2:  100% 100% 100%  Weight:      Height:        Intake/Output Summary (Last 24 hours) at 11/30/2017 1220 Last data filed at 11/30/2017 0420 Gross per 24 hour  Intake 250 ml  Output 1325 ml  Net -1075 ml   Filed Weights   11/22/17 1945 11/23/17  9892  Weight: 62.1 kg (137 lb) 62.1 kg (137 lb)   Exam  General: Well developed, well nourished, thin, NAD  HEENT: NCAT, mucous membranes moist.   Neck: Supple  Cardiovascular: S1 S2 auscultated, 2/6SEM, irregular  Respiratory: Clear to auscultation bilaterally with equal chest rise  Abdomen: Soft, nontender, nondistended, + bowel sounds  Extremities: warm dry without cyanosis clubbing or edema  Neuro: AAOx2, nonfocal  Psych: Normal affect and demeanor  Data Reviewed: I have personally reviewed following labs and imaging studies  CBC: Recent Labs  Lab 11/26/17 0328 11/27/17 0224 11/28/17 0219 11/29/17 0221 11/30/17 0625  WBC 9.1 8.3 8.3 7.0 8.7  HGB 10.4* 10.1* 10.8* 10.4* 9.4*  HCT 30.5* 30.5* 31.7* 30.4* 27.5*  MCV 93.3 94.4 94.6 95.3 95.2  PLT 131* 130* 139* 126* 119*   Basic Metabolic  Panel: Recent Labs  Lab 11/26/17 0328 11/27/17 0224 11/28/17 0219 11/29/17 0221 11/30/17 0625  NA 137 136 138 139 139  K 3.7 3.7 3.9 3.9 3.6  CL 109 109 109 109 111  CO2 21* 21* 21* 22 21*  GLUCOSE 101* 97 99 96 104*  BUN 23* 19 22* 17 16  CREATININE 0.98 0.94 0.94 0.96 0.90  CALCIUM 8.3* 8.2* 8.4* 8.3* 8.3*   GFR: Estimated Creatinine Clearance: 49.8 mL/min (by C-G formula based on SCr of 0.9 mg/dL). Liver Function Tests: No results for input(s): AST, ALT, ALKPHOS, BILITOT, PROT, ALBUMIN in the last 168 hours. No results for input(s): LIPASE, AMYLASE in the last 168 hours. No results for input(s): AMMONIA in the last 168 hours. Coagulation Profile: Recent Labs  Lab 11/26/17 0328 11/27/17 0224 11/28/17 0219 11/29/17 0221 11/30/17 0625  INR 2.14 2.47 2.60 2.37 2.57   Cardiac Enzymes: No results for input(s): CKTOTAL, CKMB, CKMBINDEX, TROPONINI in the last 168 hours. BNP (last 3 results) No results for input(s): PROBNP in the last 8760 hours. HbA1C: No results for input(s): HGBA1C in the last 72 hours. CBG: Recent Labs  Lab 11/25/17 1623 11/26/17 1110 11/26/17 1611  GLUCAP 119* 104* 118*   Lipid Profile: No results for input(s): CHOL, HDL, LDLCALC, TRIG, CHOLHDL, LDLDIRECT in the last 72 hours. Thyroid Function Tests: No results for input(s): TSH, T4TOTAL, FREET4, T3FREE, THYROIDAB in the last 72 hours. Anemia Panel: No results for input(s): VITAMINB12, FOLATE, FERRITIN, TIBC, IRON, RETICCTPCT in the last 72 hours. Urine analysis:    Component Value Date/Time   COLORURINE YELLOW 11/22/2017 2227   APPEARANCEUR HAZY (A) 11/22/2017 2227   LABSPEC 1.011 11/22/2017 2227   PHURINE 6.0 11/22/2017 2227   GLUCOSEU NEGATIVE 11/22/2017 2227   HGBUR LARGE (A) 11/22/2017 2227   BILIRUBINUR NEGATIVE 11/22/2017 2227   KETONESUR NEGATIVE 11/22/2017 2227   PROTEINUR NEGATIVE 11/22/2017 2227   NITRITE NEGATIVE 11/22/2017 2227   LEUKOCYTESUR MODERATE (A) 11/22/2017 2227     Sepsis Labs: @LABRCNTIP (procalcitonin:4,lacticidven:4)  ) Recent Results (from the past 240 hour(s))  Culture, blood (Routine x 2)     Status: Abnormal   Collection Time: 11/22/17  8:00 PM  Result Value Ref Range Status   Specimen Description BLOOD LEFT ANTECUBITAL  Final   Special Requests   Final    BOTTLES DRAWN AEROBIC AND ANAEROBIC Blood Culture results may not be optimal due to an excessive volume of blood received in culture bottles   Culture  Setup Time   Final    GRAM POSITIVE COCCI IN BOTH AEROBIC AND ANAEROBIC BOTTLES CRITICAL RESULT CALLED TO, READ BACK BY AND VERIFIED WITH: L CURRAN PHARMD  1636 11/23/17 A BROWNING Performed at [facility] [address], [place], [place] [address]    Culture VIRIDANS STREPTOCOCCUS (A)  Final   Report Status 11/25/2017 FINAL  Final   Organism ID, Bacteria VIRIDANS STREPTOCOCCUS  Final      Susceptibility   Viridans streptococcus - MIC*    PENICILLIN <=0.06 SENSITIVE Sensitive     CEFTRIAXONE <=0.12 SENSITIVE Sensitive     ERYTHROMYCIN <=0.12 SENSITIVE Sensitive     LEVOFLOXACIN 0.5 SENSITIVE Sensitive     VANCOMYCIN 0.5 SENSITIVE Sensitive     * VIRIDANS STREPTOCOCCUS  Blood Culture ID Panel (Reflexed)     Status: Abnormal   Collection Time: 11/22/17  8:00 PM  Result Value Ref Range Status   Enterococcus species NOT DETECTED NOT DETECTED Final   Listeria monocytogenes NOT DETECTED NOT DETECTED Final   Staphylococcus species NOT DETECTED NOT DETECTED Final   Staphylococcus aureus NOT DETECTED NOT DETECTED Final   Streptococcus species DETECTED (A) NOT DETECTED Final    Comment: Not Enterococcus species, Streptococcus agalactiae, Streptococcus pyogenes, or Streptococcus pneumoniae. CRITICAL RESULT CALLED TO, READ BACK BY AND VERIFIED WITH: Bronwen Betters PHARMD [id number] 11/23/17 A BROWNING    Streptococcus agalactiae NOT DETECTED NOT DETECTED Final   Streptococcus pneumoniae NOT DETECTED NOT DETECTED Final   Streptococcus pyogenes  NOT DETECTED NOT DETECTED Final   Acinetobacter baumannii NOT DETECTED NOT DETECTED Final   Enterobacteriaceae species NOT DETECTED NOT DETECTED Final   Enterobacter cloacae complex NOT DETECTED NOT DETECTED Final   Escherichia coli NOT DETECTED NOT DETECTED Final   Klebsiella oxytoca NOT DETECTED NOT DETECTED Final   Klebsiella pneumoniae NOT DETECTED NOT DETECTED Final   Proteus species NOT DETECTED NOT DETECTED Final   Serratia marcescens NOT DETECTED NOT DETECTED Final   Haemophilus influenzae NOT DETECTED NOT DETECTED Final   Neisseria meningitidis NOT DETECTED NOT DETECTED Final   Pseudomonas aeruginosa NOT DETECTED NOT DETECTED Final   Candida albicans NOT DETECTED NOT DETECTED Final   Candida glabrata NOT DETECTED NOT DETECTED Final   Candida krusei NOT DETECTED NOT DETECTED Final   Candida parapsilosis NOT DETECTED NOT DETECTED Final   Candida tropicalis NOT DETECTED NOT DETECTED Final    Comment: Performed at [facility], [place]. [address] [place], [place] [address]  Culture, blood (Routine x 2)     Status: Abnormal   Collection Time: 11/22/17  8:02 PM  Result Value Ref Range Status   Specimen Description BLOOD RIGHT ANTECUBITAL  Final   Special Requests   Final    BOTTLES DRAWN AEROBIC AND ANAEROBIC Blood Culture adequate volume   Culture  Setup Time   Final    GRAM POSITIVE COCCI IN BOTH AEROBIC AND ANAEROBIC BOTTLES CRITICAL RESULT CALLED TO, READ BACK BY AND VERIFIED WITH: Bronwen Betters PHARMD 1636 11/23/17 A BROWNING    Culture (A)  Final    VIRIDANS STREPTOCOCCUS SUSCEPTIBILITIES PERFORMED ON PREVIOUS CULTURE WITHIN THE LAST 5 DAYS. Performed at [facility], [place] [address], [place], [place] [address]    Report Status 11/25/2017 FINAL  Final  Urine culture     Status: None   Collection Time: 11/22/17  8:36 PM  Result Value Ref Range Status   Specimen Description URINE, CATHETERIZED  Final   Special Requests NONE  Final   Culture   Final    NO  GROWTH Performed at [facility], [place] [address], [place],  [address]    Report Status 11/24/2017 FINAL  Final  Culture, blood (routine x 2)     Status: None   Collection Time: 11/24/17  3:47 PM  Result Value Ref Range Status   Specimen Description BLOOD LEFT ANTECUBITAL  Final   Special Requests IN PEDIATRIC BOTTLE Blood Culture adequate volume  Final   Culture   Final    NO GROWTH 5 DAYS Performed at [facility], [place] [address] [place], [place] [address]    Report Status 11/29/2017 FINAL  Final  Culture, blood (routine x 2)     Status: Abnormal   Collection Time: 11/24/17  3:53 PM  Result Value Ref Range Status   Specimen Description BLOOD LEFT HAND  Final   Special Requests IN PEDIATRIC BOTTLE Blood Culture adequate volume  Final   Culture  Setup Time   Final    GRAM POSITIVE COCCI IN PEDIATRIC BOTTLE CRITICAL VALUE NOTED.  VALUE IS CONSISTENT WITH PREVIOUSLY REPORTED AND CALLED VALUE.    Culture (A)  Final    VIRIDANS STREPTOCOCCUS SUSCEPTIBILITIES PERFORMED ON PREVIOUS CULTURE WITHIN THE LAST 5 DAYS. Performed at [facility], [place] [address] [place], [place] [address]    Report Status 11/27/2017 FINAL  Final  Culture, blood (routine x 2)     Status: None (Preliminary result)   Collection Time: 11/26/17 10:49 AM  Result Value Ref Range Status   Specimen Description BLOOD RIGHT ANTECUBITAL  Final   Special Requests   Final    BOTTLES DRAWN AEROBIC AND ANAEROBIC Blood Culture adequate volume   Culture   Final    NO GROWTH 3 DAYS Performed at [facility], [place] [address], [place], [place] [address]    Report Status PENDING  Incomplete  Culture, blood (routine x 2)     Status: None (Preliminary result)   Collection Time: 11/26/17 10:55 AM  Result Value Ref Range Status   Specimen Description BLOOD LEFT ANTECUBITAL  Final   Special Requests   Final    BOTTLES DRAWN AEROBIC AND ANAEROBIC Blood Culture results may not be optimal due to an  excessive volume of blood received in culture bottles   Culture   Final    NO GROWTH 3 DAYS Performed at [facility], [place] [address] [place], [place] [address]    Report Status PENDING  Incomplete      Radiology Studies: Dg Chest Port 1 View  Result Date: 11/28/2017 CLINICAL DATA:  Evaluate for PICC line placement EXAM: PORTABLE CHEST 1 VIEW COMPARISON:  11/22/2017 FINDINGS: Right PICC tip projects in the lower superior vena cava at the level of the caval atrial junction. There is lung base opacity that has increased when compared to the prior study, more notable on the right. This is likely a combination of small effusions with either atelectasis or infiltrate. There is no pulmonary edema. Heart is top-normal in size.  No mediastinal or hilar masses. No pneumothorax. IMPRESSION: 1. Right PICC tip projects in the lower superior vena cava at the level of the caval atrial junction. 2. Increased lung base opacity since prior study more notable on the right. This is likely combination of small effusions with atelectasis or pneumonia. No evidence of pulmonary edema. Electronically Signed   By: Lajean Manes M.D.   On: 11/28/2017 18:22     Scheduled Meds: . allopurinol  300 mg Oral Daily  . cholecalciferol  1,000 Units Oral Daily  . diltiazem  120 mg Oral Daily  . donepezil  10 mg Oral  QHS  . erythromycin  1 application Both Eyes Daily  . linaclotide  290 mcg Oral QAC breakfast  . midodrine  10 mg Oral TID WC  . multivitamin with minerals  1 tablet Oral Q breakfast  . sodium chloride flush  10-40 mL Intracatheter Q12H  . tamsulosin  0.4 mg Oral Daily  . Warfarin - Pharmacist Dosing Inpatient   Does not apply q1800   Continuous Infusions: . sodium chloride 1,000 mL (11/30/17 0658)  . cefTRIAXone (ROCEPHIN)  IV Stopped (11/29/17 1410)     LOS: 7 days   Time Spent in minutes   30 minutes  Elwanda Moger D.O. on 11/30/2017 at 12:20 PM  Between 7am to 7pm - Pager -  [phone]  After 7pm go to [email or web address] - password [email or web address]  And look for the night coverage person covering for me after hours  [facility] Office  [phone]

## 2017-12-01 ENCOUNTER — Inpatient Hospital Stay (HOSPITAL_COMMUNITY): Payer: Medicare Other

## 2017-12-01 DIAGNOSIS — G309 Alzheimer's disease, unspecified: Secondary | ICD-10-CM

## 2017-12-01 LAB — URINALYSIS, ROUTINE W REFLEX MICROSCOPIC
Bilirubin Urine: NEGATIVE
GLUCOSE, UA: NEGATIVE mg/dL
Ketones, ur: 5 mg/dL — AB
NITRITE: NEGATIVE
PH: 5 (ref 5.0–8.0)
Protein, ur: 100 mg/dL — AB
Specific Gravity, Urine: 1.023 (ref 1.005–1.030)

## 2017-12-01 LAB — BASIC METABOLIC PANEL
Anion gap: 7 (ref 5–15)
BUN: 19 mg/dL (ref 6–20)
CO2: 21 mmol/L — ABNORMAL LOW (ref 22–32)
Calcium: 8.1 mg/dL — ABNORMAL LOW (ref 8.9–10.3)
Chloride: 110 mmol/L (ref 101–111)
Creatinine, Ser: 0.95 mg/dL (ref 0.61–1.24)
Glucose, Bld: 94 mg/dL (ref 65–99)
Potassium: 3.5 mmol/L (ref 3.5–5.1)
SODIUM: 138 mmol/L (ref 135–145)

## 2017-12-01 LAB — CBC
HEMATOCRIT: 28.6 % — AB (ref 39.0–52.0)
Hemoglobin: 10 g/dL — ABNORMAL LOW (ref 13.0–17.0)
MCH: 33.4 pg (ref 26.0–34.0)
MCHC: 35 g/dL (ref 30.0–36.0)
MCV: 95.7 fL (ref 78.0–100.0)
PLATELETS: 125 10*3/uL — AB (ref 150–400)
RBC: 2.99 MIL/uL — AB (ref 4.22–5.81)
RDW: 15.9 % — AB (ref 11.5–15.5)
WBC: 17.5 10*3/uL — AB (ref 4.0–10.5)

## 2017-12-01 LAB — CULTURE, BLOOD (ROUTINE X 2)
Culture: NO GROWTH
Culture: NO GROWTH
Special Requests: ADEQUATE

## 2017-12-01 LAB — PROTIME-INR
INR: 2.85
Prothrombin Time: 29.7 seconds — ABNORMAL HIGH (ref 11.4–15.2)

## 2017-12-01 LAB — C DIFFICILE QUICK SCREEN W PCR REFLEX
C DIFFICILE (CDIFF) INTERP: DETECTED
C DIFFICILE (CDIFF) TOXIN: POSITIVE — AB
C Diff antigen: POSITIVE — AB

## 2017-12-01 MED ORDER — VANCOMYCIN 50 MG/ML ORAL SOLUTION
125.0000 mg | Freq: Four times a day (QID) | ORAL | Status: DC
  Administered 2017-12-01 – 2017-12-07 (×26): 125 mg via ORAL
  Filled 2017-12-01 (×32): qty 2.5

## 2017-12-01 NOTE — Progress Notes (Signed)
Physical Therapy Treatment Patient Details Name: Peter Moreno MRN: [id number] DOB: 1927/05/16 Today's Date: 12/01/2017    History of Present Illness Pt is a 84 y/o male admitted 11/22/17 with progressive weakness and confusion; found to have UTI with sepsis. Plan for potential tooth extraction surgery on 3/4. PMH significant for skin CA, HTN, a-fib, dementia.    PT Comments    Pt performed decreased activity and functional mobility.  He fatigues quickly and due to decline will change recommendations to SNF for a longer recovery process.  Pt remains motivated and able to follow commands.  Pt returned to supine as he is having bowel complications today.  Will inform supervising PT of change in recommendations based on functional decline.  Pt is agreeable to SNF for rehab post d/c from hospital.  PLan next session to progress mobility to tolerance.    Follow Up Recommendations  Supervision/Assistance - 24 hour;SNF     Equipment Recommendations  None recommended by PT    Recommendations for Other Services Rehab consult;OT consult     Precautions / Restrictions Precautions Precautions: Fall Precaution Comments: skin breakdown Restrictions Weight Bearing Restrictions: No    Mobility  Bed Mobility Overal bed mobility: Needs Assistance Bed Mobility: Rolling Rolling: Min assist   Supine to sit: Max assist Sit to supine: Max assist   General bed mobility comments: Pt required assistance to elevate trunk into sitting and to lift LE back into bed.  Pt able to sit edge of bed x 15 min.  Presents with increased WOB but unsure if waveform is accurate around 15 min O2 sats decreased to 79%.  Returned to bed and appear greater than 90 %.    Transfers                 General transfer comment: Pt with bowel complications today so deferred standing at this time.    Ambulation/Gait                 Stairs            Wheelchair Mobility    Modified Rankin (Stroke  Patients Only)       Balance Overall balance assessment: Needs assistance Sitting-balance support: Bilateral upper extremity supported;Feet supported Sitting balance-Leahy Scale: Poor Sitting balance - Comments: posterior lean, LOB when moving B LEs during seated exercises.                                      Cognition Arousal/Alertness: Awake/alert Behavior During Therapy: WFL for tasks assessed/performed Overall Cognitive Status: Difficult to assess                                        Exercises General Exercises - Lower Extremity Long Arc Quad: AROM;Both;10 reps;Seated Hip Flexion/Marching: AROM;Both;10 reps;Seated    General Comments        Pertinent Vitals/Pain Pain Assessment: Faces Faces Pain Scale: Hurts little more Pain Location: abdomen Pain Descriptors / Indicators: Discomfort;Grimacing Pain Intervention(s): Monitored during session;Repositioned    Home Living                      Prior Function            PT Goals (current goals can now be found in the care plan section) Acute Rehab PT Goals  Patient Stated Goal: to get better and stop going to the bathroom Potential to Achieve Goals: Fair Progress towards PT goals: Progressing toward goals    Frequency    Min 3X/week      PT Plan Current plan remains appropriate    Co-evaluation              AM-PAC PT "6 Clicks" Daily Activity  Outcome Measure  Difficulty turning over in bed (including adjusting bedclothes, sheets and blankets)?: Unable Difficulty moving from lying on back to sitting on the side of the bed? : Unable Difficulty sitting down on and standing up from a chair with arms (e.g., wheelchair, bedside commode, etc,.)?: Unable Help needed moving to and from a bed to chair (including a wheelchair)?: A Lot Help needed walking in hospital room?: Total Help needed climbing 3-5 steps with a railing? : Total 6 Click Score: 7    End of  Session Equipment Utilized During Treatment: Gait belt Activity Tolerance: Patient tolerated treatment well Patient left: with call bell/phone within reach;with family/visitor present;in bed Nurse Communication: Mobility status(informed nursing of skin on L arm.  ) PT Visit Diagnosis: Unsteadiness on feet (R26.81);Muscle weakness (generalized) (M62.81)     Time: 8469-6295 PT Time Calculation (min) (ACUTE ONLY): 33 min  Charges:  $Therapeutic Activity: 23-37 mins                    G Codes:       Peter Moreno, PTA pager [phone]    Peter Moreno 12/01/2017, 4:22 PM

## 2017-12-01 NOTE — Progress Notes (Signed)
Pt transferred to 5W20. Pt made comfortable, telemetry placed, and callbell  With reach. Will continue to assess.

## 2017-12-01 NOTE — Progress Notes (Signed)
PROGRESS NOTE    Peter Moreno  GUR:[id number] DOB: [birth year] DOA: 11/22/2017 PCP: Cyndy Freeze, MD   Chief Complaint  Patient presents with  . Weakness    Brief Narrative:  HPI On 11/22/2017 by Dr. Ivor Costa Peter Moreno is a 84 y.o. male with medical history significant of hypertension, atrial fibrillation on Coumadin, skin cancer, dementia, gout, who presents with difficulty urinating and urge for urination.  Per his son, patient has been having generalized weakness in the past 6 weeks. His son states that the patient has generalized weakness, but no unilateral weakness or numbness in extremities. Patient seems to have mild right facial droop, but his son states that is normal to patient due to missing teeth. In the past several days, patient has been having difficult urinating. He has urge for urination all the time. He does not seem to have dysuria per his son. Patient does not have chest pain, SOB or cough. His son states that his blood pressure has been running low in the past 6 weeks. He was seen in the ED of [place] and was started with Midodrine, without clear diagnosis. Patient does not have nausea, vomiting or diarrhea. Pt had one or 2 episodes of confusion recently, but not today. Pt was found to have urinary retention and hematuria in ED. Foley cath was placed.  Interim history Admitted and found to have sepsis secondary to strep viridans bacteremia suspect secondary to dental caries. Dentistry consulted, infectious infection next Monday.  Patient developed fever with leukocytosis overnight, pending repeat blood cultures, UA, chest x-ray.  Assessment & Plan   Sepsis secondary strep bacteremia -Patient presented with leukocytosis, hypotension and was febrile -leukocytosis and fever have resolved -UA: WBC TNTC, no bacteria seen, moderate leukocytes -Urine culture show no growth -Blood culture 11/22/2017: 2/2 strep viridans -blood culture from 11/24/2017 strep  viridans -Initially placed on aztreonam due to penicillin allergy as well as vancomycin -transitioned to ceftriaxone -Given that he has persistent bacteremia, infectious disease consulted and appreciated -Echocardiogram shows EF 65-70%, calcified aortic valve with moderate   AS and trace AI; mild MR; biatrial enlargement; mild RVE; mild TR wiith mild pulmonary hypertension -Given that patient's age and mild dementia, will forgo obtaining TEE and continue antibiotics for 28 days from day of negative blood cultures. Discussed with patient's son, agreed to forgo TEE.  -repeat blood cultures on 2/22 and show no growth to date -obtained CT head: Microvascular ischemic disease, advanced atrophy without superimposed acute intracranial process -Orthopantogram showed periapical lucency involving both main right mandibular molars and left mandibular premolar. -PICC line placed on 2/24 -Dentistry, Dr. Enrique Sack, consulted and appreciated. Discussed with family, would like to proceed with tooth extractions. Per Dr. Enrique Sack, surgery possibly on 3/4 -Coumadin discontinued on 11/30/2017, start on heparin once INR <2 -Overnight, patient developed fever as well as increased leukocytosis this morning, will obtain repeat blood cultures, chest x-ray, UA.  Diarrhea -Patient on Linzess -Has had several loose bowel movements -will discontinue Linzess -Given overnight leukocytosis and fever, will check for C. difficile  Hypotension -resolved -Per family has been ongoing for approximately 6 weeks -Patient was recently started on midodrine -Etiology unknown -Was given dose of Solu-Cortef upon admission -BP does appear to be improved  Acute kidney injury on chronic kidney disease, stage III -resolved -Creatinine on admission 1.50, baseline approximately 1.2 -Currently 0.95 -Continue to monitor BMP  Atrial fibrillation -CHADSVASC 3 -INR 2.85 -Continue cardizem -As above, given that patient's family would  like to proceed with surgery,  will hold Coumadin and start patient on heparin when INR is less than 2  Urinary retention -Foley catheter placed  -Continue flomax -will likely need outpatient urology follow up  Essential hypertension -As noted above, patient has been hypotensive -lotensin held  Dementia -Continue donepezil   Hyponatremia -resolved, sodium currently 138 -continue to monitor BMP  Aortic stenosis -Noted on echocardiogram in July 2017, mild   Deconditioning -Likely secondary to the above processes as well as prolonged hospital stay -PT and OT consulted and appreciated -Suspect patient will likely need SNF  DVT Prophylaxis Coumadin  Code Status: DNR  Family Communication: None at bedside.  Son via phone  Disposition Plan: Admitted.  Dental extractions planned for 12/06/2017.  Likely SNF at discharge.  Consultants Infectious disease Dentistry   Procedures  Echocardiogram Orthopantogram PICC line placement  Antibiotics   Anti-infectives (From admission, onward)   Start     Dose/Rate Route Frequency Ordered Stop   11/24/17 2200  vancomycin (VANCOCIN) IVPB 750 mg/150 ml premix  Status:  Discontinued     750 mg 150 mL/hr over 60 Minutes Intravenous Every 24 hours 11/23/17 2101 11/23/17 2105   11/24/17 2000  levofloxacin (LEVAQUIN) IVPB 750 mg  Status:  Discontinued     750 mg 100 mL/hr over 90 Minutes Intravenous Every 48 hours 11/22/17 2136 11/23/17 0038   11/24/17 1200  cefTRIAXone (ROCEPHIN) 2 g in sodium chloride 0.9 % 100 mL IVPB     2 g 200 mL/hr over 30 Minutes Intravenous Every 24 hours 11/24/17 1031     11/23/17 2200  vancomycin (VANCOCIN) IVPB 750 mg/150 ml premix  Status:  Discontinued     750 mg 150 mL/hr over 60 Minutes Intravenous Every 24 hours 11/22/17 2136 11/23/17 0038   11/23/17 2200  vancomycin (VANCOCIN) IVPB 750 mg/150 ml premix  Status:  Discontinued     750 mg 150 mL/hr over 60 Minutes Intravenous Every 24 hours 11/23/17 2105  11/24/17 1031   11/23/17 2115  vancomycin (VANCOCIN) IVPB 1000 mg/200 mL premix  Status:  Discontinued     1,000 mg 200 mL/hr over 60 Minutes Intravenous  Once 11/23/17 2101 11/23/17 2105   11/23/17 0600  aztreonam (AZACTAM) 1 g in sodium chloride 0.9 % 100 mL IVPB  Status:  Discontinued     1 g 200 mL/hr over 30 Minutes Intravenous Every 8 hours 11/22/17 2136 11/24/17 1031   11/22/17 2000  levofloxacin (LEVAQUIN) IVPB 750 mg     750 mg 100 mL/hr over 90 Minutes Intravenous  Once 11/22/17 1958 11/22/17 2315   11/22/17 2000  aztreonam (AZACTAM) 2 g in sodium chloride 0.9 % 100 mL IVPB     2 g 200 mL/hr over 30 Minutes Intravenous  Once 11/22/17 1958 11/23/17 0022   11/22/17 2000  vancomycin (VANCOCIN) IVPB 1000 mg/200 mL premix     1,000 mg 200 mL/hr over 60 Minutes Intravenous  Once 11/22/17 1958 11/23/17 0139      Subjective:   [name] seen and examined today..  Denies current chest pain, shortness breath, abdominal pain, nausea or vomiting.  Has had lots of diarrhea per reports from nursing.  Objective:   Vitals:   12/01/17 0100 12/01/17 0238 12/01/17 0443 12/01/17 0943  BP: 128/68  98/81 112/72  Pulse: (!) 104   100  Resp: 16   18  Temp:   98.6 F (37 C) 97.9 F (36.6 C)  TempSrc:   Oral Oral  SpO2: 98%   100%  Weight:  67.2 kg (148 lb 2.4 oz)    Height:        Intake/Output Summary (Last 24 hours) at 12/01/2017 1013 Last data filed at 12/01/2017 0600 Gross per 24 hour  Intake 5545 ml  Output 450 ml  Net 5095 ml   Filed Weights   11/22/17 1945 11/23/17 0748 12/01/17 0238  Weight: 62.1 kg (137 lb) 62.1 kg (137 lb) 67.2 kg (148 lb 2.4 oz)   Exam  General: Well developed, well nourished, thin, no apparent distress  HEENT: NCAT, mucous membranes moist.  Poor dentition  Neck: Supple  Cardiovascular: S1 S2 auscultated, 2/6 SEM, irregular  Respiratory: Clear to auscultation bilaterally with equal chest rise  Abdomen: Soft, nontender, nondistended, + bowel  sounds  Extremities: warm dry without cyanosis clubbing or edema  Neuro: AAOx3, nonfocal  Psych: appropriate mood and affect  Data Reviewed: I have personally reviewed following labs and imaging studies  CBC: Recent Labs  Lab 11/27/17 0224 11/28/17 0219 11/29/17 0221 11/30/17 0625 12/01/17 0432  WBC 8.3 8.3 7.0 8.7 17.5*  HGB 10.1* 10.8* 10.4* 9.4* 10.0*  HCT 30.5* 31.7* 30.4* 27.5* 28.6*  MCV 94.4 94.6 95.3 95.2 95.7  PLT 130* 139* 126* 109* 950*   Basic Metabolic Panel: Recent Labs  Lab 11/27/17 0224 11/28/17 0219 11/29/17 0221 11/30/17 0625 12/01/17 0432  NA 136 138 139 139 138  K 3.7 3.9 3.9 3.6 3.5  CL 109 109 109 111 110  CO2 21* 21* 22 21* 21*  GLUCOSE 97 99 96 104* 94  BUN 19 22* 17 16 19   CREATININE 0.94 0.94 0.96 0.90 0.95  CALCIUM 8.2* 8.4* 8.3* 8.3* 8.1*   GFR: Estimated Creatinine Clearance: 48.5 mL/min (by C-G formula based on SCr of 0.95 mg/dL). Liver Function Tests: No results for input(s): AST, ALT, ALKPHOS, BILITOT, PROT, ALBUMIN in the last 168 hours. No results for input(s): LIPASE, AMYLASE in the last 168 hours. No results for input(s): AMMONIA in the last 168 hours. Coagulation Profile: Recent Labs  Lab 11/27/17 0224 11/28/17 0219 11/29/17 0221 11/30/17 0625 12/01/17 0432  INR 2.47 2.60 2.37 2.57 2.85   Cardiac Enzymes: No results for input(s): CKTOTAL, CKMB, CKMBINDEX, TROPONINI in the last 168 hours. BNP (last 3 results) No results for input(s): PROBNP in the last 8760 hours. HbA1C: No results for input(s): HGBA1C in the last 72 hours. CBG: Recent Labs  Lab 11/25/17 1623 11/26/17 1110 11/26/17 1611  GLUCAP 119* 104* 118*   Lipid Profile: No results for input(s): CHOL, HDL, LDLCALC, TRIG, CHOLHDL, LDLDIRECT in the last 72 hours. Thyroid Function Tests: No results for input(s): TSH, T4TOTAL, FREET4, T3FREE, THYROIDAB in the last 72 hours. Anemia Panel: No results for input(s): VITAMINB12, FOLATE, FERRITIN, TIBC, IRON,  RETICCTPCT in the last 72 hours. Urine analysis:    Component Value Date/Time   COLORURINE AMBER (A) 12/01/2017 0825   APPEARANCEUR CLOUDY (A) 12/01/2017 0825   LABSPEC 1.023 12/01/2017 0825   PHURINE 5.0 12/01/2017 0825   GLUCOSEU NEGATIVE 12/01/2017 0825   HGBUR SMALL (A) 12/01/2017 0825   BILIRUBINUR NEGATIVE 12/01/2017 0825   KETONESUR 5 (A) 12/01/2017 0825   PROTEINUR 100 (A) 12/01/2017 0825   NITRITE NEGATIVE 12/01/2017 0825   LEUKOCYTESUR TRACE (A) 12/01/2017 0825   Sepsis Labs: @LABRCNTIP (procalcitonin:4,lacticidven:4)  ) Recent Results (from the past 240 hour(s))  Culture, blood (Routine x 2)     Status: Abnormal   Collection Time: 11/22/17  8:00 PM  Result Value Ref Range Status   Specimen Description BLOOD  LEFT ANTECUBITAL  Final   Special Requests   Final    BOTTLES DRAWN AEROBIC AND ANAEROBIC Blood Culture results may not be optimal due to an excessive volume of blood received in culture bottles   Culture  Setup Time   Final    GRAM POSITIVE COCCI IN BOTH AEROBIC AND ANAEROBIC BOTTLES CRITICAL RESULT CALLED TO, READ BACK BY AND VERIFIED WITHBronwen Betters [facility] 3299 11/23/17 A BROWNING Performed at [facility], [place] [address], [place], [place] [address]    Culture VIRIDANS STREPTOCOCCUS (A)  Final   Report Status 11/25/2017 FINAL  Final   Organism ID, Bacteria VIRIDANS STREPTOCOCCUS  Final      Susceptibility   Viridans streptococcus - MIC*    PENICILLIN <=0.06 SENSITIVE Sensitive     CEFTRIAXONE <=0.12 SENSITIVE Sensitive     ERYTHROMYCIN <=0.12 SENSITIVE Sensitive     LEVOFLOXACIN 0.5 SENSITIVE Sensitive     VANCOMYCIN 0.5 SENSITIVE Sensitive     * VIRIDANS STREPTOCOCCUS  Blood Culture ID Panel (Reflexed)     Status: Abnormal   Collection Time: 11/22/17  8:00 PM  Result Value Ref Range Status   Enterococcus species NOT DETECTED NOT DETECTED Final   Listeria monocytogenes NOT DETECTED NOT DETECTED Final   Staphylococcus species NOT DETECTED NOT  DETECTED Final   Staphylococcus aureus NOT DETECTED NOT DETECTED Final   Streptococcus species DETECTED (A) NOT DETECTED Final    Comment: Not Enterococcus species, Streptococcus agalactiae, Streptococcus pyogenes, or Streptococcus pneumoniae. CRITICAL RESULT CALLED TO, READ BACK BY AND VERIFIED WITH: Bronwen Betters PHARMD [id number] 11/23/17 A BROWNING    Streptococcus agalactiae NOT DETECTED NOT DETECTED Final   Streptococcus pneumoniae NOT DETECTED NOT DETECTED Final   Streptococcus pyogenes NOT DETECTED NOT DETECTED Final   Acinetobacter baumannii NOT DETECTED NOT DETECTED Final   Enterobacteriaceae species NOT DETECTED NOT DETECTED Final   Enterobacter cloacae complex NOT DETECTED NOT DETECTED Final   Escherichia coli NOT DETECTED NOT DETECTED Final   Klebsiella oxytoca NOT DETECTED NOT DETECTED Final   Klebsiella pneumoniae NOT DETECTED NOT DETECTED Final   Proteus species NOT DETECTED NOT DETECTED Final   Serratia marcescens NOT DETECTED NOT DETECTED Final   Haemophilus influenzae NOT DETECTED NOT DETECTED Final   Neisseria meningitidis NOT DETECTED NOT DETECTED Final   Pseudomonas aeruginosa NOT DETECTED NOT DETECTED Final   Candida albicans NOT DETECTED NOT DETECTED Final   Candida glabrata NOT DETECTED NOT DETECTED Final   Candida krusei NOT DETECTED NOT DETECTED Final   Candida parapsilosis NOT DETECTED NOT DETECTED Final   Candida tropicalis NOT DETECTED NOT DETECTED Final    Comment: Performed at [facility], [address], [place], [place] [address]  Culture, blood (Routine x 2)     Status: Abnormal   Collection Time: 11/22/17  8:02 PM  Result Value Ref Range Status   Specimen Description BLOOD RIGHT ANTECUBITAL  Final   Special Requests   Final    BOTTLES DRAWN AEROBIC AND ANAEROBIC Blood Culture adequate volume   Culture  Setup Time   Final    GRAM POSITIVE COCCI IN BOTH AEROBIC AND ANAEROBIC BOTTLES CRITICAL RESULT CALLED TO, READ BACK BY AND VERIFIED WITH: Bronwen Betters  PHARMD [id number] 11/23/17 A BROWNING    Culture (A)  Final    VIRIDANS STREPTOCOCCUS SUSCEPTIBILITIES PERFORMED ON PREVIOUS CULTURE WITHIN THE LAST 5 DAYS. Performed at [facility], [place] [address] [place], [place] [address]    Report Status 11/25/2017 FINAL  Final  Urine culture     Status: None   Collection Time: 11/22/17  8:36 PM  Result Value Ref Range Status   Specimen Description URINE, CATHETERIZED  Final   Special Requests NONE  Final   Culture   Final    NO GROWTH Performed at [facility], [address] [place], [place] [address]    Report Status 11/24/2017 FINAL  Final  Culture, blood (routine x 2)     Status: None   Collection Time: 11/24/17  3:47 PM  Result Value Ref Range Status   Specimen Description BLOOD LEFT ANTECUBITAL  Final   Special Requests IN PEDIATRIC BOTTLE Blood Culture adequate volume  Final   Culture   Final    NO GROWTH 5 DAYS Performed at [facility], [place] [address], [place], [place] [address]    Report Status 11/29/2017 FINAL  Final  Culture, blood (routine x 2)     Status: Abnormal   Collection Time: 11/24/17  3:53 PM  Result Value Ref Range Status   Specimen Description BLOOD LEFT HAND  Final   Special Requests IN PEDIATRIC BOTTLE Blood Culture adequate volume  Final   Culture  Setup Time   Final    GRAM POSITIVE COCCI IN PEDIATRIC BOTTLE CRITICAL VALUE NOTED.  VALUE IS CONSISTENT WITH PREVIOUSLY REPORTED AND CALLED VALUE.    Culture (A)  Final    VIRIDANS STREPTOCOCCUS SUSCEPTIBILITIES PERFORMED ON PREVIOUS CULTURE WITHIN THE LAST 5 DAYS. Performed at [facility], [place] [address], [place], [place] [address]    Report Status 11/27/2017 FINAL  Final  Culture, blood (routine x 2)     Status: None (Preliminary result)   Collection Time: 11/26/17 10:49 AM  Result Value Ref Range Status   Specimen Description BLOOD RIGHT ANTECUBITAL  Final   Special Requests   Final    BOTTLES DRAWN AEROBIC AND ANAEROBIC Blood Culture  adequate volume   Culture   Final    NO GROWTH 4 DAYS Performed at [facility], [place] [address], [place], [place] [address]    Report Status PENDING  Incomplete  Culture, blood (routine x 2)     Status: None (Preliminary result)   Collection Time: 11/26/17 10:55 AM  Result Value Ref Range Status   Specimen Description BLOOD LEFT ANTECUBITAL  Final   Special Requests   Final    BOTTLES DRAWN AEROBIC AND ANAEROBIC Blood Culture results may not be optimal due to an excessive volume of blood received in culture bottles   Culture   Final    NO GROWTH 4 DAYS Performed at [facility], [place] [address] [place], [place] [address]    Report Status PENDING  Incomplete      Radiology Studies: No results found.   Scheduled Meds: . allopurinol  300 mg Oral Daily  . cholecalciferol  1,000 Units Oral Daily  . diltiazem  120 mg Oral Daily  . donepezil  10 mg Oral QHS  . erythromycin  1 application Both Eyes Daily  . midodrine  10 mg Oral TID WC  . multivitamin with minerals  1 tablet Oral Q breakfast  . sodium chloride flush  10-40 mL Intracatheter Q12H  . tamsulosin  0.4 mg Oral Daily   Continuous Infusions: . sodium chloride 1,000 mL (12/01/17 0600)  . cefTRIAXone (ROCEPHIN)  IV Stopped (11/30/17 1411)     LOS: 8 days   Time Spent in minutes   45 minutes  Maiah Sinning D.O. on 12/01/2017 at 10:13 AM  Between 7am to 7pm - Pager - [phone]  After 7pm go to [email or web address] - password [email or web address]  And look for the night coverage person covering for me after hours  [facility] Office  [phone]

## 2017-12-02 DIAGNOSIS — A0472 Enterocolitis due to Clostridium difficile, not specified as recurrent: Secondary | ICD-10-CM

## 2017-12-02 LAB — CBC
HEMATOCRIT: 30.4 % — AB (ref 39.0–52.0)
HEMOGLOBIN: 10.4 g/dL — AB (ref 13.0–17.0)
MCH: 32.7 pg (ref 26.0–34.0)
MCHC: 34.2 g/dL (ref 30.0–36.0)
MCV: 95.6 fL (ref 78.0–100.0)
Platelets: 134 10*3/uL — ABNORMAL LOW (ref 150–400)
RBC: 3.18 MIL/uL — ABNORMAL LOW (ref 4.22–5.81)
RDW: 15.8 % — AB (ref 11.5–15.5)
WBC: 18.4 10*3/uL — ABNORMAL HIGH (ref 4.0–10.5)

## 2017-12-02 LAB — BASIC METABOLIC PANEL
ANION GAP: 8 (ref 5–15)
BUN: 29 mg/dL — AB (ref 6–20)
CHLORIDE: 110 mmol/L (ref 101–111)
CO2: 18 mmol/L — AB (ref 22–32)
Calcium: 8 mg/dL — ABNORMAL LOW (ref 8.9–10.3)
Creatinine, Ser: 1.04 mg/dL (ref 0.61–1.24)
GFR calc Af Amer: >60 mL/min (ref >60)
GLUCOSE: 97 mg/dL (ref 65–99)
POTASSIUM: 3.7 mmol/L (ref 3.5–5.1)
Sodium: 136 mmol/L (ref 135–145)

## 2017-12-02 LAB — URINE CULTURE: Culture: NO GROWTH

## 2017-12-02 LAB — PROTIME-INR
INR: 3.32
Prothrombin Time: 33.4 seconds — ABNORMAL HIGH (ref 11.4–15.2)

## 2017-12-02 MED ORDER — ENSURE ENLIVE PO LIQD
237.0000 mL | Freq: Two times a day (BID) | ORAL | Status: DC
  Administered 2017-12-02 – 2017-12-07 (×8): 237 mL via ORAL

## 2017-12-02 MED ORDER — CHLORHEXIDINE GLUCONATE 0.12 % MT SOLN
15.0000 mL | Freq: Two times a day (BID) | OROMUCOSAL | Status: DC
  Administered 2017-12-02 – 2017-12-09 (×16): 15 mL via OROMUCOSAL
  Filled 2017-12-02 (×16): qty 15

## 2017-12-02 MED ORDER — ORAL CARE MOUTH RINSE
15.0000 mL | Freq: Two times a day (BID) | OROMUCOSAL | Status: DC
  Administered 2017-12-02 – 2017-12-07 (×9): 15 mL via OROMUCOSAL

## 2017-12-02 MED ORDER — PHYTONADIONE 5 MG PO TABS
2.5000 mg | ORAL_TABLET | Freq: Once | ORAL | Status: AC
  Administered 2017-12-02: 2.5 mg via ORAL
  Filled 2017-12-02: qty 1

## 2017-12-02 NOTE — Progress Notes (Signed)
CSW spoke with patient' son. He reported that they would like patient to get short term rehab and [place] or [place] are closest to them. CSW to continue to follow.  Percell Locus Bridgid Printz LCSW [phone]

## 2017-12-02 NOTE — Progress Notes (Addendum)
ANTICOAGULATION CONSULT NOTE - Follow Up Consult  Pharmacy Consult for Coumadin > heparin Indication: atrial fibrillation  Allergies  Allergen Reactions  . Adhesive [Tape] Other (See Comments)    THE PATIENT'S SKIN IS VERY THIN AND WILL TEAR AND BRUISE VERY EASILY!!  . Sunflower Oil Itching  . Penicillins Rash    Has patient had a PCN reaction causing immediate rash, facial/tongue/throat swelling, SOB or lightheadedness with hypotension: Yes Has patient had a PCN reaction causing severe rash involving mucus membranes or skin necrosis: Unk Has patient had a PCN reaction that required hospitalization: No Has patient had a PCN reaction occurring within the last 10 years: No If all of the above answers are "NO", then may proceed with Cephalosporin use.     Patient Measurements: Height: 5\' 6"  (167.6 cm) Weight: 148 lb 2.4 oz (67.2 kg) IBW/kg (Calculated) : 63.8.  Vital Signs: Temp: 98.3 F (36.8 C) (02/28 0629) Temp Source: Oral (02/28 0629) BP: 145/96 (02/28 0629) Pulse Rate: 100 (02/28 0629)  Labs: Recent Labs    11/30/17 0625 12/01/17 0432 12/02/17 0429  HGB 9.4* 10.0* 10.4*  HCT 27.5* 28.6* 30.4*  PLT 109* 125* 134*  LABPROT 27.4* 29.7* 33.4*  INR 2.57 2.85 3.32  CREATININE 0.90 0.95 1.04    Estimated Creatinine Clearance: 44.3 mL/min (by C-G formula based on SCr of 1.04 mg/dL).   Assessment: 69 YOM with history of AFib (CHADSVASC = 3) on warfarin PTA. Originally warfarin was continued here, however it is now on hold due to need for dental extractions. Last dose of warfarin was given in the evening on 2/25.  INR continues to be elevated despite not receiving any warfarin- up to 3.32 this morning. To start IV heparin when INR is <2.  Hgb and plts increased slightly this morning. No overt bleeding noted.  Goal of Therapy:  Heparin level 0.3-0.7 units/ml Monitor platelets by anticoagulation protocol: Yes   Plan:  Daily INR Start heparin when INR <2 Follow for  any administration of vitamin K and the need to check INR sooner  Evoleht Hovatter D. Octavion Mollenkopf, PharmD, BCPS Clinical Pharmacist Clinical Phone for 12/02/2017 until 3:30pm: [phone] If after 3:30pm, please call main pharmacy at [phone] 12/02/2017 8:44 AM   ADDENDUM Spoke with Dr. Enrique Sack and Dr. [place] Crane. Decision made to give vitamin K 2.5mg  PO x1 tonight. Will continue to check daily INRs.  Endre Coutts D. Loran Auguste, PharmD, BCPS Clinical Pharmacist  12/02/2017 3:43 PM

## 2017-12-02 NOTE — NC FL2 (Signed)
[place] MEDICAID FL2 LEVEL OF CARE SCREENING TOOL     IDENTIFICATION  Patient Name: Peter Moreno Birthdate: 1927-03-25 Sex: male Admission Date (Current Location): 11/22/2017  [facility] and [place] Number:  Herbalist and Address:  [facility] [place]. [facility], [place] [address] [place], [place] [address]      Provider Number: [id number]  Attending Physician Name and Address:  Kayleen Memos, DO  Relative Name and Phone Number:  Nicki Reaper, son, [phone]    Current Level of Care: Hospital Recommended Level of Care: [place] Prior Approval Number:    Date Approved/Denied:   PASRR Number: [id number]  Discharge Plan: SNF    Current Diagnoses: Patient Active Problem List   Diagnosis Date Noted  . Streptococcal bacteremia   . Tooth abscess   . AKI (acute kidney injury) ([place])   . UTI (urinary tract infection) 11/22/2017  . Sepsis ([place]) 11/22/2017  . Hypotension 11/22/2017  . Hyponatremia 11/22/2017  . Alzheimer's dementia 09/29/2017  . Murmur 04/12/2015  . Encounter for therapeutic drug monitoring 11/14/2013  . Long term (current) use of anticoagulants 07/06/2013  . Atrial fibrillation with RVR ([place]) 06/22/2013  . Essential hypertension 06/22/2013    Orientation RESPIRATION BLADDER Height & Weight     Self, Place, Time  Normal Incontinent, Indwelling catheter Weight: 67.2 kg (148 lb 2.4 oz) Height:  5\' 6"  (167.6 cm)  BEHAVIORAL SYMPTOMS/MOOD NEUROLOGICAL BOWEL NUTRITION STATUS      Incontinent Diet(Please see DC Summary)  AMBULATORY STATUS COMMUNICATION OF NEEDS Skin   Limited Assist Verbally Normal                       Personal Care Assistance Level of Assistance  Bathing, Feeding, Dressing Bathing Assistance: Limited assistance Feeding assistance: Limited assistance Dressing Assistance: Limited assistance     Functional Limitations Info             SPECIAL CARE FACTORS FREQUENCY  PT (By licensed PT), OT (By  licensed OT)     PT Frequency: 5x/week OT Frequency: 3x/week            Contractures      Additional Factors Info  Code Status, Allergies, Isolation Precautions Code Status Info: DNR Allergies Info: Adhesive Tape, Sunflower Oil, Penicillins     Isolation Precautions Info: Enteric precautions     Current Medications (12/02/2017):  This is the current hospital active medication list Current Facility-Administered Medications  Medication Dose Route Frequency Provider Last Rate Last Dose  . 0.9 %  sodium chloride infusion  1,000 mL Intravenous Continuous Cherene Altes, MD 75 mL/hr at 12/02/17 0224 1,000 mL at 12/02/17 0224  . acetaminophen (TYLENOL) tablet 650 mg  650 mg Oral Q6H PRN Ivor Costa, MD       Or  . acetaminophen (TYLENOL) suppository 650 mg  650 mg Rectal Q6H PRN Ivor Costa, MD      . allopurinol (ZYLOPRIM) tablet 300 mg  300 mg Oral Daily Ivor Costa, MD   300 mg at 12/02/17 1005  . cefTRIAXone (ROCEPHIN) 2 g in sodium chloride 0.9 % 100 mL IVPB  2 g Intravenous Q24H Reginia Naas, [facility]   Stopped at 12/01/17 1211  . chlorhexidine (PERIDEX) 0.12 % solution 15 mL  15 mL Mouth Rinse BID Cristal Ford, DO   15 mL at 12/02/17 1005  . cholecalciferol (VITAMIN D) tablet 1,000 Units  1,000 Units Oral Daily Ivor Costa, MD   1,000 Units at 12/02/17  1006  . diltiazem (CARDIZEM CD) 24 hr capsule 120 mg  120 mg Oral Daily Cristal Ford, DO   120 mg at 12/02/17 1005  . donepezil (ARICEPT) tablet 10 mg  10 mg Oral QHS Ivor Costa, MD   10 mg at 12/01/17 2258  . erythromycin ophthalmic ointment 1 application  1 application Both Eyes Daily Ivor Costa, MD   1 application at 56/25/63 1005  . MEDLINE mouth rinse  15 mL Mouth Rinse q12n4p Mikhail, [place], DO      . midodrine (PROAMATINE) tablet 10 mg  10 mg Oral TID WC Ivor Costa, MD   10 mg at 12/02/17 0800  . multivitamin with minerals tablet 1 tablet  1 tablet Oral Q breakfast Ivor Costa, MD   1 tablet at 12/02/17 0800  .  ondansetron (ZOFRAN) tablet 4 mg  4 mg Oral Q6H PRN Ivor Costa, MD       Or  . ondansetron [facility]) injection 4 mg  4 mg Intravenous Q6H PRN Ivor Costa, MD      . polyethylene glycol (MIRALAX / GLYCOLAX) packet 17 g  17 g Oral Daily PRN Ivor Costa, MD      . sodium chloride flush (NS) 0.9 % injection 10-40 mL  10-40 mL Intracatheter Q12H Mikhail, [place], DO   10 mL at 12/02/17 1006  . sodium chloride flush (NS) 0.9 % injection 10-40 mL  10-40 mL Intracatheter PRN Mikhail, Velta Addison, DO      . sodium chloride flush (NS) 0.9 % injection 10-40 mL  10-40 mL Intracatheter PRN Cristal Ford, DO   10 mL at 12/01/17 0438  . tamsulosin (FLOMAX) capsule 0.4 mg  0.4 mg Oral Daily Cristal Ford, DO   0.4 mg at 12/02/17 1006  . vancomycin (VANCOCIN) 50 mg/mL oral solution 125 mg  125 mg Oral QID Cristal Ford, DO   125 mg at 12/02/17 1007  . zolpidem (AMBIEN) tablet 5 mg  5 mg Oral QHS PRN Ivor Costa, MD   5 mg at 11/29/17 2114     Discharge Medications: Please see discharge summary for a list of discharge medications.  Relevant Imaging Results:  Relevant Lab Results:   Additional Information SSN: [name] [place], [place]

## 2017-12-02 NOTE — Plan of Care (Signed)
Progressing

## 2017-12-02 NOTE — Progress Notes (Signed)
PROGRESS NOTE  Peter Moreno UXL:[id number] DOB: Aug 19, 1927 DOA: 11/22/2017 PCP: Cyndy Freeze, MD  HPI/Recap of past 24 hours: Peter Moreno a 84 y.o.malewith medical history significant ofhypertension, atrial fibrillation on Coumadin, skin cancer, dementia, gout,who presents with difficulty urinating and urge for urination. Admitted and found to have sepsis secondary to strep viridans bacteremia suspect secondary to dental caries. Dentistry consulted. Developed fever with leukocytosis, pending repeat blood cultures, UA, chest x-ray.  12/02/17: seen and examined. No new complaints. Denies chest pain or dyspnea.  Assessment/Plan: Principal Problem:   UTI (urinary tract infection) Active Problems:   Atrial fibrillation with RVR (HCC)   Essential hypertension   Long term (current) use of anticoagulants   Alzheimer's dementia   Sepsis ([place])   Hypotension   Hyponatremia   Streptococcal bacteremia   Tooth abscess   Sepsis 2/2 to strep bacteremia, c-diff colitis, UTI, poa -suspect mouth source of bacteremia  -positive blood cx 2/20 sensitive to ceftriaxone -continue IV ceftriaxone -repeat blood cx no growth to date -CBC am -Orthopantogram showed periapical lucency involving both main right mandibular molars and left mandibular premolar. -PICC line placed on 2/24 -Dentistry, Dr. Enrique Sack, consulted and appreciated. Discussed with family, would like to proceed with tooth extractions. Per Dr. Enrique Sack, surgery possibly on 3/4 -Coumadin discontinued on 11/30/2017, start on heparin once INR <2  Cdiff colitis, poa -persistent stool output -continue oral vancomycin  UTI, poa -continue IV ceftriaxone  Hypotension, resolved -continue to monitor VS  Chronic Afib -chadsvasc score 3 -elevated INR  supratherapeutic INR -pharmacy managing -po vitamin k given due to dental procedure planned -INR am  Chronic normocytic anemia -no sign of overt bleeding -hg 10 -cbc  am     Code Status: DNR  Family Communication: none at bedside  Disposition Plan: to be determined   Consultants:  Dental medicine  Procedures:  none  Antimicrobials:  Po van IV ceftriaxone  DVT prophylaxis:  SCDs   Objective: Vitals:   12/01/17 1514 12/01/17 1855 12/01/17 2045 12/02/17 0629  BP: (!) 127/96 (!) 148/84 123/83 (!) 145/96  Pulse:  (!) 117 84 100  Resp: 18 18 20 19   Temp: 98.3 F (36.8 C) 98.6 F (37 C) (!) 97.3 F (36.3 C) 98.3 F (36.8 C)  TempSrc: Oral Oral Oral Oral  SpO2: 98% 99% 99% 98%  Weight:      Height:        Intake/Output Summary (Last 24 hours) at 12/02/2017 0958 Last data filed at 12/01/2017 1700 Gross per 24 hour  Intake 1387.5 ml  Output 200 ml  Net 1187.5 ml   Filed Weights   11/22/17 1945 11/23/17 0748 12/01/17 0238  Weight: 62.1 kg (137 lb) 62.1 kg (137 lb) 67.2 kg (148 lb 2.4 oz)    Exam:   General:  84 yo CM WD WN NAD   Cardiovascular: IRR no rubs or gallops   Respiratory: CTA no wheezes or rales  Abdomen: soft hyper active bowel sounds, NT ND  Musculoskeletal: no focal abnormalities  Skin: no rash  Psychiatry: appropriate   Data Reviewed: CBC: Recent Labs  Lab 11/28/17 0219 11/29/17 0221 11/30/17 0625 12/01/17 0432 12/02/17 0429  WBC 8.3 7.0 8.7 17.5* 18.4*  HGB 10.8* 10.4* 9.4* 10.0* 10.4*  HCT 31.7* 30.4* 27.5* 28.6* 30.4*  MCV 94.6 95.3 95.2 95.7 95.6  PLT 139* 126* 109* 125* 536*   Basic Metabolic Panel: Recent Labs  Lab 11/28/17 0219 11/29/17 0221 11/30/17 0625 12/01/17 0432 12/02/17 0429  NA 138 139 139  138 136  K 3.9 3.9 3.6 3.5 3.7  CL 109 109 111 110 110  CO2 21* 22 21* 21* 18*  GLUCOSE 99 96 104* 94 97  BUN 22* 17 16 19  29*  CREATININE 0.94 0.96 0.90 0.95 1.04  CALCIUM 8.4* 8.3* 8.3* 8.1* 8.0*   GFR: Estimated Creatinine Clearance: 44.3 mL/min (by C-G formula based on SCr of 1.04 mg/dL). Liver Function Tests: No results for input(s): AST, ALT, ALKPHOS, BILITOT,  PROT, ALBUMIN in the last 168 hours. No results for input(s): LIPASE, AMYLASE in the last 168 hours. No results for input(s): AMMONIA in the last 168 hours. Coagulation Profile: Recent Labs  Lab 11/28/17 0219 11/29/17 0221 11/30/17 0625 12/01/17 0432 12/02/17 0429  INR 2.60 2.37 2.57 2.85 3.32   Cardiac Enzymes: No results for input(s): CKTOTAL, CKMB, CKMBINDEX, TROPONINI in the last 168 hours. BNP (last 3 results) No results for input(s): PROBNP in the last 8760 hours. HbA1C: No results for input(s): HGBA1C in the last 72 hours. CBG: Recent Labs  Lab 11/25/17 1623 11/26/17 1110 11/26/17 1611  GLUCAP 119* 104* 118*   Lipid Profile: No results for input(s): CHOL, HDL, LDLCALC, TRIG, CHOLHDL, LDLDIRECT in the last 72 hours. Thyroid Function Tests: No results for input(s): TSH, T4TOTAL, FREET4, T3FREE, THYROIDAB in the last 72 hours. Anemia Panel: No results for input(s): VITAMINB12, FOLATE, FERRITIN, TIBC, IRON, RETICCTPCT in the last 72 hours. Urine analysis:    Component Value Date/Time   COLORURINE AMBER (A) 12/01/2017 0825   APPEARANCEUR CLOUDY (A) 12/01/2017 0825   LABSPEC 1.023 12/01/2017 0825   PHURINE 5.0 12/01/2017 0825   GLUCOSEU NEGATIVE 12/01/2017 0825   HGBUR SMALL (A) 12/01/2017 0825   BILIRUBINUR NEGATIVE 12/01/2017 0825   KETONESUR 5 (A) 12/01/2017 0825   PROTEINUR 100 (A) 12/01/2017 0825   NITRITE NEGATIVE 12/01/2017 0825   LEUKOCYTESUR TRACE (A) 12/01/2017 0825   Sepsis Labs: @LABRCNTIP (procalcitonin:4,lacticidven:4)  ) Recent Results (from the past 240 hour(s))  Culture, blood (Routine x 2)     Status: Abnormal   Collection Time: 11/22/17  8:00 PM  Result Value Ref Range Status   Specimen Description BLOOD LEFT ANTECUBITAL  Final   Special Requests   Final    BOTTLES DRAWN AEROBIC AND ANAEROBIC Blood Culture results may not be optimal due to an excessive volume of blood received in culture bottles   Culture  Setup Time   Final    GRAM  POSITIVE COCCI IN BOTH AEROBIC AND ANAEROBIC BOTTLES CRITICAL RESULT CALLED TO, READ BACK BY AND VERIFIED WITHBronwen Betters [facility] [id number] 11/23/17 A BROWNING Performed at [facility], [place] [address], [place],  [address]    Culture VIRIDANS STREPTOCOCCUS (A)  Final   Report Status 11/25/2017 FINAL  Final   Organism ID, Bacteria VIRIDANS STREPTOCOCCUS  Final      Susceptibility   Viridans streptococcus - MIC*    PENICILLIN <=0.06 SENSITIVE Sensitive     CEFTRIAXONE <=0.12 SENSITIVE Sensitive     ERYTHROMYCIN <=0.12 SENSITIVE Sensitive     LEVOFLOXACIN 0.5 SENSITIVE Sensitive     VANCOMYCIN 0.5 SENSITIVE Sensitive     * VIRIDANS STREPTOCOCCUS  Blood Culture ID Panel (Reflexed)     Status: Abnormal   Collection Time: 11/22/17  8:00 PM  Result Value Ref Range Status   Enterococcus species NOT DETECTED NOT DETECTED Final   Listeria monocytogenes NOT DETECTED NOT DETECTED Final   Staphylococcus species NOT DETECTED NOT DETECTED Final   Staphylococcus aureus NOT DETECTED NOT  DETECTED Final   Streptococcus species DETECTED (A) NOT DETECTED Final    Comment: Not Enterococcus species, Streptococcus agalactiae, Streptococcus pyogenes, or Streptococcus pneumoniae. CRITICAL RESULT CALLED TO, READ BACK BY AND VERIFIED WITH: Bronwen Betters PHARMD [id number] 11/23/17 A BROWNING    Streptococcus agalactiae NOT DETECTED NOT DETECTED Final   Streptococcus pneumoniae NOT DETECTED NOT DETECTED Final   Streptococcus pyogenes NOT DETECTED NOT DETECTED Final   Acinetobacter baumannii NOT DETECTED NOT DETECTED Final   Enterobacteriaceae species NOT DETECTED NOT DETECTED Final   Enterobacter cloacae complex NOT DETECTED NOT DETECTED Final   Escherichia coli NOT DETECTED NOT DETECTED Final   Klebsiella oxytoca NOT DETECTED NOT DETECTED Final   Klebsiella pneumoniae NOT DETECTED NOT DETECTED Final   Proteus species NOT DETECTED NOT DETECTED Final   Serratia marcescens NOT DETECTED NOT DETECTED Final    Haemophilus influenzae NOT DETECTED NOT DETECTED Final   Neisseria meningitidis NOT DETECTED NOT DETECTED Final   Pseudomonas aeruginosa NOT DETECTED NOT DETECTED Final   Candida albicans NOT DETECTED NOT DETECTED Final   Candida glabrata NOT DETECTED NOT DETECTED Final   Candida krusei NOT DETECTED NOT DETECTED Final   Candida parapsilosis NOT DETECTED NOT DETECTED Final   Candida tropicalis NOT DETECTED NOT DETECTED Final    Comment: Performed at [facility], [place]. [address], [place], [place] [address]  Culture, blood (Routine x 2)     Status: Abnormal   Collection Time: 11/22/17  8:02 PM  Result Value Ref Range Status   Specimen Description BLOOD RIGHT ANTECUBITAL  Final   Special Requests   Final    BOTTLES DRAWN AEROBIC AND ANAEROBIC Blood Culture adequate volume   Culture  Setup Time   Final    GRAM POSITIVE COCCI IN BOTH AEROBIC AND ANAEROBIC BOTTLES CRITICAL RESULT CALLED TO, READ BACK BY AND VERIFIED WITH: Bronwen Betters PHARMD [id number] 11/23/17 A BROWNING    Culture (A)  Final    VIRIDANS STREPTOCOCCUS SUSCEPTIBILITIES PERFORMED ON PREVIOUS CULTURE WITHIN THE LAST 5 DAYS. Performed at [facility], [place] [address], [place], [place] [address]    Report Status 11/25/2017 FINAL  Final  Urine culture     Status: None   Collection Time: 11/22/17  8:36 PM  Result Value Ref Range Status   Specimen Description URINE, CATHETERIZED  Final   Special Requests NONE  Final   Culture   Final    NO GROWTH Performed at [facility], [place] [address] [place], [place] [address]    Report Status 11/24/2017 FINAL  Final  Culture, blood (routine x 2)     Status: None   Collection Time: 11/24/17  3:47 PM  Result Value Ref Range Status   Specimen Description BLOOD LEFT ANTECUBITAL  Final   Special Requests IN PEDIATRIC BOTTLE Blood Culture adequate volume  Final   Culture   Final    NO GROWTH 5 DAYS Performed at [facility], [place] [address] [place], [place] [address]     Report Status 11/29/2017 FINAL  Final  Culture, blood (routine x 2)     Status: Abnormal   Collection Time: 11/24/17  3:53 PM  Result Value Ref Range Status   Specimen Description BLOOD LEFT HAND  Final   Special Requests IN PEDIATRIC BOTTLE Blood Culture adequate volume  Final   Culture  Setup Time   Final    GRAM POSITIVE COCCI IN PEDIATRIC BOTTLE CRITICAL VALUE NOTED.  VALUE IS CONSISTENT WITH PREVIOUSLY REPORTED  AND CALLED VALUE.    Culture (A)  Final    VIRIDANS STREPTOCOCCUS SUSCEPTIBILITIES PERFORMED ON PREVIOUS CULTURE WITHIN THE LAST 5 DAYS. Performed at [facility], [place] [address], [place], [place] [address]    Report Status 11/27/2017 FINAL  Final  Culture, blood (routine x 2)     Status: None   Collection Time: 11/26/17 10:49 AM  Result Value Ref Range Status   Specimen Description BLOOD RIGHT ANTECUBITAL  Final   Special Requests   Final    BOTTLES DRAWN AEROBIC AND ANAEROBIC Blood Culture adequate volume   Culture   Final    NO GROWTH 5 DAYS Performed at [facility], [place] [address] [place], [place] [address]    Report Status 12/01/2017 FINAL  Final  Culture, blood (routine x 2)     Status: None   Collection Time: 11/26/17 10:55 AM  Result Value Ref Range Status   Specimen Description BLOOD LEFT ANTECUBITAL  Final   Special Requests   Final    BOTTLES DRAWN AEROBIC AND ANAEROBIC Blood Culture results may not be optimal due to an excessive volume of blood received in culture bottles   Culture   Final    NO GROWTH 5 DAYS Performed at [facility], [place] [address], [place], [place] [address]    Report Status 12/01/2017 FINAL  Final  C difficile quick scan w PCR reflex     Status: Abnormal   Collection Time: 12/01/17  8:15 AM  Result Value Ref Range Status   C Diff antigen POSITIVE (A) NEGATIVE Final   C Diff toxin POSITIVE (A) NEGATIVE Final   C Diff interpretation Toxin producing C. difficile detected.  Final    Comment: CRITICAL RESULT  CALLED TO, READ BACK BY AND VERIFIED WITH: Donetta Potts RN 10:20 12/01/17 ([email or web address])   Culture, blood (routine x 2)     Status: None (Preliminary result)   Collection Time: 12/01/17 11:40 AM  Result Value Ref Range Status   Specimen Description BLOOD LEFT ANTECUBITAL  Final   Special Requests   Final    BOTTLES DRAWN AEROBIC ONLY Blood Culture adequate volume Performed at [facility], [address], [place], [place] [address]    Culture PENDING  Incomplete   Report Status PENDING  Incomplete      Studies: No results found.  Scheduled Meds: . allopurinol  300 mg Oral Daily  . chlorhexidine  15 mL Mouth Rinse BID  . cholecalciferol  1,000 Units Oral Daily  . diltiazem  120 mg Oral Daily  . donepezil  10 mg Oral QHS  . erythromycin  1 application Both Eyes Daily  . mouth rinse  15 mL Mouth Rinse q12n4p  . midodrine  10 mg Oral TID WC  . multivitamin with minerals  1 tablet Oral Q breakfast  . sodium chloride flush  10-40 mL Intracatheter Q12H  . tamsulosin  0.4 mg Oral Daily  . vancomycin  125 mg Oral QID    Continuous Infusions: . sodium chloride 1,000 mL (12/02/17 0224)  . cefTRIAXone (ROCEPHIN)  IV Stopped (12/01/17 1211)     LOS: 9 days     Kayleen Memos, MD [facility] Pager [phone]  If 7PM-7AM, please contact night-coverage [email or web address] Password [email or web address] 12/02/2017, 9:58 AM

## 2017-12-03 ENCOUNTER — Encounter (HOSPITAL_COMMUNITY): Payer: Self-pay | Admitting: Certified Registered"

## 2017-12-03 LAB — HEPARIN LEVEL (UNFRACTIONATED): Heparin Unfractionated: 0.12 IU/mL — ABNORMAL LOW (ref 0.30–0.70)

## 2017-12-03 LAB — BASIC METABOLIC PANEL
Anion gap: 7 (ref 5–15)
BUN: 34 mg/dL — AB (ref 6–20)
CHLORIDE: 108 mmol/L (ref 101–111)
CO2: 19 mmol/L — AB (ref 22–32)
CREATININE: 1.07 mg/dL (ref 0.61–1.24)
Calcium: 7.9 mg/dL — ABNORMAL LOW (ref 8.9–10.3)
GFR calc Af Amer: >60 mL/min (ref >60)
GFR calc non Af Amer: 60 mL/min — ABNORMAL LOW (ref >60)
Glucose, Bld: 103 mg/dL — ABNORMAL HIGH (ref 65–99)
Potassium: 3.4 mmol/L — ABNORMAL LOW (ref 3.5–5.1)
SODIUM: 134 mmol/L — AB (ref 135–145)

## 2017-12-03 LAB — CBC
HEMATOCRIT: 29.7 % — AB (ref 39.0–52.0)
Hemoglobin: 9.9 g/dL — ABNORMAL LOW (ref 13.0–17.0)
MCH: 31.9 pg (ref 26.0–34.0)
MCHC: 33.3 g/dL (ref 30.0–36.0)
MCV: 95.8 fL (ref 78.0–100.0)
Platelets: 138 10*3/uL — ABNORMAL LOW (ref 150–400)
RBC: 3.1 MIL/uL — ABNORMAL LOW (ref 4.22–5.81)
RDW: 15.8 % — AB (ref 11.5–15.5)
WBC: 17.1 10*3/uL — ABNORMAL HIGH (ref 4.0–10.5)

## 2017-12-03 LAB — PROTIME-INR
INR: 1.95
Prothrombin Time: 22 seconds — ABNORMAL HIGH (ref 11.4–15.2)

## 2017-12-03 MED ORDER — POTASSIUM CHLORIDE CRYS ER 20 MEQ PO TBCR
40.0000 meq | EXTENDED_RELEASE_TABLET | Freq: Two times a day (BID) | ORAL | Status: AC
  Administered 2017-12-03 – 2017-12-04 (×2): 40 meq via ORAL
  Filled 2017-12-03 (×2): qty 2

## 2017-12-03 MED ORDER — HEPARIN (PORCINE) IN NACL 100-0.45 UNIT/ML-% IJ SOLN
1300.0000 [IU]/h | INTRAMUSCULAR | Status: DC
  Administered 2017-12-03: 900 [IU]/h via INTRAVENOUS
  Administered 2017-12-04: 1250 [IU]/h via INTRAVENOUS
  Administered 2017-12-06: 1300 [IU]/h via INTRAVENOUS
  Filled 2017-12-03 (×6): qty 250

## 2017-12-03 NOTE — Progress Notes (Signed)
ANTICOAGULATION CONSULT NOTE - Follow Up Consult  Pharmacy Consult for heparin Indication: atrial fibrillation  Allergies  Allergen Reactions  . Adhesive [Tape] Other (See Comments)    THE PATIENT'S SKIN IS VERY THIN AND WILL TEAR AND BRUISE VERY EASILY!!  . Sunflower Oil Itching  . Penicillins Rash    Has patient had a PCN reaction causing immediate rash, facial/tongue/throat swelling, SOB or lightheadedness with hypotension: Yes Has patient had a PCN reaction causing severe rash involving mucus membranes or skin necrosis: Unk Has patient had a PCN reaction that required hospitalization: No Has patient had a PCN reaction occurring within the last 10 years: No If all of the above answers are "NO", then may proceed with Cephalosporin use.     Patient Measurements: Height: 5\' 6"  (167.6 cm) Weight: 148 lb 2.4 oz (67.2 kg) IBW/kg (Calculated) : 63.8  Vital Signs: Temp: 98.3 F (36.8 C) (03/01 0620) Temp Source: Axillary (03/01 0620) BP: 97/51 (03/01 0620) Pulse Rate: 89 (03/01 0620)  Labs: Recent Labs    12/01/17 0432 12/02/17 0429 12/03/17 0435  HGB 10.0* 10.4* 9.9*  HCT 28.6* 30.4* 29.7*  PLT 125* 134* 138*  LABPROT 29.7* 33.4* 22.0*  INR 2.85 3.32 1.95  CREATININE 0.95 1.04 1.07    Estimated Creatinine Clearance: 43.1 mL/min (by C-G formula based on SCr of 1.07 mg/dL).   Assessment: 50 YOM with history of AFib (CHADSVASC = 3) on warfarin PTA. Originally warfarin was continued here, however it is now on hold due to need for dental extractions. Last dose of warfarin was given in the evening on 2/25.  INR is now subtherapeutic at 1.95 s/p vitamin K 2.5 mg so will begin heparin drip. Hgb low stable in 9-10s, platelets are low. No overt bleeding noted.  Goal of Therapy:  Heparin level 0.3-0.7 units/ml Monitor platelets by anticoagulation protocol: Yes   Plan:  Begin heparin drip at 900 units/hr with no bolus 8 hr heparin level Daily heparin level and CBC Monitor  for s/sx of bleeding   Peter Moreno, PharmD, BCPS Clinical Pharmacist Clinical phone for 12/03/2017 until 4p is [phone] After 4p, please call Main Rx at [phone] for assistance 12/03/2017 8:04 AM

## 2017-12-03 NOTE — Progress Notes (Signed)
PROGRESS NOTE  Peter Moreno:[id number] DOB: [birth year] DOA: 11/22/2017 PCP: Cyndy Freeze, MD  HPI/Recap of past 24 hours: Peter Moreno a 84 y.o.malewith medical history significant ofhypertension, atrial fibrillation on Coumadin, skin cancer, dementia, gout,who presents with difficulty urinating and urge for urination. Admitted and found to have sepsis secondary to strep viridans bacteremia suspect secondary to dental caries. Dentistry consulted and also following.  12/03/17: patient seen and examined at his bedside, [age] reports lower abdominal pain and continues to have moderate stool output from his flexceal. Denies chills, chest pain, or dyspnea.   Assessment/Plan: Principal Problem:   UTI (urinary tract infection) Active Problems:   Atrial fibrillation with RVR (HCC)   Essential hypertension   Long term (current) use of anticoagulants   Alzheimer's dementia   Sepsis ([place])   Hypotension   Hyponatremia   Streptococcal bacteremia   Tooth abscess   Sepsis 2/2 to strep bacteremia, c-diff colitis, UTI, poa -suspect mouth/poor dentition as source of bacteremia  -positive blood cx 2/20 sensitive to ceftriaxone -continue IV ceftriaxone -repeat blood cx no growth to date -Orthopantogram showed periapical lucency involving both main right mandibular molars and left mandibular premolar. -PICC line placed on 2/24 -Dentistry, Dr. Enrique Sack, consulted and appreciated. Discussed with family, would like to proceed with tooth extractions. Per Dr. Enrique Sack, surgery possibly on 3/4 -Coumadin discontinued on 11/30/2017, start on heparin once INR <2  Poor dentition/dental carries -procedure planned by dentistry medicine -INR goal of less than 2.0 for procedure -INR today 1.95 post 2.5 vitamin K  Cdiff colitis, poa -persistent stool output -continue oral vancomycin  Hypokalemia 2/2 to diarrhea from c-diff -k+ 3.4 -replete as indicated -K= and mag supplement provided  UTI,  poa -continue IV ceftriaxone  Hypotension -maintain MAP 65 or above -has responded well to IV fluid -continue to monitor VS  Chronic Afib -rate controlled -chadsvasc score 3 -coumadin held due to planned procedure by dentistry medicine  supratherapeutic INR, resolved -pharmacy managing -po vitamin k 2.5 given due to dental procedure planned -INR 1.95  Chronic normocytic anemia -no sign of overt bleeding -hg 9.9 from 10     Code Status: DNR  Family Communication: none at bedside  Disposition Plan: SNF when medically stable  Consultants:  Dental medicine  Procedures:  none  Antimicrobials:  Po vancomycin, IV ceftriaxone  DVT prophylaxis:  SCDs   Objective: Vitals:   12/02/17 1500 12/02/17 2211 12/03/17 0620 12/03/17 1403  BP: (!) 91/51 (!) 98/58 (!) 97/51 (!) 99/50  Pulse: 87 87 89 82  Resp:  20 16 18   Temp:  98.4 F (36.9 C) 98.3 F (36.8 C) (!) 97.4 F (36.3 C)  TempSrc:  Oral Axillary Oral  SpO2:  98% 96% 100%  Weight:      Height:        Intake/Output Summary (Last 24 hours) at 12/03/2017 1633 Last data filed at 12/03/2017 1500 Gross per 24 hour  Intake 2418.5 ml  Output 740 ml  Net 1678.5 ml   Filed Weights   11/22/17 1945 11/23/17 0748 12/01/17 0238  Weight: 62.1 kg (137 lb) 62.1 kg (137 lb) 67.2 kg (148 lb 2.4 oz)    Exam: 12/03/17 patient seen and examined. Physical exam unchanged from yesterday.   General:  84 yo CM WD WN NAD   Cardiovascular: IRR no rubs or gallops   Respiratory: CTA no wheezes or rales  Abdomen: soft hyper active bowel sounds, NT ND  Musculoskeletal: no focal abnormalities  Skin: no rash  Psychiatry: appropriate  for condition and setting   Data Reviewed: CBC: Recent Labs  Lab 11/29/17 0221 11/30/17 0625 12/01/17 0432 12/02/17 0429 12/03/17 0435  WBC 7.0 8.7 17.5* 18.4* 17.1*  HGB 10.4* 9.4* 10.0* 10.4* 9.9*  HCT 30.4* 27.5* 28.6* 30.4* 29.7*  MCV 95.3 95.2 95.7 95.6 95.8  PLT 126* 109* 125*  134* 427*   Basic Metabolic Panel: Recent Labs  Lab 11/29/17 0221 11/30/17 0625 12/01/17 0432 12/02/17 0429 12/03/17 0435  NA 139 139 138 136 134*  K 3.9 3.6 3.5 3.7 3.4*  CL 109 111 110 110 108  CO2 22 21* 21* 18* 19*  GLUCOSE 96 104* 94 97 103*  BUN 17 16 19  29* 34*  CREATININE 0.96 0.90 0.95 1.04 1.07  CALCIUM 8.3* 8.3* 8.1* 8.0* 7.9*   GFR: Estimated Creatinine Clearance: 43.1 mL/min (by C-G formula based on SCr of 1.07 mg/dL). Liver Function Tests: No results for input(s): AST, ALT, ALKPHOS, BILITOT, PROT, ALBUMIN in the last 168 hours. No results for input(s): LIPASE, AMYLASE in the last 168 hours. No results for input(s): AMMONIA in the last 168 hours. Coagulation Profile: Recent Labs  Lab 11/29/17 0221 11/30/17 0625 12/01/17 0432 12/02/17 0429 12/03/17 0435  INR 2.37 2.57 2.85 3.32 1.95   Cardiac Enzymes: No results for input(s): CKTOTAL, CKMB, CKMBINDEX, TROPONINI in the last 168 hours. BNP (last 3 results) No results for input(s): PROBNP in the last 8760 hours. HbA1C: No results for input(s): HGBA1C in the last 72 hours. CBG: No results for input(s): GLUCAP in the last 168 hours. Lipid Profile: No results for input(s): CHOL, HDL, LDLCALC, TRIG, CHOLHDL, LDLDIRECT in the last 72 hours. Thyroid Function Tests: No results for input(s): TSH, T4TOTAL, FREET4, T3FREE, THYROIDAB in the last 72 hours. Anemia Panel: No results for input(s): VITAMINB12, FOLATE, FERRITIN, TIBC, IRON, RETICCTPCT in the last 72 hours. Urine analysis:    Component Value Date/Time   COLORURINE AMBER (A) 12/01/2017 0825   APPEARANCEUR CLOUDY (A) 12/01/2017 0825   LABSPEC 1.023 12/01/2017 0825   PHURINE 5.0 12/01/2017 0825   GLUCOSEU NEGATIVE 12/01/2017 0825   HGBUR SMALL (A) 12/01/2017 0825   BILIRUBINUR NEGATIVE 12/01/2017 0825   KETONESUR 5 (A) 12/01/2017 0825   PROTEINUR 100 (A) 12/01/2017 0825   NITRITE NEGATIVE 12/01/2017 0825   LEUKOCYTESUR TRACE (A) 12/01/2017 0825    Sepsis Labs: @LABRCNTIP (procalcitonin:4,lacticidven:4)  ) Recent Results (from the past 240 hour(s))  Culture, blood (routine x 2)     Status: None   Collection Time: 11/24/17  3:47 PM  Result Value Ref Range Status   Specimen Description BLOOD LEFT ANTECUBITAL  Final   Special Requests IN PEDIATRIC BOTTLE Blood Culture adequate volume  Final   Culture   Final    NO GROWTH 5 DAYS Performed at [facility], [place] [address], [place], [place] [address]    Report Status 11/29/2017 FINAL  Final  Culture, blood (routine x 2)     Status: Abnormal   Collection Time: 11/24/17  3:53 PM  Result Value Ref Range Status   Specimen Description BLOOD LEFT HAND  Final   Special Requests IN PEDIATRIC BOTTLE Blood Culture adequate volume  Final   Culture  Setup Time   Final    GRAM POSITIVE COCCI IN PEDIATRIC BOTTLE CRITICAL VALUE NOTED.  VALUE IS CONSISTENT WITH PREVIOUSLY REPORTED AND CALLED VALUE.    Culture (A)  Final    VIRIDANS STREPTOCOCCUS SUSCEPTIBILITIES PERFORMED ON PREVIOUS CULTURE WITHIN THE LAST 5 DAYS. Performed at [facility], [place] [address] [place], [place] [address]    Report Status 11/27/2017 FINAL  Final  Culture, blood (routine x 2)     Status: None   Collection Time: 11/26/17 10:49 AM  Result Value Ref Range Status   Specimen Description BLOOD RIGHT ANTECUBITAL  Final   Special Requests   Final    BOTTLES DRAWN AEROBIC AND ANAEROBIC Blood Culture adequate volume   Culture   Final    NO GROWTH 5 DAYS Performed at [facility], [place] [address], [place], [place] [address]    Report Status 12/01/2017 FINAL  Final  Culture, blood (routine x 2)     Status: None   Collection Time: 11/26/17 10:55 AM  Result Value Ref Range Status   Specimen Description BLOOD LEFT ANTECUBITAL  Final   Special Requests   Final    BOTTLES DRAWN AEROBIC AND ANAEROBIC Blood Culture results may not be optimal due to an excessive volume of blood received in culture bottles    Culture   Final    NO GROWTH 5 DAYS Performed at [facility], [place] [address] [place], [place] [address]    Report Status 12/01/2017 FINAL  Final  Culture, blood (routine x 2)     Status: None (Preliminary result)   Collection Time: 12/01/17  7:30 AM  Result Value Ref Range Status   Specimen Description BLOOD LEFT ANTECUBITAL  Final   Special Requests IN PEDIATRIC BOTTLE Blood Culture adequate volume  Final   Culture   Final    NO GROWTH 2 DAYS Performed at [facility], [place] [address], [place], [place] [address]    Report Status PENDING  Incomplete  C difficile quick scan w PCR reflex     Status: Abnormal   Collection Time: 12/01/17  8:15 AM  Result Value Ref Range Status   C Diff antigen POSITIVE (A) NEGATIVE Final   C Diff toxin POSITIVE (A) NEGATIVE Final   C Diff interpretation Toxin producing C. difficile detected.  Final    Comment: CRITICAL RESULT CALLED TO, READ BACK BY AND VERIFIED WITH: Donetta Potts RN 10:20 12/01/17 ([email or web address])   Urine Culture     Status: None   Collection Time: 12/01/17 10:22 AM  Result Value Ref Range Status   Specimen Description URINE, CLEAN CATCH  Final   Special Requests NONE  Final   Culture   Final    NO GROWTH Performed at [facility], [address] [place], [place] [address]    Report Status 12/02/2017 FINAL  Final  Culture, blood (routine x 2)     Status: None (Preliminary result)   Collection Time: 12/01/17 11:40 AM  Result Value Ref Range Status   Specimen Description BLOOD LEFT ANTECUBITAL  Final   Special Requests   Final    BOTTLES DRAWN AEROBIC ONLY Blood Culture adequate volume   Culture   Final    NO GROWTH 2 DAYS Performed at [facility], [place] [address], [place], [place] [address]    Report Status PENDING  Incomplete      Studies: No results found.  Scheduled Meds: . allopurinol  300 mg Oral Daily  . chlorhexidine  15 mL Mouth Rinse BID  . cholecalciferol  1,000 Units Oral Daily  .  diltiazem  120 mg Oral Daily  . donepezil  10 mg Oral QHS  . erythromycin  1 application Both Eyes Daily  . feeding supplement (ENSURE ENLIVE)  237 mL Oral BID BM  . mouth rinse  15 mL Mouth Rinse q12n4p  . midodrine  10 mg Oral TID WC  . multivitamin with minerals  1 tablet Oral Q breakfast  . sodium chloride flush  10-40 mL Intracatheter Q12H  . tamsulosin  0.4 mg Oral Daily  . vancomycin  125 mg Oral QID    Continuous Infusions: . sodium chloride 1,000 mL (12/03/17 1500)  . cefTRIAXone (ROCEPHIN)  IV Stopped (12/03/17 1355)  . heparin 900 Units/hr (12/03/17 1044)     LOS: 10 days     Kayleen Memos, MD [facility] Pager [phone]  If 7PM-7AM, please contact night-coverage [email or web address] Password [facility] 12/03/2017, 4:33 PM

## 2017-12-03 NOTE — Progress Notes (Signed)
Physical Therapy Treatment Patient Details Name: Peter Moreno MRN: [id number] DOB: Feb 06, 1927 Today's Date: 12/03/2017    History of Present Illness Pt is a 84 y/o male admitted 11/22/17 with progressive weakness and confusion; found to have UTI with sepsis. Plan for potential tooth extraction surgery on 3/4. PMH significant for skin CA, HTN, a-fib, dementia.    PT Comments    Pt progressing slowly and remains to have flexiseal in place due to bowel complications.  Pt tolerated short bout of gait training but did fatigue quickly.  Plan next session to progress gait training to tolerance.  Pt will continue to benefit from SNF placement to improve strength and function.    Follow Up Recommendations  Supervision/Assistance - 24 hour;SNF     Equipment Recommendations  None recommended by PT    Recommendations for Other Services Rehab consult;OT consult     Precautions / Restrictions Precautions Precautions: Fall Precaution Comments: skin breakdown Restrictions Weight Bearing Restrictions: No    Mobility  Bed Mobility Overal bed mobility: Needs Assistance Bed Mobility: Supine to Sit;Sit to Supine     Supine to sit: Mod assist;+2 for physical assistance Sit to supine: Max assist;+2 for physical assistance(increased assistance require due to fatigue.  )   General bed mobility comments: Pt required mod assistance +2 to elevate trunk into sitting.  Pt reaching for PTA and PT tech for support.  Once in sitting patient required cues to scoot closer to edge of bed and to push from matress, to scoot he still required assistance from PTA with use of bed pad.  To return to supine patient instructed to lie back into the bed, he lost balance backward and required assistance from PT tech to control descent to matress and return to supine.  PTA assisted with progression of LEs back into bed.  +2 max to scoot to [facility].    Transfers Overall transfer level: Needs assistance Equipment used: Rolling  walker (2 wheeled) Transfers: Sit to/from Stand Sit to Stand: Mod assist         General transfer comment: Cues for hand placement to and from seated surface.  Pt slow to ascend and require mod assistance to boost into standing.    Ambulation/Gait Ambulation/Gait assistance: Mod assist Ambulation Distance (Feet): 20 Feet(with in room due to cdiff precautions.  ) +2 for equipment.  Assistive device: Rolling walker (2 wheeled) Gait Pattern/deviations: Step-to pattern;Decreased step length - right;Decreased step length - left;Shuffle;Narrow base of support   Gait velocity interpretation: Below normal speed for age/gender General Gait Details: Pt remains unsteady on feet with slight posterior lean when ambulating.  Cues to step closer to the RW to position himself in a more safe manner.  Pt required two lengthy standing rest breaks.  Unable to advance further at this time secondary to fatigue.   Stairs            Wheelchair Mobility    Modified Rankin (Stroke Patients Only)       Balance Overall balance assessment: Needs assistance   Sitting balance-Leahy Scale: Poor Sitting balance - Comments: Posterior LOB when returning to supine     Standing balance-Leahy Scale: Poor Standing balance comment: reliant on UE support and external assistance to maintain standing balance.                              Cognition Arousal/Alertness: Awake/alert Behavior During Therapy: WFL for tasks assessed/performed Overall Cognitive Status: Within Functional  Limits for tasks assessed                                        Exercises      General Comments        Pertinent Vitals/Pain Pain Assessment: No/denies pain    Home Living                      Prior Function            PT Goals (current goals can now be found in the care plan section) Acute Rehab PT Goals Patient Stated Goal: to get better and stop going to the bathroom Potential  to Achieve Goals: Fair Progress towards PT goals: Progressing toward goals    Frequency    Min 3X/week      PT Plan Current plan remains appropriate    Co-evaluation              AM-PAC PT "6 Clicks" Daily Activity  Outcome Measure  Difficulty turning over in bed (including adjusting bedclothes, sheets and blankets)?: Unable Difficulty moving from lying on back to sitting on the side of the bed? : Unable Difficulty sitting down on and standing up from a chair with arms (e.g., wheelchair, bedside commode, etc,.)?: Unable Help needed moving to and from a bed to chair (including a wheelchair)?: A Lot Help needed walking in hospital room?: A Lot Help needed climbing 3-5 steps with a railing? : Total 6 Click Score: 8    End of Session Equipment Utilized During Treatment: Gait belt Activity Tolerance: Patient tolerated treatment well Patient left: with call bell/phone within reach;with family/visitor present;in bed Nurse Communication: Mobility status PT Visit Diagnosis: Unsteadiness on feet (R26.81);Muscle weakness (generalized) (M62.81)     Time: 6269-4854 PT Time Calculation (min) (ACUTE ONLY): 18 min  Charges:  $Gait Training: 8-22 mins                    G Codes:       Governor Rooks, PTA pager [phone]    Cristela Blue 12/03/2017, 11:29 AM

## 2017-12-03 NOTE — Progress Notes (Signed)
Occupational Therapy Treatment Patient Details Name: Peter Moreno MRN: [id number] DOB: [birth year] Today's Date: 12/03/2017    History of present illness Pt is a 84 y/o male admitted 11/22/17 with progressive weakness and confusion; found to have UTI with sepsis. Plan for potential tooth extraction surgery on 3/4. PMH significant for skin CA, HTN, a-fib, dementia.   OT comments  PATIENT WAS ABLE TO SIT EOB WITH MIN A AND WAS ABLE TO SIT AT S LEVEL STATIC FOR 5 MIN AND REQUIRES MIN/MOD A FOR BALANCE TO DON/DOFF SOCKS WITH PNT LOSING BALANCE POSTERIORLY. PNT WAS ABLE TO SIT UNASSISTED FOR GROOMING TASKS. PATIENT WAS MIN A WITH SIT TO STAND FROM BED AND TO SIDE STEP UP TO HEAD OF BED. PATIENT WAS MIN A WITH SUPINE TO SIT. WILL CONTINUE TO WORK WITH PATIENT.  Follow Up Recommendations       Equipment Recommendations       Recommendations for Other Services      Precautions / Restrictions Precautions Precautions: Fall Precaution Comments: skin breakdown Restrictions Weight Bearing Restrictions: No       Mobility Bed Mobility Overal bed mobility: Needs Assistance Bed Mobility: Supine to Sit;Sit to Supine     Supine to sit: Min assist Sit to supine: Min assist   General bed mobility comments: Pt required mod assistance +2 to elevate trunk into sitting.  Pt reaching for PTA and PT tech for support.  Once in sitting patient required cues to scoot closer to edge of bed and to push from matress, to scoot he still required assistance from PTA with use of bed pad.  To return to supine patient instructed to lie back into the bed, he lost balance backward and required assistance from PT tech to control descent to matress and return to supine.  PTA assisted with progression of LEs back into bed.  +2 max to scoot to [facility].    Transfers Overall transfer level: Needs assistance Equipment used: Rolling walker (2 wheeled) Transfers: Sit to/from Stand Sit to Stand: Mod assist         General  transfer comment: Cues for hand placement to and from seated surface.  Pt slow to ascend and require mod assistance to boost into standing.      Balance Overall balance assessment: Needs assistance   Sitting balance-Leahy Scale: Poor Sitting balance - Comments: Posterior LOB when returning to supine     Standing balance-Leahy Scale: Poor Standing balance comment: reliant on UE support and external assistance to maintain standing balance.                             ADL either performed or assessed with clinical judgement   ADL       Grooming: Wash/dry hands;Wash/dry face;Brushing hair;Supervision/safety;Set up               Lower Body Dressing: Moderate assistance(PATIENT REQUIRED ASSIST FOR SITTING BALANCE. )               Functional mobility during ADLs: Minimal assistance(PNT WAS MIN A WITH SIT TO STAND TO TO SIDE STEP TO HOB.) General ADL Comments: PATIENT HAS DECRASED ABILITY SECONDARY TO WEAKNESS AND DECREASES SITTING BALANCE.     Vision       Perception     Praxis      Cognition Arousal/Alertness: Awake/alert Behavior During Therapy: WFL for tasks assessed/performed Overall Cognitive Status: Within Functional Limits for tasks assessed  Exercises     Shoulder Instructions       General Comments      Pertinent Vitals/ Pain       Pain Assessment: No/denies pain  Home Living                                          Prior Functioning/Environment              Frequency           Progress Toward Goals  OT Goals(current goals can now be found in the care plan section)  Progress towards OT goals: Progressing toward goals  Acute Rehab OT Goals Patient Stated Goal: to get better and stop going to the bathroom Potential to Achieve Goals: Good  Plan Discharge plan remains appropriate    Co-evaluation                 AM-PAC PT "6 Clicks"  Daily Activity     Outcome Measure   Help from another person eating meals?: None Help from another person taking care of personal grooming?: A Little Help from another person toileting, which includes using toliet, bedpan, or urinal?: A Lot Help from another person bathing (including washing, rinsing, drying)?: A Lot Help from another person to put on and taking off regular upper body clothing?: A Little Help from another person to put on and taking off regular lower body clothing?: A Lot 6 Click Score: 16    End of Session    OT Visit Diagnosis: Unsteadiness on feet (R26.81);Other abnormalities of gait and mobility (R26.89)   Activity Tolerance Patient tolerated treatment well   Patient Left in bed;with call bell/phone within reach;with bed alarm set;with nursing/sitter in room   Nurse Communication          Time: 2706-2376 OT Time Calculation (min): 34 min  Charges: OT General Charges $OT Visit: 1 Visit OT Treatments $Self Care/Home Management : 8-22 mins $Therapeutic Activity: 2-83 mins  6 CLICKS   Armistead Sult 12/03/2017, 12:12 PM

## 2017-12-03 NOTE — Progress Notes (Signed)
ANTICOAGULATION CONSULT NOTE - Follow Up Consult  Pharmacy Consult for heparin Indication: atrial fibrillation  Allergies  Allergen Reactions  . Adhesive [Tape] Other (See Comments)    THE PATIENT'S SKIN IS VERY THIN AND WILL TEAR AND BRUISE VERY EASILY!!  . Sunflower Oil Itching  . Penicillins Rash    Has patient had a PCN reaction causing immediate rash, facial/tongue/throat swelling, SOB or lightheadedness with hypotension: Yes Has patient had a PCN reaction causing severe rash involving mucus membranes or skin necrosis: Unk Has patient had a PCN reaction that required hospitalization: No Has patient had a PCN reaction occurring within the last 10 years: No If all of the above answers are "NO", then may proceed with Cephalosporin use.     Patient Measurements: Height: 5\' 6"  (167.6 cm) Weight: 148 lb 2.4 oz (67.2 kg) IBW/kg (Calculated) : 63.8  Vital Signs: Temp: 97.4 F (36.3 C) (03/01 1403) Temp Source: Oral (03/01 1403) BP: 99/50 (03/01 1403) Pulse Rate: 82 (03/01 1403)  Labs: Recent Labs    12/01/17 0432 12/02/17 0429 12/03/17 0435 12/03/17 1950  HGB 10.0* 10.4* 9.9*  --   HCT 28.6* 30.4* 29.7*  --   PLT 125* 134* 138*  --   LABPROT 29.7* 33.4* 22.0*  --   INR 2.85 3.32 1.95  --   HEPARINUNFRC  --   --   --  0.12*  CREATININE 0.95 1.04 1.07  --     Estimated Creatinine Clearance: 43.1 mL/min (by C-G formula based on SCr of 1.07 mg/dL).   Assessment: 67 YOM with history of AFib (CHADSVASC = 3) on warfarin PTA. Originally warfarin was continued here, however it is now on hold due to need for dental extractions. Last dose of warfarin was given in the evening on 2/25.  INR is now subtherapeutic at 1.95 s/p vitamin K 2.5 mg so will begin heparin drip. Hgb low stable in 9-10s, platelets are low. No overt bleeding noted.  Initial hep lvl 0.12  Goal of Therapy:  Heparin level 0.3-0.7 units/ml Monitor platelets by anticoagulation protocol: Yes   Plan:   Increase heparin to 1100 units/hr - no bolus since inr close to therapeutic range Daily heparin level and CBC Monitor for s/sx of bleeding  Levester Fresh, PharmD, BCPS, BCCCP Clinical Pharmacist Clinical phone for 12/03/2017 from 1430 - 2300: [phone] If after 2300, please call main pharmacy at: [phone] 12/03/2017 8:18 PM

## 2017-12-04 LAB — CBC
HEMATOCRIT: 27.8 % — AB (ref 39.0–52.0)
Hemoglobin: 9.4 g/dL — ABNORMAL LOW (ref 13.0–17.0)
MCH: 32.3 pg (ref 26.0–34.0)
MCHC: 33.8 g/dL (ref 30.0–36.0)
MCV: 95.5 fL (ref 78.0–100.0)
Platelets: 136 10*3/uL — ABNORMAL LOW (ref 150–400)
RBC: 2.91 MIL/uL — ABNORMAL LOW (ref 4.22–5.81)
RDW: 15.6 % — AB (ref 11.5–15.5)
WBC: 10.8 10*3/uL — AB (ref 4.0–10.5)

## 2017-12-04 LAB — HEPARIN LEVEL (UNFRACTIONATED)
Heparin Unfractionated: 0.21 IU/mL — ABNORMAL LOW (ref 0.30–0.70)
Heparin Unfractionated: 0.28 IU/mL — ABNORMAL LOW (ref 0.30–0.70)

## 2017-12-04 LAB — PROTIME-INR
INR: 1.46
Prothrombin Time: 17.6 seconds — ABNORMAL HIGH (ref 11.4–15.2)

## 2017-12-04 NOTE — Progress Notes (Signed)
ANTICOAGULATION CONSULT NOTE - Follow Up Consult  Pharmacy Consult for Heparin (warfarin on hold) Indication: atrial fibrillation  Allergies  Allergen Reactions  . Adhesive [Tape] Other (See Comments)    THE PATIENT'S SKIN IS VERY THIN AND WILL TEAR AND BRUISE VERY EASILY!!  . Sunflower Oil Itching  . Penicillins Rash    Has patient had a PCN reaction causing immediate rash, facial/tongue/throat swelling, SOB or lightheadedness with hypotension: Yes Has patient had a PCN reaction causing severe rash involving mucus membranes or skin necrosis: Unk Has patient had a PCN reaction that required hospitalization: No Has patient had a PCN reaction occurring within the last 10 years: No If all of the above answers are "NO", then may proceed with Cephalosporin use.     Patient Measurements: Height: 5\' 6"  (167.6 cm) Weight: 148 lb 2.4 oz (67.2 kg) IBW/kg (Calculated) : 63.8  Vital Signs: Temp: 97.8 F (36.6 C) (03/01 2250) Temp Source: Oral (03/01 2250) BP: 99/60 (03/01 2250) Pulse Rate: 80 (03/01 2250)  Labs: Recent Labs    12/02/17 0429 12/03/17 0435 12/03/17 1950 12/04/17 0434  HGB 10.4* 9.9*  --  9.4*  HCT 30.4* 29.7*  --  27.8*  PLT 134* 138*  --  136*  LABPROT 33.4* 22.0*  --  17.6*  INR 3.32 1.95  --  1.46  HEPARINUNFRC  --   --  0.12* 0.21*  CREATININE 1.04 1.07  --   --     Estimated Creatinine Clearance: 43.1 mL/min (by C-G formula based on SCr of 1.07 mg/dL).   Assessment: 40 YOM with history of AFib (CHADSVASC = 3) on warfarin PTA. Originally warfarin was continued here, however it is now on hold due to need for dental extractions. Last dose of warfarin was given in the evening on 2/25.  INR is now subtherapeutic at 1.95 s/p vitamin K 2.5 mg so will begin heparin drip. Hgb low stable in 9-10s, platelets are low. No overt bleeding noted.  Update 3/2 AM: heparin level remains low but is trending up, no issues per RN.   Goal of Therapy:  Heparin level 0.3-0.7  units/ml Monitor platelets by anticoagulation protocol: Yes   Plan:  Increase heparin to 1250 units/hr 1400 HL  Narda Bonds, PharmD, BCPS Clinical Pharmacist Phone: [phone]

## 2017-12-04 NOTE — Progress Notes (Signed)
ANTICOAGULATION CONSULT NOTE - Follow Up Consult  Pharmacy Consult for heparin Indication: atrial fibrillation  Allergies  Allergen Reactions  . Adhesive [Tape] Other (See Comments)    THE PATIENT'S SKIN IS VERY THIN AND WILL TEAR AND BRUISE VERY EASILY!!  . Sunflower Oil Itching  . Penicillins Rash    Has patient had a PCN reaction causing immediate rash, facial/tongue/throat swelling, SOB or lightheadedness with hypotension: Yes Has patient had a PCN reaction causing severe rash involving mucus membranes or skin necrosis: Unk Has patient had a PCN reaction that required hospitalization: No Has patient had a PCN reaction occurring within the last 10 years: No If all of the above answers are "NO", then may proceed with Cephalosporin use.     Patient Measurements: Height: 5\' 6"  (167.6 cm) Weight: 148 lb 2.4 oz (67.2 kg) IBW/kg (Calculated) : 63.8  Vital Signs: Temp: 98.2 F (36.8 C) (03/02 1435) Temp Source: Oral (03/02 1435) BP: 89/63 (03/02 1435)  Labs: Recent Labs    12/02/17 0429 12/03/17 0435 12/03/17 1950 12/04/17 0434 12/04/17 1722  HGB 10.4* 9.9*  --  9.4*  --   HCT 30.4* 29.7*  --  27.8*  --   PLT 134* 138*  --  136*  --   LABPROT 33.4* 22.0*  --  17.6*  --   INR 3.32 1.95  --  1.46  --   HEPARINUNFRC  --   --  0.12* 0.21* 0.28*  CREATININE 1.04 1.07  --   --   --     Estimated Creatinine Clearance: 43.1 mL/min (by C-G formula based on SCr of 1.07 mg/dL).   Assessment: 76 YOM with history of AFib (CHADSVASC = 3) on warfarin PTA. Originally warfarin was continued here, however it is now on hold due to need for dental extractions. Last dose of warfarin was given in the evening on 2/25.  Heparin initiated 3/1 and remains subtherapeutic despite multiple rate increases.  Today, RN held heparin x 30 minutes for administration of Rocephin at 1230.  She thought they were not compatible, however, they are compatible and I have informed her of this for the  future.  No overt bleeding noted.  Goal of Therapy:  Heparin level 0.3-0.7 units/ml Monitor platelets by anticoagulation protocol: Yes   Plan:  Increase heparin to 1400 units/hr. 6 hr heparin level Daily heparin level and CBC Monitor for s/sx of bleeding   [employer], Pharm.D., BCPS Clinical Pharmacist 12/04/2017 7:47 PM

## 2017-12-04 NOTE — Progress Notes (Signed)
Pt. In deep sleep and unresponsive to sternal rub and cold compress on forehead. VS stable BP 107/67 and HR 85. His peripheral IV has infiltrated and heparin restarted on PICC line. Awoke from deep sleep as daughter walked in room. Patient response " I was dreaming I was suppose to die." Will continue to monitor.

## 2017-12-04 NOTE — Progress Notes (Signed)
[facility]                                                                              Patient Demographics  Peter Moreno, is a 84 y.o. male, DOB - 29-Apr-1927, LZJ:[id number]  Admit date - 11/22/2017   Admitting Physician Ivor Costa, MD  Outpatient Primary MD for the patient is Cyndy Freeze, MD  Outpatient specialists:   LOS - 11  days   Medical records reviewed and are as summarized below:    Chief Complaint  Patient presents with  . Weakness       Brief summary   Peter Moreno a 84 y.o.malewith medical history significant ofhypertension, atrial fibrillation on Coumadin, skin cancer, dementia, gout,who presents with difficulty urinating and urge for urination. Admitted and found to have sepsis secondary to strep viridans bacteremia suspect secondary to dental caries. Dentistry consulted and also following.    Assessment & Plan    Principal Problem:  Sepsis due to Streptococcal bacteremia, UTI, C. difficile colitis - Sepsis physiology resolved, suspect poor dental caries as a source of bacteremia - Blood cultures 2/20 was positive for Streptococcus viridans, continue IV ceftriaxone - Repeat blood cultures have shown no growth - Orthopantogram showed periapical lucency involving both main) debridement of the left mandibular premolars - Dr. Lawana Chambers (dentistry) was consulted and family would like to proceed with tooth extractions, possibly on 3/4 per Dr. Lawana Chambers  Active Problems: Chronic atrial fibrillation - Rate controlled, continue Cardizem - [place] VASC 3, Coumadin held for dental surgery on 3/4    Essential hypertension - Presented with hypotension due to sepsis, responded well to IV fluids  C. difficile colitis - Still has a rectal tube, continue oral vancomycin  ? UTI - UA positive for UTI however urine culture showed no growth, currently on IV Rocephin  Chronic normocytic anemia - H&H currently stable,   Dementia - Currently  stable, continue Aricept   Code Status: DO NOT RESUSCITATE DVT Prophylaxis:  Heparin drip Family Communication: Discussed in detail with the patient, all imaging results, lab results explained to the patient  Disposition Plan: Likely need skilled nursing facility after workup is complete  Time Spent in minutes  35 minutes  Procedures:  None  Consultants:   Dental medicine  Antimicrobials:   Oral vancomycin  IV ceftriaxone   Medications  Scheduled Meds: . allopurinol  300 mg Oral Daily  . chlorhexidine  15 mL Mouth Rinse BID  . cholecalciferol  1,000 Units Oral Daily  . diltiazem  120 mg Oral Daily  . donepezil  10 mg Oral QHS  . erythromycin  1 application Both Eyes Daily  . feeding supplement (ENSURE ENLIVE)  237 mL Oral BID BM  . mouth rinse  15 mL Mouth Rinse q12n4p  . midodrine  10 mg Oral TID WC  . multivitamin with minerals  1 tablet Oral Q breakfast  . sodium chloride flush  10-40 mL Intracatheter Q12H  . tamsulosin  0.4 mg Oral Daily  . vancomycin  125 mg Oral QID   Continuous Infusions: . sodium chloride 1,000 mL (12/03/17 1721)  . cefTRIAXone (ROCEPHIN)  IV Stopped (12/03/17 1355)  .  heparin 1,250 Units/hr (12/04/17 0835)   PRN Meds:.acetaminophen **OR** acetaminophen, ondansetron **OR** ondansetron (ZOFRAN) IV, polyethylene glycol, sodium chloride flush, sodium chloride flush, zolpidem   Antibiotics   Anti-infectives (From admission, onward)   Start     Dose/Rate Route Frequency Ordered Stop   12/01/17 1130  vancomycin (VANCOCIN) 50 mg/mL oral solution 125 mg     125 mg Oral 4 times daily 12/01/17 1045 12/11/17 0959   11/24/17 2200  vancomycin (VANCOCIN) IVPB 750 mg/150 ml premix  Status:  Discontinued     750 mg 150 mL/hr over 60 Minutes Intravenous Every 24 hours 11/23/17 2101 11/23/17 2105   11/24/17 2000  levofloxacin (LEVAQUIN) IVPB 750 mg  Status:  Discontinued     750 mg 100 mL/hr over 90 Minutes Intravenous Every 48 hours 11/22/17 2136  11/23/17 0038   11/24/17 1200  cefTRIAXone (ROCEPHIN) 2 g in sodium chloride 0.9 % 100 mL IVPB     2 g 200 mL/hr over 30 Minutes Intravenous Every 24 hours 11/24/17 1031     11/23/17 2200  vancomycin (VANCOCIN) IVPB 750 mg/150 ml premix  Status:  Discontinued     750 mg 150 mL/hr over 60 Minutes Intravenous Every 24 hours 11/22/17 2136 11/23/17 0038   11/23/17 2200  vancomycin (VANCOCIN) IVPB 750 mg/150 ml premix  Status:  Discontinued     750 mg 150 mL/hr over 60 Minutes Intravenous Every 24 hours 11/23/17 2105 11/24/17 1031   11/23/17 2115  vancomycin (VANCOCIN) IVPB 1000 mg/200 mL premix  Status:  Discontinued     1,000 mg 200 mL/hr over 60 Minutes Intravenous  Once 11/23/17 2101 11/23/17 2105   11/23/17 0600  aztreonam (AZACTAM) 1 g in sodium chloride 0.9 % 100 mL IVPB  Status:  Discontinued     1 g 200 mL/hr over 30 Minutes Intravenous Every 8 hours 11/22/17 2136 11/24/17 1031   11/22/17 2000  levofloxacin (LEVAQUIN) IVPB 750 mg     750 mg 100 mL/hr over 90 Minutes Intravenous  Once 11/22/17 1958 11/22/17 2315   11/22/17 2000  aztreonam (AZACTAM) 2 g in sodium chloride 0.9 % 100 mL IVPB     2 g 200 mL/hr over 30 Minutes Intravenous  Once 11/22/17 1958 11/23/17 0022   11/22/17 2000  vancomycin (VANCOCIN) IVPB 1000 mg/200 mL premix     1,000 mg 200 mL/hr over 60 Minutes Intravenous  Once 11/22/17 1958 11/23/17 0139        Subjective:   Choice Tout was seen and examined today. Feels a lot better today, smiling and pleasant. Denies any specific complaints. Patient denies dizziness, chest pain, shortness of breath, abdominal pain, N/V/D/C, new weakness, numbess, tingling. No acute events overnight.    Objective:   Vitals:   12/03/17 0620 12/03/17 1403 12/03/17 2250 12/04/17 0617  BP: (!) 97/51 (!) 99/50 99/60 105/60  Pulse: 89 82 80 94  Resp: 16 18 18 18   Temp: 98.3 F (36.8 C) (!) 97.4 F (36.3 C) 97.8 F (36.6 C) 98.2 F (36.8 C)  TempSrc: Axillary Oral Oral Oral    SpO2: 96% 100% 100% 100%  Weight:      Height:        Intake/Output Summary (Last 24 hours) at 12/04/2017 1007 Last data filed at 12/04/2017 0800 Gross per 24 hour  Intake 1214.75 ml  Output 1350 ml  Net -135.25 ml     Wt Readings from Last 3 Encounters:  12/01/17 67.2 kg (148 lb 2.4 oz)  09/29/17 63 kg (  139 lb)  05/25/17 62.6 kg (138 lb)     Exam  General: Alert and oriented, NAD  Eyes,  HEENT:  Cardiovascular: S1 S2 auscultated, irregularly irregular  Respiratory: Clear to auscultation bilaterally, no wheezing, rales or rhonchi  Gastrointestinal: Soft, nontender, nondistended, + bowel sounds  Ext: no pedal edema bilaterally  Neuro: no new deficits  Musculoskeletal: No digital cyanosis, clubbing  Skin: No rashes  Psych: Normal affect and demeanor, alert and oriented x3    Data Reviewed:  I have personally reviewed following labs and imaging studies  Micro Results Recent Results (from the past 240 hour(s))  Culture, blood (routine x 2)     Status: None   Collection Time: 11/24/17  3:47 PM  Result Value Ref Range Status   Specimen Description BLOOD LEFT ANTECUBITAL  Final   Special Requests IN PEDIATRIC BOTTLE Blood Culture adequate volume  Final   Culture   Final    NO GROWTH 5 DAYS Performed at [facility], [address] [place], [place] [address]    Report Status 11/29/2017 FINAL  Final  Culture, blood (routine x 2)     Status: Abnormal   Collection Time: 11/24/17  3:53 PM  Result Value Ref Range Status   Specimen Description BLOOD LEFT HAND  Final   Special Requests IN PEDIATRIC BOTTLE Blood Culture adequate volume  Final   Culture  Setup Time   Final    GRAM POSITIVE COCCI IN PEDIATRIC BOTTLE CRITICAL VALUE NOTED.  VALUE IS CONSISTENT WITH PREVIOUSLY REPORTED AND CALLED VALUE.    Culture (A)  Final    VIRIDANS STREPTOCOCCUS SUSCEPTIBILITIES PERFORMED ON PREVIOUS CULTURE WITHIN THE LAST 5 DAYS. Performed at [facility],  [place] [address], [place], [place] [address]    Report Status 11/27/2017 FINAL  Final  Culture, blood (routine x 2)     Status: None   Collection Time: 11/26/17 10:49 AM  Result Value Ref Range Status   Specimen Description BLOOD RIGHT ANTECUBITAL  Final   Special Requests   Final    BOTTLES DRAWN AEROBIC AND ANAEROBIC Blood Culture adequate volume   Culture   Final    NO GROWTH 5 DAYS Performed at [facility], [place] [address] [place], [place] [address]    Report Status 12/01/2017 FINAL  Final  Culture, blood (routine x 2)     Status: None   Collection Time: 11/26/17 10:55 AM  Result Value Ref Range Status   Specimen Description BLOOD LEFT ANTECUBITAL  Final   Special Requests   Final    BOTTLES DRAWN AEROBIC AND ANAEROBIC Blood Culture results may not be optimal due to an excessive volume of blood received in culture bottles   Culture   Final    NO GROWTH 5 DAYS Performed at [facility], [place] [address] [place], [place] [address]    Report Status 12/01/2017 FINAL  Final  Culture, blood (routine x 2)     Status: None (Preliminary result)   Collection Time: 12/01/17  7:30 AM  Result Value Ref Range Status   Specimen Description BLOOD LEFT ANTECUBITAL  Final   Special Requests IN PEDIATRIC BOTTLE Blood Culture adequate volume  Final   Culture   Final    NO GROWTH 2 DAYS Performed at [facility], [place] [address], [place], [place] [address]    Report Status PENDING  Incomplete  C difficile quick scan w PCR reflex     Status: Abnormal  Collection Time: 12/01/17  8:15 AM  Result Value Ref Range Status   C Diff antigen POSITIVE (A) NEGATIVE Final   C Diff toxin POSITIVE (A) NEGATIVE Final   C Diff interpretation Toxin producing C. difficile detected.  Final    Comment: CRITICAL RESULT CALLED TO, READ BACK BY AND VERIFIED WITH: Donetta Potts RN 10:20 12/01/17 ([email or web address])   Urine Culture     Status: None   Collection Time: 12/01/17 10:22 AM  Result Value Ref Range Status     Specimen Description URINE, CLEAN CATCH  Final   Special Requests NONE  Final   Culture   Final    NO GROWTH Performed at [facility], [address], [place], [place] [address]    Report Status 12/02/2017 FINAL  Final  Culture, blood (routine x 2)     Status: None (Preliminary result)   Collection Time: 12/01/17 11:40 AM  Result Value Ref Range Status   Specimen Description BLOOD LEFT ANTECUBITAL  Final   Special Requests   Final    BOTTLES DRAWN AEROBIC ONLY Blood Culture adequate volume   Culture   Final    NO GROWTH 2 DAYS Performed at [facility], [place] [address] [place], [place] [address]    Report Status PENDING  Incomplete    Radiology Reports Dg Orthopantogram  Result Date: 11/26/2017 CLINICAL DATA:  Bacteremia. EXAM: ORTHOPANTOGRAM/PANORAMIC COMPARISON:  None. FINDINGS: Periapical lucency is seen involving a left mandibular premolar and remaining right mandibular molars. IMPRESSION: Periapical lucency involving both remain right mandibular molars and left mandibular premolar. Consider dental referral and dedicated dental radiographs for further evaluation. Electronically Signed   By: Earle Gell M.D.   On: 11/26/2017 19:44   Dg Chest 2 View  Result Date: 12/01/2017 CLINICAL DATA:  84 year old male with bacteremia. PICC line placement. EXAM: CHEST  2 VIEW COMPARISON:  11/28/2017 and earlier. FINDINGS: AP and lateral views of the chest. Stable right upper extremity approach PICC line. Stable mild cardiomegaly and other mediastinal contours. Calcified aortic atherosclerosis. Visualized tracheal air column is within normal limits. No pneumothorax. Confluent lower lobe pulmonary opacity which could be bilateral or in the left lower lobe. Possible small pleural effusion(s). No pulmonary edema. No other confluent opacity. No acute osseous abnormality identified. Negative visible bowel gas pattern. IMPRESSION: 1. Lower lobe pneumonia suspected, perhaps bilateral. Small  associated pleural effusions not excluded. 2. Stable right PICC line. Electronically Signed   By: Genevie Ann M.D.   On: 12/01/2017 10:44   Dg Chest 2 View  Result Date: 11/22/2017 CLINICAL DATA:  Sepsis EXAM: CHEST  2 VIEW COMPARISON:  10/31/2017 FINDINGS: The heart is mildly enlarged but stable. There is moderate tortuosity and calcification of the thoracic aorta. Chronic bronchitic and emphysematous lung changes but no definite infiltrates, edema or effusions. The bony thorax is intact. IMPRESSION: Mild stable cardiac enlargement. Chronic emphysematous and bronchitic lung changes without acute overlying pulmonary process. Electronically Signed   By: Marijo Sanes M.D.   On: 11/22/2017 21:21   Ct Head Wo Contrast  Result Date: 11/27/2017 CLINICAL DATA:  Bacteremia.  Generalized weakness.  Confusion. EXAM: CT HEAD WITHOUT CONTRAST TECHNIQUE: Contiguous axial images were obtained from the base of the skull through the vertex without intravenous contrast. COMPARISON:  10/31/2017 FINDINGS: Brain: Similar findings of advanced atrophy with sulcal prominence and prominence of the bifrontal extra-axial spaces. Minimal periventricular hypodensities compatible microvascular ischemic disease. No CT evidence of acute large territory infarct. No intraparenchymal or extra-axial  mass or hemorrhage. Unchanged size and configuration of the ventricles and the basilar cisterns. No midline shift. Vascular: Intracranial atherosclerosis. Skull: No displaced calvarial fracture. Chronic leftward deviation of the nasal bones. Sinuses/Orbits: Paranasal sinuses and mastoid air cells are normally aerated. No air-fluid levels. Debris is seen within the bilateral external auditory canals. Post bilateral cataract surgery. Other: Regional soft tissues appear normal. IMPRESSION: Similar findings of microvascular ischemic disease and markedly advanced atrophy without superimposed acute intracranial process. Electronically Signed   By: Sandi Mariscal M.D.   On: 11/27/2017 09:23   Dg Chest Port 1 View  Result Date: 11/28/2017 CLINICAL DATA:  Evaluate for PICC line placement EXAM: PORTABLE CHEST 1 VIEW COMPARISON:  11/22/2017 FINDINGS: Right PICC tip projects in the lower superior vena cava at the level of the caval atrial junction. There is lung base opacity that has increased when compared to the prior study, more notable on the right. This is likely a combination of small effusions with either atelectasis or infiltrate. There is no pulmonary edema. Heart is top-normal in size.  No mediastinal or hilar masses. No pneumothorax. IMPRESSION: 1. Right PICC tip projects in the lower superior vena cava at the level of the caval atrial junction. 2. Increased lung base opacity since prior study more notable on the right. This is likely combination of small effusions with atelectasis or pneumonia. No evidence of pulmonary edema. Electronically Signed   By: Lajean Manes M.D.   On: 11/28/2017 18:22    Lab Data:  CBC: Recent Labs  Lab 11/30/17 0625 12/01/17 0432 12/02/17 0429 12/03/17 0435 12/04/17 0434  WBC 8.7 17.5* 18.4* 17.1* 10.8*  HGB 9.4* 10.0* 10.4* 9.9* 9.4*  HCT 27.5* 28.6* 30.4* 29.7* 27.8*  MCV 95.2 95.7 95.6 95.8 95.5  PLT 109* 125* 134* 138* 573*   Basic Metabolic Panel: Recent Labs  Lab 11/29/17 0221 11/30/17 0625 12/01/17 0432 12/02/17 0429 12/03/17 0435  NA 139 139 138 136 134*  K 3.9 3.6 3.5 3.7 3.4*  CL 109 111 110 110 108  CO2 22 21* 21* 18* 19*  GLUCOSE 96 104* 94 97 103*  BUN 17 16 19  29* 34*  CREATININE 0.96 0.90 0.95 1.04 1.07  CALCIUM 8.3* 8.3* 8.1* 8.0* 7.9*   GFR: Estimated Creatinine Clearance: 43.1 mL/min (by C-G formula based on SCr of 1.07 mg/dL). Liver Function Tests: No results for input(s): AST, ALT, ALKPHOS, BILITOT, PROT, ALBUMIN in the last 168 hours. No results for input(s): LIPASE, AMYLASE in the last 168 hours. No results for input(s): AMMONIA in the last 168 hours. Coagulation  Profile: Recent Labs  Lab 11/30/17 0625 12/01/17 0432 12/02/17 0429 12/03/17 0435 12/04/17 0434  INR 2.57 2.85 3.32 1.95 1.46   Cardiac Enzymes: No results for input(s): CKTOTAL, CKMB, CKMBINDEX, TROPONINI in the last 168 hours. BNP (last 3 results) No results for input(s): PROBNP in the last 8760 hours. HbA1C: No results for input(s): HGBA1C in the last 72 hours. CBG: No results for input(s): GLUCAP in the last 168 hours. Lipid Profile: No results for input(s): CHOL, HDL, LDLCALC, TRIG, CHOLHDL, LDLDIRECT in the last 72 hours. Thyroid Function Tests: No results for input(s): TSH, T4TOTAL, FREET4, T3FREE, THYROIDAB in the last 72 hours. Anemia Panel: No results for input(s): VITAMINB12, FOLATE, FERRITIN, TIBC, IRON, RETICCTPCT in the last 72 hours. Urine analysis:    Component Value Date/Time   COLORURINE AMBER (A) 12/01/2017 0825   APPEARANCEUR CLOUDY (A) 12/01/2017 0825   LABSPEC 1.023 12/01/2017 0825   PHURINE  5.0 12/01/2017 0825   GLUCOSEU NEGATIVE 12/01/2017 0825   HGBUR SMALL (A) 12/01/2017 0825   BILIRUBINUR NEGATIVE 12/01/2017 0825   KETONESUR 5 (A) 12/01/2017 0825   PROTEINUR 100 (A) 12/01/2017 0825   NITRITE NEGATIVE 12/01/2017 0825   LEUKOCYTESUR TRACE (A) 12/01/2017 0825     Aryahi Denzler M.D. [facility] 12/04/2017, 10:07 AM  Pager: [phone] Between 7am to 7pm - call Pager - [phone]  After 7pm go to [email or web address] - password [email or web address]  Call night coverage person covering after 7pm

## 2017-12-05 LAB — CBC
HEMATOCRIT: 29.6 % — AB (ref 39.0–52.0)
Hemoglobin: 10.1 g/dL — ABNORMAL LOW (ref 13.0–17.0)
MCH: 32.5 pg (ref 26.0–34.0)
MCHC: 34.1 g/dL (ref 30.0–36.0)
MCV: 95.2 fL (ref 78.0–100.0)
PLATELETS: 156 10*3/uL (ref 150–400)
RBC: 3.11 MIL/uL — ABNORMAL LOW (ref 4.22–5.81)
RDW: 15.7 % — ABNORMAL HIGH (ref 11.5–15.5)
WBC: 9.8 10*3/uL (ref 4.0–10.5)

## 2017-12-05 LAB — PROTIME-INR
INR: 1.43
Prothrombin Time: 17.3 seconds — ABNORMAL HIGH (ref 11.4–15.2)

## 2017-12-05 LAB — HEPARIN LEVEL (UNFRACTIONATED)
Heparin Unfractionated: 0.73 IU/mL — ABNORMAL HIGH (ref 0.30–0.70)
Heparin Unfractionated: 0.65 IU/mL (ref 0.30–0.70)

## 2017-12-05 MED ORDER — HALOPERIDOL LACTATE 5 MG/ML IJ SOLN
2.0000 mg | Freq: Four times a day (QID) | INTRAMUSCULAR | Status: DC | PRN
  Administered 2017-12-05: 2 mg via INTRAVENOUS
  Filled 2017-12-05: qty 1

## 2017-12-05 NOTE — Progress Notes (Signed)
ANTICOAGULATION CONSULT NOTE - Follow Up Consult  Pharmacy Consult for Heparin (warfarin on hold) Indication: atrial fibrillation  Allergies  Allergen Reactions  . Adhesive [Tape] Other (See Comments)    THE PATIENT'S SKIN IS VERY THIN AND WILL TEAR AND BRUISE VERY EASILY!!  . Sunflower Oil Itching  . Penicillins Rash    Has patient had a PCN reaction causing immediate rash, facial/tongue/throat swelling, SOB or lightheadedness with hypotension: Yes Has patient had a PCN reaction causing severe rash involving mucus membranes or skin necrosis: Unk Has patient had a PCN reaction that required hospitalization: No Has patient had a PCN reaction occurring within the last 10 years: No If all of the above answers are "NO", then may proceed with Cephalosporin use.     Patient Measurements: Height: 5\' 6"  (167.6 cm) Weight: 148 lb 2.4 oz (67.2 kg) IBW/kg (Calculated) : 63.8  Vital Signs: Temp: 98 F (36.7 C) (03/03 0428) Temp Source: Oral (03/03 0428) BP: 91/60 (03/03 1000) Pulse Rate: 101 (03/03 0428)  Labs: Recent Labs    12/03/17 0435  12/04/17 0434 12/04/17 1722 12/05/17 0302 12/05/17 1512  HGB 9.9*  --  9.4*  --  10.1*  --   HCT 29.7*  --  27.8*  --  29.6*  --   PLT 138*  --  136*  --  156  --   LABPROT 22.0*  --  17.6*  --  17.3*  --   INR 1.95  --  1.46  --  1.43  --   HEPARINUNFRC  --    < > 0.21* 0.28* 0.73* 0.65  CREATININE 1.07  --   --   --   --   --    < > = values in this interval not displayed.    Estimated Creatinine Clearance: 43.1 mL/min (by C-G formula based on SCr of 1.07 mg/dL).   Assessment: 82 YOM with history of AFib (CHADSVASC = 3) on warfarin PTA. Originally warfarin was continued here, however it is now on hold due to need for dental extractions. Last dose of warfarin was given in the evening on 2/25. On heparin bridge.  INR now down to 1.43. CBC stable. Heparin level now therapeutic after rate decrease. No bleed documented.  Goal of Therapy:   Heparin level 0.3-0.7 units/ml Monitor platelets by anticoagulation protocol: Yes   Plan:  Continue heparin at 1300 units/hr Confirm therapeutic with AM labs Daily heparin level/CBC Monitor for s/sx bleeding  Elicia Lamp, PharmD, BCPS Clinical Pharmacist 12/05/2017 4:18 PM

## 2017-12-05 NOTE — Progress Notes (Signed)
Error

## 2017-12-05 NOTE — Progress Notes (Signed)
ANTICOAGULATION CONSULT NOTE - Follow Up Consult  Pharmacy Consult for Heparin (warfarin on hold) Indication: atrial fibrillation  Allergies  Allergen Reactions  . Adhesive [Tape] Other (See Comments)    THE PATIENT'S SKIN IS VERY THIN AND WILL TEAR AND BRUISE VERY EASILY!!  . Sunflower Oil Itching  . Penicillins Rash    Has patient had a PCN reaction causing immediate rash, facial/tongue/throat swelling, SOB or lightheadedness with hypotension: Yes Has patient had a PCN reaction causing severe rash involving mucus membranes or skin necrosis: Unk Has patient had a PCN reaction that required hospitalization: No Has patient had a PCN reaction occurring within the last 10 years: No If all of the above answers are "NO", then may proceed with Cephalosporin use.     Patient Measurements: Height: 5\' 6"  (167.6 cm) Weight: 148 lb 2.4 oz (67.2 kg) IBW/kg (Calculated) : 63.8  Vital Signs:    Labs: Recent Labs    12/02/17 0429 12/03/17 0435  12/04/17 0434 12/04/17 1722 12/05/17 0302  HGB 10.4* 9.9*  --  9.4*  --  10.1*  HCT 30.4* 29.7*  --  27.8*  --  29.6*  PLT 134* 138*  --  136*  --  156  LABPROT 33.4* 22.0*  --  17.6*  --  17.3*  INR 3.32 1.95  --  1.46  --  1.43  HEPARINUNFRC  --   --    < > 0.21* 0.28* 0.73*  CREATININE 1.04 1.07  --   --   --   --    < > = values in this interval not displayed.    Estimated Creatinine Clearance: 43.1 mL/min (by C-G formula based on SCr of 1.07 mg/dL).   Assessment: 19 YOM with history of AFib (CHADSVASC = 3) on warfarin PTA. Originally warfarin was continued here, however it is now on hold due to need for dental extractions. Last dose of warfarin was given in the evening on 2/25.  INR is now subtherapeutic at 1.95 s/p vitamin K 2.5 mg so will begin heparin drip. Hgb low stable in 9-10s, platelets are low. No overt bleeding noted.  Update 3/3 AM: heparin level elevated this AM, no issues per RN.  Goal of Therapy:  Heparin level  0.3-0.7 units/ml Monitor platelets by anticoagulation protocol: Yes   Plan:  Dec heparin to 1300 units/hr [place], PharmD, BCPS Clinical Pharmacist Phone: [phone]

## 2017-12-05 NOTE — Progress Notes (Signed)
Son, Peter Moreno, present and inquiring about dental surgery. Put a call into Dr. Enrique Sack based out of [facility] [phone] and left message for clarification on when and what. No consent prepared yet. Scott's understanding was that we were moving forward with the procedure tomorrow.

## 2017-12-05 NOTE — Progress Notes (Signed)
[facility]                                                                              Patient Demographics  Peter Moreno, is a 84 y.o. male, DOB - [birth year], OFB:[id number]  Admit date - 11/22/2017   Admitting Physician Ivor Costa, MD  Outpatient Primary MD for the patient is Cyndy Freeze, MD  Outpatient specialists:   LOS - 12  days   Medical records reviewed and are as summarized below:    Chief Complaint  Patient presents with  . Weakness       Brief summary   Peter Davisis a 84 y.o.malewith medical history significant ofhypertension, atrial fibrillation on Coumadin, skin cancer, dementia, gout,who presents with difficulty urinating and urge for urination. Admitted and found to have sepsis secondary to strep viridans bacteremia suspect secondary to dental caries. Dentistry consulted and also following.    Assessment & Plan    Principal Problem:  Sepsis due to Streptococcal bacteremia, UTI, C. difficile colitis - Sepsis physiology resolved, suspect poor dental caries as a source of bacteremia - Blood cultures 2/20 was positive for Streptococcus viridans, continue IV ceftriaxone - Repeat blood cultures 2/27 negative so far.  - Orthopantogram showed periapical lucency involving both main) debridement of the left mandibular premolars - Dr. Lawana Chambers (dentistry) was consulted and family would like to proceed with tooth extractions, plan on 3/4 in am - Per ID, Dr. Graylon Good (note on 2/25), continue ceftriaxone 2 g IV daily for 28 days, end date March 22nd, weekly CBC, BMP  Active Problems: Chronic atrial fibrillation - Rate controlled, continue Cardizem - [place] VASC 3, Coumadin held for dental surgery on 3/4, on heparin drip    Essential hypertension - Presented with hypotension due to sepsis, responded well to IV fluids  C. difficile colitis - Still has a rectal tube, continue oral vancomycin  ? UTI - UA positive for UTI however urine  culture showed no growth, currently on IV Rocephin  Chronic normocytic anemia -H&H stable  Dementia - Currently stable, continue Aricept   Code Status: DO NOT RESUSCITATE DVT Prophylaxis:  Heparin drip Family Communication: Discussed in detail with the patient, all imaging results, lab results explained to the patient  Disposition Plan: Will likely need skilled nursing facility after dental surgery Time Spent in minutes  25 minutes  Procedures:  None  Consultants:   Dental medicine Infectious disease  Antimicrobials:   Oral vancomycin  IV ceftriaxone   Medications  Scheduled Meds: . allopurinol  300 mg Oral Daily  . chlorhexidine  15 mL Mouth Rinse BID  . cholecalciferol  1,000 Units Oral Daily  . diltiazem  120 mg Oral Daily  . donepezil  10 mg Oral QHS  . erythromycin  1 application Both Eyes Daily  . feeding supplement (ENSURE ENLIVE)  237 mL Oral BID BM  . mouth rinse  15 mL Mouth Rinse q12n4p  . midodrine  10 mg Oral TID WC  . multivitamin with minerals  1 tablet Oral Q breakfast  . sodium chloride flush  10-40 mL Intracatheter Q12H  . tamsulosin  0.4 mg Oral Daily  . vancomycin  125 mg Oral QID   Continuous Infusions: . sodium chloride 1,000 mL (12/05/17 0258)  . cefTRIAXone (ROCEPHIN)  IV Stopped (12/04/17 1300)  . heparin 1,300 Units/hr (12/05/17 0422)   PRN Meds:.acetaminophen **OR** acetaminophen, ondansetron **OR** ondansetron (ZOFRAN) IV, polyethylene glycol, sodium chloride flush, sodium chloride flush, zolpidem   Antibiotics   Anti-infectives (From admission, onward)   Start     Dose/Rate Route Frequency Ordered Stop   12/01/17 1130  vancomycin (VANCOCIN) 50 mg/mL oral solution 125 mg     125 mg Oral 4 times daily 12/01/17 1045 12/11/17 0959   11/24/17 2200  vancomycin (VANCOCIN) IVPB 750 mg/150 ml premix  Status:  Discontinued     750 mg 150 mL/hr over 60 Minutes Intravenous Every 24 hours 11/23/17 2101 11/23/17 2105   11/24/17 2000   levofloxacin (LEVAQUIN) IVPB 750 mg  Status:  Discontinued     750 mg 100 mL/hr over 90 Minutes Intravenous Every 48 hours 11/22/17 2136 11/23/17 0038   11/24/17 1200  cefTRIAXone (ROCEPHIN) 2 g in sodium chloride 0.9 % 100 mL IVPB     2 g 200 mL/hr over 30 Minutes Intravenous Every 24 hours 11/24/17 1031     11/23/17 2200  vancomycin (VANCOCIN) IVPB 750 mg/150 ml premix  Status:  Discontinued     750 mg 150 mL/hr over 60 Minutes Intravenous Every 24 hours 11/22/17 2136 11/23/17 0038   11/23/17 2200  vancomycin (VANCOCIN) IVPB 750 mg/150 ml premix  Status:  Discontinued     750 mg 150 mL/hr over 60 Minutes Intravenous Every 24 hours 11/23/17 2105 11/24/17 1031   11/23/17 2115  vancomycin (VANCOCIN) IVPB 1000 mg/200 mL premix  Status:  Discontinued     1,000 mg 200 mL/hr over 60 Minutes Intravenous  Once 11/23/17 2101 11/23/17 2105   11/23/17 0600  aztreonam (AZACTAM) 1 g in sodium chloride 0.9 % 100 mL IVPB  Status:  Discontinued     1 g 200 mL/hr over 30 Minutes Intravenous Every 8 hours 11/22/17 2136 11/24/17 1031   11/22/17 2000  levofloxacin (LEVAQUIN) IVPB 750 mg     750 mg 100 mL/hr over 90 Minutes Intravenous  Once 11/22/17 1958 11/22/17 2315   11/22/17 2000  aztreonam (AZACTAM) 2 g in sodium chloride 0.9 % 100 mL IVPB     2 g 200 mL/hr over 30 Minutes Intravenous  Once 11/22/17 1958 11/23/17 0022   11/22/17 2000  vancomycin (VANCOCIN) IVPB 1000 mg/200 mL premix     1,000 mg 200 mL/hr over 60 Minutes Intravenous  Once 11/22/17 1958 11/23/17 0139        Subjective:   Peter Moreno was seen and examined today. Appears to be somewhat confused, no fevers or chills. No acute issues overnight. No dizziness, chest pain, abdominal pain.   Objective:   Vitals:   12/04/17 1435 12/04/17 2130 12/05/17 0428 12/05/17 1000  BP: (!) 89/63 110/80 108/60 91/60  Pulse:  80 (!) 101   Resp: 18 18 18    Temp: 98.2 F (36.8 C) 98.3 F (36.8 C) 98 F (36.7 C)   TempSrc: Oral Oral Oral     SpO2:  98% 100%   Weight:      Height:        Intake/Output Summary (Last 24 hours) at 12/05/2017 1203 Last data filed at 12/05/2017 0425 Gross per 24 hour  Intake 1219.27 ml  Output -  Net 1219.27 ml     Wt Readings from Last 3 Encounters:  12/01/17 67.2 kg (  148 lb 2.4 oz)  09/29/17 63 kg (139 lb)  05/25/17 62.6 kg (138 lb)     Exam    General: Alert and oriented x self, NAD  Eyes:   HEENT:   Cardiovascular: S1 S2 auscultated, no rubs, murmurs or gallops. Regular rate and rhythm. No pedal edema b/l  Respiratory: Clear to auscultation bilaterally, no wheezing, rales or rhonchi  Gastrointestinal: Soft, nontender, nondistended, + bowel sounds  Ext: no pedal edema bilaterally  Neuro: no neuro deficits  Musculoskeletal: No digital cyanosis, clubbing  Skin: No rashes  Psych: somewhat confused today   Data Reviewed:  I have personally reviewed following labs and imaging studies  Micro Results Recent Results (from the past 240 hour(s))  Culture, blood (routine x 2)     Status: None   Collection Time: 11/26/17 10:49 AM  Result Value Ref Range Status   Specimen Description BLOOD RIGHT ANTECUBITAL  Final   Special Requests   Final    BOTTLES DRAWN AEROBIC AND ANAEROBIC Blood Culture adequate volume   Culture   Final    NO GROWTH 5 DAYS Performed at [facility], [address], [place], [place] [address]    Report Status 12/01/2017 FINAL  Final  Culture, blood (routine x 2)     Status: None   Collection Time: 11/26/17 10:55 AM  Result Value Ref Range Status   Specimen Description BLOOD LEFT ANTECUBITAL  Final   Special Requests   Final    BOTTLES DRAWN AEROBIC AND ANAEROBIC Blood Culture results may not be optimal due to an excessive volume of blood received in culture bottles   Culture   Final    NO GROWTH 5 DAYS Performed at [facility], [place] [address], [place], [place] [address]    Report Status 12/01/2017 FINAL  Final  Culture, blood  (routine x 2)     Status: None (Preliminary result)   Collection Time: 12/01/17  7:30 AM  Result Value Ref Range Status   Specimen Description BLOOD LEFT ANTECUBITAL  Final   Special Requests IN PEDIATRIC BOTTLE Blood Culture adequate volume  Final   Culture   Final    NO GROWTH 3 DAYS Performed at [facility], [place] [address] [place], [place] [address]    Report Status PENDING  Incomplete  C difficile quick scan w PCR reflex     Status: Abnormal   Collection Time: 12/01/17  8:15 AM  Result Value Ref Range Status   C Diff antigen POSITIVE (A) NEGATIVE Final   C Diff toxin POSITIVE (A) NEGATIVE Final   C Diff interpretation Toxin producing C. difficile detected.  Final    Comment: CRITICAL RESULT CALLED TO, READ BACK BY AND VERIFIED WITH: Donetta Potts RN 10:20 12/01/17 ([email or web address])   Urine Culture     Status: None   Collection Time: 12/01/17 10:22 AM  Result Value Ref Range Status   Specimen Description URINE, CLEAN CATCH  Final   Special Requests NONE  Final   Culture   Final    NO GROWTH Performed at [facility], [address], [place], [place] [address]    Report Status 12/02/2017 FINAL  Final  Culture, blood (routine x 2)     Status: None (Preliminary result)   Collection Time: 12/01/17 11:40 AM  Result Value Ref Range Status   Specimen Description BLOOD LEFT ANTECUBITAL  Final   Special Requests   Final    BOTTLES DRAWN AEROBIC ONLY Blood  Culture adequate volume   Culture   Final    NO GROWTH 3 DAYS Performed at [facility], [place] [address], [place], [place] [address]    Report Status PENDING  Incomplete    Radiology Reports Dg Orthopantogram  Result Date: 11/26/2017 CLINICAL DATA:  Bacteremia. EXAM: ORTHOPANTOGRAM/PANORAMIC COMPARISON:  None. FINDINGS: Periapical lucency is seen involving a left mandibular premolar and remaining right mandibular molars. IMPRESSION: Periapical lucency involving both remain right mandibular molars and left mandibular  premolar. Consider dental referral and dedicated dental radiographs for further evaluation. Electronically Signed   By: Earle Gell M.D.   On: 11/26/2017 19:44   Dg Chest 2 View  Result Date: 12/01/2017 CLINICAL DATA:  84 year old male with bacteremia. PICC line placement. EXAM: CHEST  2 VIEW COMPARISON:  11/28/2017 and earlier. FINDINGS: AP and lateral views of the chest. Stable right upper extremity approach PICC line. Stable mild cardiomegaly and other mediastinal contours. Calcified aortic atherosclerosis. Visualized tracheal air column is within normal limits. No pneumothorax. Confluent lower lobe pulmonary opacity which could be bilateral or in the left lower lobe. Possible small pleural effusion(s). No pulmonary edema. No other confluent opacity. No acute osseous abnormality identified. Negative visible bowel gas pattern. IMPRESSION: 1. Lower lobe pneumonia suspected, perhaps bilateral. Small associated pleural effusions not excluded. 2. Stable right PICC line. Electronically Signed   By: Genevie Ann M.D.   On: 12/01/2017 10:44   Dg Chest 2 View  Result Date: 11/22/2017 CLINICAL DATA:  Sepsis EXAM: CHEST  2 VIEW COMPARISON:  10/31/2017 FINDINGS: The heart is mildly enlarged but stable. There is moderate tortuosity and calcification of the thoracic aorta. Chronic bronchitic and emphysematous lung changes but no definite infiltrates, edema or effusions. The bony thorax is intact. IMPRESSION: Mild stable cardiac enlargement. Chronic emphysematous and bronchitic lung changes without acute overlying pulmonary process. Electronically Signed   By: Marijo Sanes M.D.   On: 11/22/2017 21:21   Ct Head Wo Contrast  Result Date: 11/27/2017 CLINICAL DATA:  Bacteremia.  Generalized weakness.  Confusion. EXAM: CT HEAD WITHOUT CONTRAST TECHNIQUE: Contiguous axial images were obtained from the base of the skull through the vertex without intravenous contrast. COMPARISON:  10/31/2017 FINDINGS: Brain: Similar findings  of advanced atrophy with sulcal prominence and prominence of the bifrontal extra-axial spaces. Minimal periventricular hypodensities compatible microvascular ischemic disease. No CT evidence of acute large territory infarct. No intraparenchymal or extra-axial mass or hemorrhage. Unchanged size and configuration of the ventricles and the basilar cisterns. No midline shift. Vascular: Intracranial atherosclerosis. Skull: No displaced calvarial fracture. Chronic leftward deviation of the nasal bones. Sinuses/Orbits: Paranasal sinuses and mastoid air cells are normally aerated. No air-fluid levels. Debris is seen within the bilateral external auditory canals. Post bilateral cataract surgery. Other: Regional soft tissues appear normal. IMPRESSION: Similar findings of microvascular ischemic disease and markedly advanced atrophy without superimposed acute intracranial process. Electronically Signed   By: Sandi Mariscal M.D.   On: 11/27/2017 09:23   Dg Chest Port 1 View  Result Date: 11/28/2017 CLINICAL DATA:  Evaluate for PICC line placement EXAM: PORTABLE CHEST 1 VIEW COMPARISON:  11/22/2017 FINDINGS: Right PICC tip projects in the lower superior vena cava at the level of the caval atrial junction. There is lung base opacity that has increased when compared to the prior study, more notable on the right. This is likely a combination of small effusions with either atelectasis or infiltrate. There is no pulmonary edema. Heart is top-normal in size.  No mediastinal or  hilar masses. No pneumothorax. IMPRESSION: 1. Right PICC tip projects in the lower superior vena cava at the level of the caval atrial junction. 2. Increased lung base opacity since prior study more notable on the right. This is likely combination of small effusions with atelectasis or pneumonia. No evidence of pulmonary edema. Electronically Signed   By: Lajean Manes M.D.   On: 11/28/2017 18:22    Lab Data:  CBC: Recent Labs  Lab 12/01/17 0432  12/02/17 0429 12/03/17 0435 12/04/17 0434 12/05/17 0302  WBC 17.5* 18.4* 17.1* 10.8* 9.8  HGB 10.0* 10.4* 9.9* 9.4* 10.1*  HCT 28.6* 30.4* 29.7* 27.8* 29.6*  MCV 95.7 95.6 95.8 95.5 95.2  PLT 125* 134* 138* 136* 213   Basic Metabolic Panel: Recent Labs  Lab 11/29/17 0221 11/30/17 0625 12/01/17 0432 12/02/17 0429 12/03/17 0435  NA 139 139 138 136 134*  K 3.9 3.6 3.5 3.7 3.4*  CL 109 111 110 110 108  CO2 22 21* 21* 18* 19*  GLUCOSE 96 104* 94 97 103*  BUN 17 16 19  29* 34*  CREATININE 0.96 0.90 0.95 1.04 1.07  CALCIUM 8.3* 8.3* 8.1* 8.0* 7.9*   GFR: Estimated Creatinine Clearance: 43.1 mL/min (by C-G formula based on SCr of 1.07 mg/dL). Liver Function Tests: No results for input(s): AST, ALT, ALKPHOS, BILITOT, PROT, ALBUMIN in the last 168 hours. No results for input(s): LIPASE, AMYLASE in the last 168 hours. No results for input(s): AMMONIA in the last 168 hours. Coagulation Profile: Recent Labs  Lab 12/01/17 0432 12/02/17 0429 12/03/17 0435 12/04/17 0434 12/05/17 0302  INR 2.85 3.32 1.95 1.46 1.43   Cardiac Enzymes: No results for input(s): CKTOTAL, CKMB, CKMBINDEX, TROPONINI in the last 168 hours. BNP (last 3 results) No results for input(s): PROBNP in the last 8760 hours. HbA1C: No results for input(s): HGBA1C in the last 72 hours. CBG: No results for input(s): GLUCAP in the last 168 hours. Lipid Profile: No results for input(s): CHOL, HDL, LDLCALC, TRIG, CHOLHDL, LDLDIRECT in the last 72 hours. Thyroid Function Tests: No results for input(s): TSH, T4TOTAL, FREET4, T3FREE, THYROIDAB in the last 72 hours. Anemia Panel: No results for input(s): VITAMINB12, FOLATE, FERRITIN, TIBC, IRON, RETICCTPCT in the last 72 hours. Urine analysis:    Component Value Date/Time   COLORURINE AMBER (A) 12/01/2017 0825   APPEARANCEUR CLOUDY (A) 12/01/2017 0825   LABSPEC 1.023 12/01/2017 0825   PHURINE 5.0 12/01/2017 0825   GLUCOSEU NEGATIVE 12/01/2017 0825   HGBUR SMALL  (A) 12/01/2017 0825   BILIRUBINUR NEGATIVE 12/01/2017 0825   KETONESUR 5 (A) 12/01/2017 0825   PROTEINUR 100 (A) 12/01/2017 0825   NITRITE NEGATIVE 12/01/2017 0825   LEUKOCYTESUR TRACE (A) 12/01/2017 0825     Renn Dirocco M.D. [facility] 12/05/2017, 12:03 PM  Pager: [phone] Between 7am to 7pm - call Pager - [phone]  After 7pm go to [email or web address] - password [email or web address]  Call night coverage person covering after 7pm

## 2017-12-06 ENCOUNTER — Encounter (HOSPITAL_COMMUNITY)
Admission: EM | Disposition: A | Discharge status: Hospice | Payer: Self-pay | Source: Home / Self Care | Attending: Internal Medicine

## 2017-12-06 ENCOUNTER — Inpatient Hospital Stay (HOSPITAL_COMMUNITY): Payer: Medicare Other

## 2017-12-06 DIAGNOSIS — S20212A Contusion of left front wall of thorax, initial encounter: Secondary | ICD-10-CM

## 2017-12-06 LAB — CULTURE, BLOOD (ROUTINE X 2)
CULTURE: NO GROWTH
CULTURE: NO GROWTH
SPECIAL REQUESTS: ADEQUATE
Special Requests: ADEQUATE

## 2017-12-06 LAB — CBC
HCT: 25.1 % — ABNORMAL LOW (ref 39.0–52.0)
Hemoglobin: 8.6 g/dL — ABNORMAL LOW (ref 13.0–17.0)
MCH: 32.7 pg (ref 26.0–34.0)
MCHC: 34.3 g/dL (ref 30.0–36.0)
MCV: 95.4 fL (ref 78.0–100.0)
Platelets: 154 10*3/uL (ref 150–400)
RBC: 2.63 MIL/uL — ABNORMAL LOW (ref 4.22–5.81)
RDW: 15.8 % — ABNORMAL HIGH (ref 11.5–15.5)
WBC: 9 10*3/uL (ref 4.0–10.5)

## 2017-12-06 LAB — BLOOD GAS, ARTERIAL
Acid-base deficit: 9.4 mmol/L — ABNORMAL HIGH (ref 0.0–2.0)
Bicarbonate: 13.5 mmol/L — ABNORMAL LOW (ref 20.0–28.0)
FIO2: 21
O2 Saturation: 98.1 %
PATIENT TEMPERATURE: 98.6
pH, Arterial: 7.489 — ABNORMAL HIGH (ref 7.350–7.450)
pO2, Arterial: 92.5 mmHg (ref 83.0–108.0)

## 2017-12-06 LAB — HEPARIN LEVEL (UNFRACTIONATED): HEPARIN UNFRACTIONATED: 0.82 [IU]/mL — AB (ref 0.30–0.70)

## 2017-12-06 LAB — BASIC METABOLIC PANEL
ANION GAP: 5 (ref 5–15)
BUN: 25 mg/dL — ABNORMAL HIGH (ref 6–20)
CO2: 19 mmol/L — AB (ref 22–32)
Calcium: 7.8 mg/dL — ABNORMAL LOW (ref 8.9–10.3)
Chloride: 112 mmol/L — ABNORMAL HIGH (ref 101–111)
Creatinine, Ser: 1.04 mg/dL (ref 0.61–1.24)
GFR calc Af Amer: >60 mL/min (ref >60)
GFR calc non Af Amer: >60 mL/min (ref >60)
GLUCOSE: 106 mg/dL — AB (ref 65–99)
POTASSIUM: 3.7 mmol/L (ref 3.5–5.1)
Sodium: 136 mmol/L (ref 135–145)

## 2017-12-06 LAB — PROTIME-INR
INR: 1.47
PROTHROMBIN TIME: 17.7 s — AB (ref 11.4–15.2)

## 2017-12-06 LAB — HEMOGLOBIN AND HEMATOCRIT, BLOOD
HEMATOCRIT: 18.9 % — AB (ref 39.0–52.0)
Hemoglobin: 6.6 g/dL — CL (ref 13.0–17.0)

## 2017-12-06 LAB — SURGICAL PCR SCREEN
MRSA, PCR: NEGATIVE
Staphylococcus aureus: NEGATIVE

## 2017-12-06 LAB — ABO/RH: ABO/RH(D): O POS

## 2017-12-06 LAB — PREPARE RBC (CROSSMATCH)

## 2017-12-06 SURGERY — MULTIPLE EXTRACTION WITH ALVEOLOPLASTY
Anesthesia: General

## 2017-12-06 MED ORDER — PROTAMINE SULFATE 10 MG/ML IV SOLN
25.0000 mg | INTRAVENOUS | Status: AC
  Administered 2017-12-06: 25 mg via INTRAVENOUS
  Filled 2017-12-06: qty 2.5

## 2017-12-06 MED ORDER — HEPARIN (PORCINE) IN NACL 100-0.45 UNIT/ML-% IJ SOLN
1150.0000 [IU]/h | INTRAMUSCULAR | Status: DC
  Administered 2017-12-06: 1150 [IU]/h via INTRAVENOUS

## 2017-12-06 MED ORDER — METOPROLOL TARTRATE 5 MG/5ML IV SOLN
5.0000 mg | Freq: Once | INTRAVENOUS | Status: AC
  Administered 2017-12-06: 5 mg via INTRAVENOUS
  Filled 2017-12-06: qty 5

## 2017-12-06 MED ORDER — SODIUM CHLORIDE 0.9 % IV SOLN: Freq: Once | INTRAVENOUS | Status: DC

## 2017-12-06 MED ORDER — FUROSEMIDE 10 MG/ML IJ SOLN
20.0000 mg | Freq: Once | INTRAMUSCULAR | Status: AC
  Administered 2017-12-06: 20 mg via INTRAVENOUS
  Filled 2017-12-06: qty 2

## 2017-12-06 MED ORDER — PROTAMINE SULFATE 10 MG/ML IV SOLN
50.0000 mg | INTRAVENOUS | Status: DC
Start: 2017-12-06 — End: 2017-12-06
  Filled 2017-12-06: qty 5

## 2017-12-06 MED ORDER — NALOXONE HCL 0.4 MG/ML IJ SOLN
0.1000 mg | Freq: Once | INTRAMUSCULAR | Status: AC
  Administered 2017-12-06: 0.1 mg via INTRAVENOUS
  Filled 2017-12-06: qty 1

## 2017-12-06 MED ORDER — SODIUM CHLORIDE 0.9 % IV BOLUS (SEPSIS): 1000.0000 mL | Freq: Once | INTRAVENOUS | Status: DC

## 2017-12-06 NOTE — Progress Notes (Signed)
Physical Therapy Treatment Patient Details Name: Peter Moreno MRN: [id number] DOB: 1927/06/06 Today's Date: 12/06/2017    History of Present Illness Pt is a 84 y/o male admitted 11/22/17 with progressive weakness and confusion; found to have UTI with sepsis. Plan for potential tooth extraction surgery on 3/4. PMH significant for skin CA, HTN, a-fib, dementia.    PT Comments    Pt was in a lethargic condition when PT arrived, but nursing was already in and agreed to trying to sit him up. Pt was able to assist a little to side of bed but once PT got him there he resisted PT leaning his head into PT's trunk, then suddenly L side began to have tremors.  Supported him and nurse and PT tried to get him to speak.  Eyes were very fixed, so assisted him back to bed and after about three minutes the pt could speak a little. BP when incident reached the point of being back to bed was 84/54, sat 99% and pulse 94.  Pt became more lethargic and noted sat 85%, pulse up to 138, back to 100% sats with BP reading not registering on L UE.  Nursing had called a rapid response but took a few minutes for the nurse to arrive.  BP was finally ck'd on his L leg with 72/24 reading and sat 74%, back to 100% in a few minutes.  He was very variable for readings which were written on his dry erase board and reported to nurse who arrived for rapid response.  Pt was asleep afterward, O2 sats 100% but pulses up to 125.  Telemetry to verify the pulses and sats.   Follow Up Recommendations  Supervision/Assistance - 24 hour;SNF     Equipment Recommendations  None recommended by PT    Recommendations for Other Services Rehab consult;OT consult     Precautions / Restrictions Precautions Precautions: Fall Precaution Comments: skin breakdown Restrictions Weight Bearing Restrictions: No    Mobility  Bed Mobility Overal bed mobility: Needs Assistance Bed Mobility: Supine to Sit;Sit to Supine Rolling: Mod assist   Supine to  sit: Mod assist Sit to supine: Max assist   General bed mobility comments: pt requires signficant assist using bed pad to help him scoot and support on upper back to sit up but return to bed after incidient of LOC was a swing around on the bed with support under knees and support of trunk  Transfers Overall transfer level: Needs assistance               General transfer comment: did not stand due to incident in which pt had altered LOC  Ambulation/Gait             General Gait Details: unable    Stairs            Wheelchair Mobility    Modified Rankin (Stroke Patients Only)       Balance     Sitting balance-Leahy Scale: Poor       Standing balance-Leahy Scale: Zero                              Cognition Arousal/Alertness: Lethargic Behavior During Therapy: Flat affect Overall Cognitive Status: Impaired/Different from baseline Area of Impairment: Following commands;Safety/judgement;Awareness;Problem solving                     Memory: Decreased short-term memory Following Commands: Follows one step commands with  increased time;Follows one step commands inconsistently Safety/Judgement: Decreased awareness of safety;Decreased awareness of deficits Awareness: Intellectual Problem Solving: Slow processing;Decreased initiation;Difficulty sequencing;Requires verbal cues;Requires tactile cues        Exercises      General Comments        Pertinent Vitals/Pain Pain Assessment: Faces Faces Pain Scale: Hurts a little bit Pain Location: trunk Pain Descriptors / Indicators: Sore Pain Intervention(s): Limited activity within patient's tolerance;Monitored during session;Repositioned    Home Living                      Prior Function            PT Goals (current goals can now be found in the care plan section) Progress towards PT goals: Not progressing toward goals - comment(pt has had a medical decline today wiht  lethargic presentati)    Frequency    Min 3X/week      PT Plan Other (comment)(pt condition needs to be reassessed next visit )    Co-evaluation              AM-PAC PT "6 Clicks" Daily Activity  Outcome Measure  Difficulty turning over in bed (including adjusting bedclothes, sheets and blankets)?: Unable Difficulty moving from lying on back to sitting on the side of the bed? : Unable Difficulty sitting down on and standing up from a chair with arms (e.g., wheelchair, bedside commode, etc,.)?: Unable Help needed moving to and from a bed to chair (including a wheelchair)?: Total Help needed walking in hospital room?: Total Help needed climbing 3-5 steps with a railing? : Total 6 Click Score: 6    End of Session Equipment Utilized During Treatment: Oxygen Activity Tolerance: Patient limited by fatigue;Treatment limited secondary to medical complications (Comment);Other (comment)(rapid response was called) Patient left: in bed;with call bell/phone within reach;with nursing/sitter in room Nurse Communication: Mobility status;Other (comment)(vitals during the visit as nursing was present) PT Visit Diagnosis: Unsteadiness on feet (R26.81);Muscle weakness (generalized) (M62.81)     Time: 3220-2542 PT Time Calculation (min) (ACUTE ONLY): 38 min  Charges:  $Therapeutic Activity: 23-37 mins $Neuromuscular Re-education: 8-22 mins                    G Codes:  Functional Assessment Tool Used: AM-PAC 6 Clicks Basic Mobility     Ramond Dial 12/06/2017, 4:49 PM   Mee Hives, PT MS Acute Rehab Dept. Number: [place] [facility] [place]

## 2017-12-06 NOTE — Progress Notes (Signed)
At [place], this RN received a call from the [facility] that they would be sending someone this morning to pick up the patient to take him to the OR at 0615. RN explained to [name],RN that there were no orders placed for surgery by Dr.Kulinsky and no consent had been signed by the patient's son due to the patient being confused. This RN called Lawana Chambers, DDS 3 times with no response and left a message to call this RN back about obtaining orders for this patient since he is scheduled to have surgery at 0730. Pt is on a heparin drip, this RN called Dr.Fitzgerald (Anesthesiologist) and he said that the heparin drip would need to be turned off in the holding area. This RN called short stay, who referred me to the CRNA Lynnae Sandhoff to let them know there were no surgical orders or orders for consent for the patient. [name],CRNA told me that the surgery would be cancelled until I could talk to the doctor. This RN explained to the CRNA that [name] had been called 3 times with no return phone call.  This RN then called [name],RN in the OR to let him know of the situation, at which that time he was also talking to [facility], CRNA about the patient. At this time awaiting phone call back from [facility]. Will continue to monitor and treat per MD orders.    At 0617-Kulinsky,DDS called the unit and said that he had completed a pharmacy transfer order in which the heparin drip should have been turned off at 0130 on 12/06/17.  RN informed DDS that there was no order to discontinue the heparin drip and no order for consent for the patient's son to sign for the surgery. DDS told RN that the surgery would be cancelled at this time and he would come to see the patient this morning when available.

## 2017-12-06 NOTE — Progress Notes (Signed)
ANTICOAGULATION CONSULT NOTE - Follow Up Consult  Pharmacy Consult for Heparin (warfarin on hold) Indication: atrial fibrillation  Allergies  Allergen Reactions  . Adhesive [Tape] Other (See Comments)    THE PATIENT'S SKIN IS VERY THIN AND WILL TEAR AND BRUISE VERY EASILY!!  . Sunflower Oil Itching  . Penicillins Rash    Has patient had a PCN reaction causing immediate rash, facial/tongue/throat swelling, SOB or lightheadedness with hypotension: Yes Has patient had a PCN reaction causing severe rash involving mucus membranes or skin necrosis: Unk Has patient had a PCN reaction that required hospitalization: No Has patient had a PCN reaction occurring within the last 10 years: No If all of the above answers are "NO", then may proceed with Cephalosporin use.     Patient Measurements: Height: 5\' 6"  (167.6 cm) Weight: 148 lb 2.4 oz (67.2 kg) IBW/kg (Calculated) : 63.8  Vital Signs: Temp: 98.1 F (36.7 C) (03/04 0423) Temp Source: Oral (03/04 0423) BP: 100/62 (03/04 0423) Pulse Rate: 94 (03/04 0423)  Labs: Recent Labs    12/04/17 0434  12/05/17 0302 12/05/17 1512 12/06/17 0629 12/06/17 0929  HGB 9.4*  --  10.1*  --  8.6*  --   HCT 27.8*  --  29.6*  --  25.1*  --   PLT 136*  --  156  --  154  --   LABPROT 17.6*  --  17.3*  --  17.7*  --   INR 1.46  --  1.43  --  1.47  --   HEPARINUNFRC 0.21*   < > 0.73* 0.65  --  0.82*  CREATININE  --   --   --   --  1.04  --    < > = values in this interval not displayed.    Estimated Creatinine Clearance: 44.3 mL/min (by C-G formula based on SCr of 1.04 mg/dL).   Assessment: 89 YOM with history of AFib (CHADSVASC = 3) on warfarin PTA. Originally warfarin was continued here, however it is now on hold due to need for dental extractions. Last dose of warfarin was given in the evening on 2/25. On heparin bridge.  INR now down to 1.47. Hgb down to 8.6, platelets are normal. Heparin level is now supratherapeutic at 0.82 on 1300 units/hr.  No bleeding noted. Heparin levels have been difficult to keep in range.  Goal of Therapy:  Heparin level 0.3-0.7 units/ml Monitor platelets by anticoagulation protocol: Yes   Plan:  Decrease heparin drip to 1150 units/hr 8 hr heparin level and CBC Daily heparin level/CBC Monitor for s/sx bleeding Heparin drip off ~4a on 3/5 for dental extractions   Renold Genta, PharmD, BCPS Clinical Pharmacist Clinical phone for 12/06/2017 until 4p is [phone] After 4p, please call [facility] at [phone] for assistance 12/06/2017 11:09 AM

## 2017-12-06 NOTE — Progress Notes (Signed)
pts son scott signed consent this am for surgery tomorrow. Son stated he will be here in the morning if anything else needs to be signed or handled before surgery tomorrow as scheduled at 0900.

## 2017-12-06 NOTE — Progress Notes (Signed)
Pt was continuously trying to climb out of bed, yanking his foley catheter, and was severely agitated. RN paged [place],NP and she placed order for PRN q6 hour haldol. Dose given at 2329. Green mitts applied for patient safety. Patient resting comfortably at this time. Will continue to monitor and treat per MD orders.

## 2017-12-06 NOTE — Significant Event (Addendum)
Rapid Response Event Note  Overview:  Called to assist with patient with syncopal episode Time Called: 1508 Arrival Time: 1512 Event Type: Hypotension, Neurologic  Initial Focused Assessment:  On arrival patient supine in bed.  Arouses to name -  Answers yes and no but lethargic - skin cool and dry - pale - resps reg and unlabored - bil BS present with few coarse rhonchi - moves all extremities - abd large soft - has rectal tube with foul smelling yellow liquid stool - foley patent to small amount clear urine.  RN reports patient has been somewhat lethargic and sleepy all day.  On exam left chest area with large firm hematoma from below nipple line to clavicle - extends to left axilla area and to sternal border.  Patient complains of soreness when touched.  RN reports no recent injury or procedural stick to that area.  Left shoulder and axillary area to mid upper arm with bruising - but this area is soft.  No reports in chart of such exam.  BP 74/44 HR 124 afib RR 22 O2 sats 96% on RA.  Heparin infusion noted.    Interventions:  Heparin infusion stopped.   NS bolus started via right arm PICC line - site unremarkable. ABG done.  Narcan 0.1 mg IV given per Claiborne Billings RN per order Dr. Tana Coast.   Stat call to Dr. Tana Coast to come see patient. No area of focus for pressure for bleeding - generalized pressure to left chest area with both hands.   BP responding to fluid.  102/54 HR 122 - BP baseline since admission has been 90-100/systolic.  Patient more awake - will grip and release - states he is cold and "don't feel so hot."  Endorses pain with left chest area when touched. ABG 7.49 pCO2 below detectable range pO2 93 Bicarb 13.5.  Dr. Tana Coast to bedside - updated with results.  Stat type and cross match.  Stat CT head and chest done per order - patient tol well.  Stat CBC sent. PRBC's infusion began per [employer].  Rectal temp 97.8.  Family in - patient responding appropriately to them - talking with grandsons - Son present -  updates given.  Patient shivering - warm blankets applied.  Handoff to [employer].    Plan of Care (if not transferred):  2 units PRBC's per order Dr. Tana Coast.  Upgraded to SDU status. To call as needed.   Event Summary: Name of Physician Notified: Dr. Tana Coast at (pta RRT)    at    Outcome: Stayed in room and stabalized  Event End Time: 1730  Quin Hoop

## 2017-12-06 NOTE — Significant Event (Addendum)
Rapid Response Event Note  Follow Up:  Patient was alert when I entered. Neuro intact, able to follow my commands, cognition is at baseline per family. Skin to cool to touch, still somewhat pale skin, patient states he feels much better and has no complaints. Per son and daughter in law, he has improved since receiving the first unit of blood.    HR is 90-100s AF and blood pressures are reading soft SBP in the upper 70s. RN administered 5mg  IV Lopressor at 2024.  + pulse and other VS are stable. Patient is NOT symptomatic of BP currently. Will monitor.   PLAN:   -- RN to check a manual BP now and start 2nd unit of PRBC. -- CBC to be checked one hour after all PRBCs are given. -- RN to follow up with BP if needed.  -- At 0300, I called for update, per RN, BP has improved and HR has also improved, HB is now 8.6 with HCT of 24.7. SBP in the 130s and HR is down in the 90-100s. Per RN, patient's mental status has stayed intact.

## 2017-12-06 NOTE — Progress Notes (Addendum)
[facility]                                                                              Patient Demographics  Peter Moreno, is a 84 y.o. male, DOB - Jan 13, 1927, UVO:[id number]  Admit date - 11/22/2017   Admitting Physician Ivor Costa, MD  Outpatient Primary MD for the patient is Cyndy Freeze, MD  Outpatient specialists:   LOS - 13  days   Medical records reviewed and are as summarized below:    Chief Complaint  Patient presents with  . Weakness       Brief summary   Peter Moreno a 84 y.o.malewith medical history significant ofhypertension, atrial fibrillation on Coumadin, skin cancer, dementia, gout,who presents with difficulty urinating and urge for urination. Admitted and found to have sepsis secondary to strep viridans bacteremia suspect secondary to dental caries. Dentistry consulted and also following.    Assessment & Plan    Principal Problem:  Sepsis due to Streptococcal bacteremia, UTI, C. difficile colitis - Sepsis physiology resolved, suspect poor dental caries as a source of bacteremia - Blood cultures 2/20 was positive for Streptococcus viridans, continue IV ceftriaxone - Repeat blood cultures 2/27 negative so far.  - Orthopantogram showed periapical lucency involving both main) debridement of the left mandibular premolars - Discussed with Dr. Dorothyann Gibbs, plan for tooth extractions in a.m., heparin to be held performed - Per ID, Dr. Graylon Good (note on 2/25), continue ceftriaxone 2 g IV daily for 28 days, end date March 22nd, weekly CBC, BMP  Active Problems: Acute metabolic encephalopathy: - somnolent today and syncopal episode with PT. Had received 2mg  haldol at midnight. BP low in 80's, pin point pupils.  - will give iVF 1L bolus, narcan 0.1mg  x1, stat ABG - DC'ed ambien and haldol   Chronic atrial fibrillation - Rate controlled, continue Cardizem - [place] VASC 3, Coumadin held for dental surgery on 3/4, on heparin drip, to be held  tomorrow a.m. for tooth extraction    Essential hypertension - Presented with hypotension due to sepsis, responded well to IV fluids  C. difficile colitis - Still has a rectal tube, continue oral vancomycin  ? UTI - UA positive for UTI however urine culture showed no growth, currently on IV Rocephin   Dementia - continue Aricept  Addendum: 6:45pm Hemorrhagic shock, acute blood loss anemia, large chest pain hematoma:  I was called around 3:45pm regarding the event with PT. On exam, patient was pale, hypotensive BP in 60's, tachycardiac. On exam, large hematoma on left chest wall radiating towards shoulder.  Heparin gtt was immediately stopped, stat H/H and 2 units of PRBC's ordered with IVF Stat CT head and CT chest was ordered. CT head was negative for any bleed and CT chest showed large chest wall hematoma 14 x 12 x5 cm, subpectoral, bilateral pleural effusions.  - protamine 25mg  IV x 1 given. D/w pharmacy, one dose of protamine should be okay to neutralize the heparin dose that was running. - H/H 6.6/18.9. - repeat H/H after the transfusions. Will need lasix 20mg  IV x1 after the transfusion.       Afib with RVR: likely due  to severe anemia  - will give lopressor 5mg  x1 IV   D/w patient's son Nicki Reaper and other family members at bed side. Guarded condition. On exam, patient is alert and oriented, feels "fine". We discussed about the prognosis, repeat CT chest again in am. Scott requested comfort care if patient is deteriorating further.     Critical care time 51mins         Code Status: DO NOT RESUSCITATE DVT Prophylaxis:  Heparin drip Family Communication: as above  Disposition Plan: Will likely need skilled nursing facility after dental surgery Time Spent in minutes  25 minutes  Procedures:  None  Consultants:   Dental medicine Infectious disease  Antimicrobials:   Oral vancomycin  IV ceftriaxone   Medications  Scheduled Meds: . allopurinol  300 mg Oral Daily   . chlorhexidine  15 mL Mouth Rinse BID  . cholecalciferol  1,000 Units Oral Daily  . diltiazem  120 mg Oral Daily  . donepezil  10 mg Oral QHS  . erythromycin  1 application Both Eyes Daily  . feeding supplement (ENSURE ENLIVE)  237 mL Oral BID BM  . mouth rinse  15 mL Mouth Rinse q12n4p  . midodrine  10 mg Oral TID WC  . multivitamin with minerals  1 tablet Oral Q breakfast  . sodium chloride flush  10-40 mL Intracatheter Q12H  . tamsulosin  0.4 mg Oral Daily  . vancomycin  125 mg Oral QID   Continuous Infusions: . sodium chloride 1,000 mL (12/06/17 0329)  . cefTRIAXone (ROCEPHIN)  IV Stopped (12/06/17 1207)  . heparin 1,150 Units/hr (12/06/17 1136)   PRN Meds:.acetaminophen **OR** acetaminophen, haloperidol lactate, ondansetron **OR** ondansetron (ZOFRAN) IV, polyethylene glycol, sodium chloride flush, sodium chloride flush, zolpidem   Antibiotics   Anti-infectives (From admission, onward)   Start     Dose/Rate Route Frequency Ordered Stop   12/01/17 1130  vancomycin (VANCOCIN) 50 mg/mL oral solution 125 mg     125 mg Oral 4 times daily 12/01/17 1045 12/11/17 0959   11/24/17 2200  vancomycin (VANCOCIN) IVPB 750 mg/150 ml premix  Status:  Discontinued     750 mg 150 mL/hr over 60 Minutes Intravenous Every 24 hours 11/23/17 2101 11/23/17 2105   11/24/17 2000  levofloxacin (LEVAQUIN) IVPB 750 mg  Status:  Discontinued     750 mg 100 mL/hr over 90 Minutes Intravenous Every 48 hours 11/22/17 2136 11/23/17 0038   11/24/17 1200  cefTRIAXone (ROCEPHIN) 2 g in sodium chloride 0.9 % 100 mL IVPB     2 g 200 mL/hr over 30 Minutes Intravenous Every 24 hours 11/24/17 1031     11/23/17 2200  vancomycin (VANCOCIN) IVPB 750 mg/150 ml premix  Status:  Discontinued     750 mg 150 mL/hr over 60 Minutes Intravenous Every 24 hours 11/22/17 2136 11/23/17 0038   11/23/17 2200  vancomycin (VANCOCIN) IVPB 750 mg/150 ml premix  Status:  Discontinued     750 mg 150 mL/hr over 60 Minutes Intravenous  Every 24 hours 11/23/17 2105 11/24/17 1031   11/23/17 2115  vancomycin (VANCOCIN) IVPB 1000 mg/200 mL premix  Status:  Discontinued     1,000 mg 200 mL/hr over 60 Minutes Intravenous  Once 11/23/17 2101 11/23/17 2105   11/23/17 0600  aztreonam (AZACTAM) 1 g in sodium chloride 0.9 % 100 mL IVPB  Status:  Discontinued     1 g 200 mL/hr over 30 Minutes Intravenous Every 8 hours 11/22/17 2136 11/24/17 1031   11/22/17 2000  levofloxacin (LEVAQUIN) IVPB 750 mg     750 mg 100 mL/hr over 90 Minutes Intravenous  Once 11/22/17 1958 11/22/17 2315   11/22/17 2000  aztreonam (AZACTAM) 2 g in sodium chloride 0.9 % 100 mL IVPB     2 g 200 mL/hr over 30 Minutes Intravenous  Once 11/22/17 1958 11/23/17 0022   11/22/17 2000  vancomycin (VANCOCIN) IVPB 1000 mg/200 mL premix     1,000 mg 200 mL/hr over 60 Minutes Intravenous  Once 11/22/17 1958 11/23/17 0139        Subjective:   Peter Moreno was seen and examined today. Somewhat somnolent but easily arousable and appropriately responds to the questions. No fevers or chills. No dizziness, chest pain, abdominal pain.   Objective:   Vitals:   12/05/17 0428 12/05/17 1000 12/05/17 2142 12/06/17 0423  BP: 108/60 91/60 105/60 100/62  Pulse: (!) 101  (!) 104 94  Resp: 18  18 18   Temp: 98 F (36.7 C)  98 F (36.7 C) 98.1 F (36.7 C)  TempSrc: Oral  Oral Oral  SpO2: 100%  98% 98%  Weight:      Height:        Intake/Output Summary (Last 24 hours) at 12/06/2017 1339 Last data filed at 12/06/2017 9622 Gross per 24 hour  Intake 2426.13 ml  Output 350 ml  Net 2076.13 ml     Wt Readings from Last 3 Encounters:  12/01/17 67.2 kg (148 lb 2.4 oz)  09/29/17 63 kg (139 lb)  05/25/17 62.6 kg (138 lb)     Exam   General: Keeping his eyes closed but oriented and arousable, responds to questions  Eyes:   HEENT:  Dental caries +  Cardiovascular: S1 S2 auscultated, Regular rate and rhythm. No pedal edema b/l  Respiratory: Clear to auscultation  bilaterally, no wheezing, rales or rhonchi  Gastrointestinal: Soft, nontender, nondistended, + bowel sounds  Ext: no pedal edema bilaterally  Neuro: no new deficits  Musculoskeletal: No digital cyanosis, clubbing  Skin: No rashes  Psych: has dementia  GU: Rectal tube   Data Reviewed:  I have personally reviewed following labs and imaging studies  Micro Results Recent Results (from the past 240 hour(s))  Culture, blood (routine x 2)     Status: None   Collection Time: 12/01/17  7:30 AM  Result Value Ref Range Status   Specimen Description BLOOD LEFT ANTECUBITAL  Final   Special Requests IN PEDIATRIC BOTTLE Blood Culture adequate volume  Final   Culture   Final    NO GROWTH 5 DAYS Performed at [facility], [address] [place], [place] [address]    Report Status 12/06/2017 FINAL  Final  C difficile quick scan w PCR reflex     Status: Abnormal   Collection Time: 12/01/17  8:15 AM  Result Value Ref Range Status   C Diff antigen POSITIVE (A) NEGATIVE Final   C Diff toxin POSITIVE (A) NEGATIVE Final   C Diff interpretation Toxin producing C. difficile detected.  Final    Comment: CRITICAL RESULT CALLED TO, READ BACK BY AND VERIFIED WITH: Donetta Potts RN 10:20 12/01/17 ([email or web address])   Urine Culture     Status: None   Collection Time: 12/01/17 10:22 AM  Result Value Ref Range Status   Specimen Description URINE, CLEAN CATCH  Final   Special Requests NONE  Final   Culture   Final    NO GROWTH Performed at [facility], [address] [place],  [place] [address]    Report Status 12/02/2017 FINAL  Final  Culture, blood (routine x 2)     Status: None   Collection Time: 12/01/17 11:40 AM  Result Value Ref Range Status   Specimen Description BLOOD LEFT ANTECUBITAL  Final   Special Requests   Final    BOTTLES DRAWN AEROBIC ONLY Blood Culture adequate volume   Culture   Final    NO GROWTH 5 DAYS Performed at [facility], [place] [address] [place], [place]  [address]    Report Status 12/06/2017 FINAL  Final  Surgical pcr screen     Status: None   Collection Time: 12/06/17  5:41 AM  Result Value Ref Range Status   MRSA, PCR NEGATIVE NEGATIVE Final   Staphylococcus aureus NEGATIVE NEGATIVE Final    Comment: (NOTE) The Xpert SA Assay (FDA approved for NASAL specimens in patients 61 years of age and older), is one component of a comprehensive surveillance program. It is not intended to diagnose infection nor to guide or monitor treatment. Performed at [facility], [place] [address], [place], [place] [address]     Radiology Reports Dg Orthopantogram  Result Date: 11/26/2017 CLINICAL DATA:  Bacteremia. EXAM: ORTHOPANTOGRAM/PANORAMIC COMPARISON:  None. FINDINGS: Periapical lucency is seen involving a left mandibular premolar and remaining right mandibular molars. IMPRESSION: Periapical lucency involving both remain right mandibular molars and left mandibular premolar. Consider dental referral and dedicated dental radiographs for further evaluation. Electronically Signed   By: Earle Gell M.D.   On: 11/26/2017 19:44   Dg Chest 2 View  Result Date: 12/01/2017 CLINICAL DATA:  84 year old male with bacteremia. PICC line placement. EXAM: CHEST  2 VIEW COMPARISON:  11/28/2017 and earlier. FINDINGS: AP and lateral views of the chest. Stable right upper extremity approach PICC line. Stable mild cardiomegaly and other mediastinal contours. Calcified aortic atherosclerosis. Visualized tracheal air column is within normal limits. No pneumothorax. Confluent lower lobe pulmonary opacity which could be bilateral or in the left lower lobe. Possible small pleural effusion(s). No pulmonary edema. No other confluent opacity. No acute osseous abnormality identified. Negative visible bowel gas pattern. IMPRESSION: 1. Lower lobe pneumonia suspected, perhaps bilateral. Small associated pleural effusions not excluded. 2. Stable right PICC line. Electronically Signed   By: Genevie Ann M.D.   On: 12/01/2017 10:44   Dg Chest 2 View  Result Date: 11/22/2017 CLINICAL DATA:  Sepsis EXAM: CHEST  2 VIEW COMPARISON:  10/31/2017 FINDINGS: The heart is mildly enlarged but stable. There is moderate tortuosity and calcification of the thoracic aorta. Chronic bronchitic and emphysematous lung changes but no definite infiltrates, edema or effusions. The bony thorax is intact. IMPRESSION: Mild stable cardiac enlargement. Chronic emphysematous and bronchitic lung changes without acute overlying pulmonary process. Electronically Signed   By: Marijo Sanes M.D.   On: 11/22/2017 21:21   Ct Head Wo Contrast  Result Date: 11/27/2017 CLINICAL DATA:  Bacteremia.  Generalized weakness.  Confusion. EXAM: CT HEAD WITHOUT CONTRAST TECHNIQUE: Contiguous axial images were obtained from the base of the skull through the vertex without intravenous contrast. COMPARISON:  10/31/2017 FINDINGS: Brain: Similar findings of advanced atrophy with sulcal prominence and prominence of the bifrontal extra-axial spaces. Minimal periventricular hypodensities compatible microvascular ischemic disease. No CT evidence of acute large territory infarct. No intraparenchymal or extra-axial mass or hemorrhage. Unchanged size and configuration of the ventricles and the basilar cisterns. No midline shift. Vascular: Intracranial atherosclerosis. Skull: No displaced calvarial fracture. Chronic leftward deviation of the  nasal bones. Sinuses/Orbits: Paranasal sinuses and mastoid air cells are normally aerated. No air-fluid levels. Debris is seen within the bilateral external auditory canals. Post bilateral cataract surgery. Other: Regional soft tissues appear normal. IMPRESSION: Similar findings of microvascular ischemic disease and markedly advanced atrophy without superimposed acute intracranial process. Electronically Signed   By: Sandi Mariscal M.D.   On: 11/27/2017 09:23   Dg Chest Port 1 View  Result Date: 11/28/2017 CLINICAL DATA:   Evaluate for PICC line placement EXAM: PORTABLE CHEST 1 VIEW COMPARISON:  11/22/2017 FINDINGS: Right PICC tip projects in the lower superior vena cava at the level of the caval atrial junction. There is lung base opacity that has increased when compared to the prior study, more notable on the right. This is likely a combination of small effusions with either atelectasis or infiltrate. There is no pulmonary edema. Heart is top-normal in size.  No mediastinal or hilar masses. No pneumothorax. IMPRESSION: 1. Right PICC tip projects in the lower superior vena cava at the level of the caval atrial junction. 2. Increased lung base opacity since prior study more notable on the right. This is likely combination of small effusions with atelectasis or pneumonia. No evidence of pulmonary edema. Electronically Signed   By: Lajean Manes M.D.   On: 11/28/2017 18:22    Lab Data:  CBC: Recent Labs  Lab 12/02/17 0429 12/03/17 0435 12/04/17 0434 12/05/17 0302 12/06/17 0629  WBC 18.4* 17.1* 10.8* 9.8 9.0  HGB 10.4* 9.9* 9.4* 10.1* 8.6*  HCT 30.4* 29.7* 27.8* 29.6* 25.1*  MCV 95.6 95.8 95.5 95.2 95.4  PLT 134* 138* 136* 156 960   Basic Metabolic Panel: Recent Labs  Lab 11/30/17 0625 12/01/17 0432 12/02/17 0429 12/03/17 0435 12/06/17 0629  NA 139 138 136 134* 136  K 3.6 3.5 3.7 3.4* 3.7  CL 111 110 110 108 112*  CO2 21* 21* 18* 19* 19*  GLUCOSE 104* 94 97 103* 106*  BUN 16 19 29* 34* 25*  CREATININE 0.90 0.95 1.04 1.07 1.04  CALCIUM 8.3* 8.1* 8.0* 7.9* 7.8*   GFR: Estimated Creatinine Clearance: 44.3 mL/min (by C-G formula based on SCr of 1.04 mg/dL). Liver Function Tests: No results for input(s): AST, ALT, ALKPHOS, BILITOT, PROT, ALBUMIN in the last 168 hours. No results for input(s): LIPASE, AMYLASE in the last 168 hours. No results for input(s): AMMONIA in the last 168 hours. Coagulation Profile: Recent Labs  Lab 12/02/17 0429 12/03/17 0435 12/04/17 0434 12/05/17 0302 12/06/17 0629    INR 3.32 1.95 1.46 1.43 1.47   Cardiac Enzymes: No results for input(s): CKTOTAL, CKMB, CKMBINDEX, TROPONINI in the last 168 hours. BNP (last 3 results) No results for input(s): PROBNP in the last 8760 hours. HbA1C: No results for input(s): HGBA1C in the last 72 hours. CBG: No results for input(s): GLUCAP in the last 168 hours. Lipid Profile: No results for input(s): CHOL, HDL, LDLCALC, TRIG, CHOLHDL, LDLDIRECT in the last 72 hours. Thyroid Function Tests: No results for input(s): TSH, T4TOTAL, FREET4, T3FREE, THYROIDAB in the last 72 hours. Anemia Panel: No results for input(s): VITAMINB12, FOLATE, FERRITIN, TIBC, IRON, RETICCTPCT in the last 72 hours. Urine analysis:    Component Value Date/Time   COLORURINE AMBER (A) 12/01/2017 0825   APPEARANCEUR CLOUDY (A) 12/01/2017 0825   LABSPEC 1.023 12/01/2017 0825   PHURINE 5.0 12/01/2017 0825   GLUCOSEU NEGATIVE 12/01/2017 0825   HGBUR SMALL (A) 12/01/2017 0825   BILIRUBINUR NEGATIVE 12/01/2017 0825   KETONESUR 5 (A) 12/01/2017 0825  PROTEINUR 100 (A) 12/01/2017 0825   NITRITE NEGATIVE 12/01/2017 0825   LEUKOCYTESUR TRACE (A) 12/01/2017 0825     Kelis Plasse M.D. [facility] 12/06/2017, 1:39 PM  Pager: [phone] Between 7am to 7pm - call Pager - [phone]  After 7pm go to [email or web address] - password [email or web address]  Call night coverage person covering after 7pm

## 2017-12-07 ENCOUNTER — Encounter (HOSPITAL_COMMUNITY)
Admission: EM | Disposition: A | Discharge status: Hospice | Payer: Self-pay | Source: Home / Self Care | Attending: Internal Medicine

## 2017-12-07 ENCOUNTER — Inpatient Hospital Stay (HOSPITAL_COMMUNITY): Payer: Medicare Other

## 2017-12-07 DIAGNOSIS — S20219A Contusion of unspecified front wall of thorax, initial encounter: Secondary | ICD-10-CM

## 2017-12-07 LAB — CBC
HCT: 24.7 % — ABNORMAL LOW (ref 39.0–52.0)
Hemoglobin: 8.6 g/dL — ABNORMAL LOW (ref 13.0–17.0)
MCH: 30.9 pg (ref 26.0–34.0)
MCHC: 34.8 g/dL (ref 30.0–36.0)
MCV: 88.8 fL (ref 78.0–100.0)
PLATELETS: 125 10*3/uL — AB (ref 150–400)
RBC: 2.78 MIL/uL — ABNORMAL LOW (ref 4.22–5.81)
RDW: 15.8 % — AB (ref 11.5–15.5)
WBC: 13.2 10*3/uL — ABNORMAL HIGH (ref 4.0–10.5)

## 2017-12-07 LAB — BASIC METABOLIC PANEL
Anion gap: 6 (ref 5–15)
BUN: 32 mg/dL — AB (ref 6–20)
CALCIUM: 7.6 mg/dL — AB (ref 8.9–10.3)
CO2: 16 mmol/L — ABNORMAL LOW (ref 22–32)
Chloride: 114 mmol/L — ABNORMAL HIGH (ref 101–111)
Creatinine, Ser: 1.5 mg/dL — ABNORMAL HIGH (ref 0.61–1.24)
GFR calc non Af Amer: 40 mL/min — ABNORMAL LOW (ref >60)
GFR, EST AFRICAN AMERICAN: 46 mL/min — AB (ref >60)
Glucose, Bld: 147 mg/dL — ABNORMAL HIGH (ref 65–99)
Potassium: 4.1 mmol/L (ref 3.5–5.1)
SODIUM: 136 mmol/L (ref 135–145)

## 2017-12-07 LAB — PROTIME-INR
INR: 1.59
PROTHROMBIN TIME: 18.8 s — AB (ref 11.4–15.2)

## 2017-12-07 SURGERY — MULTIPLE EXTRACTION WITH ALVEOLOPLASTY
Anesthesia: General

## 2017-12-07 NOTE — Progress Notes (Signed)
12/07/2017  Patient:            Peter Moreno Date of Birth:  04/17/1927 MRN:                [id number]   BP (!) 114/91 (BP Location: Left Arm)   Pulse 96   Temp 98.9 F (37.2 C) (Oral)   Resp 18   Ht 5\' 6"  (1.676 m)   Wt 148 lb 2.4 oz (67.2 kg)   SpO2 99%   BMI 23.91 kg/m    CBC Latest Ref Rng & Units 12/07/2017 12/06/2017 12/06/2017  WBC 4.0 - 10.5 K/uL 13.2(H) - 9.0  Hemoglobin 13.0 - 17.0 g/dL 8.6(L) 6.6(LL) 8.6(L)  Hematocrit 39.0 - 52.0 % 24.7(L) 18.9(L) 25.1(L)  Platelets 150 - 400 K/uL 125(L) - 154    I was paged by the nurse for [name] this morning at 6:50 AM. Dr. Tana Coast had indicated that the OR procedure should be canceled at this time due to medical instability of the patient. Chart review found the patient had had critical event last evening. This required 2 units of packed red blood cells. Patient was then found to the medically unstable and therefore the operating room procedure was canceled. Dr. Tana Coast to reconsult Dental Medicine once the patient is medically stable for dental extraction procedures. Alternatively, the patient may be referred to an oral surgeon for extraction of tooth numbers 30 and 31 with alveoloplasty as needed as an outpatient.   Lenn Cal, DDS

## 2017-12-07 NOTE — Consult Note (Signed)
[address]       [place] [address]             [phone]        Yaser Featherly [place] Medical Record #[id number] Date of Birth: 11/08/1926  Referring: Dr Tana Coast Primary Care: Cyndy Freeze, MD Primary Cardiologist:No primary care provider on file.  Chief Complaint:    Chief Complaint  Patient presents with  . Weakness    History of Present Illness:     Mr. Peter Moreno is an 84 year old male with a past medical history significant for hypertension, atrial fibrillation on chronic anticoagulation, previous smoker, skin cancer, dementia, gout, who originally presented on 2/18 with complaints of difficulty urinating and an urge for urination.  He was found to have a positive UA at that time and was started on IV antibiotics.  He was diagnosed with streptococcal bacteremia with intermittent sharp tooth pain and there was a dental consultation requested.  It appears that the dental extraction was to take place on December 06, 2017 however was canceled due to the patient's medical instability.  The patient went into rate controlled atrial fibrillation in the 88E 280K with systolic blood  pressure in the upper 70s.  He had also had a syncopal episode the day before.  He was taken off of Coumadin for his planned tooth extraction and started on IV heparin.  A CT of the chest was performed showing evidence of a left chest wall hematoma.  There were also stable large bilateral pleural effusions with associated atelectatic changes.  The IV heparin was stopped immediately and the patient was given 2 units of packed red blood cells.  He was also given protamine 25 mg IV x1.  We are being consulted for possible surgical intervention for his chest wall hematoma.     Current Activity/ Functional Status: ADL's: Lives with son who assists him with his daily tasks.    Zubrod Score: At the time of surgery this patient's most appropriate activity status/level should be described as: []      0    Normal activity, no symptoms []     1    Restricted in physical strenuous activity but ambulatory, able to do out light work []     2    Ambulatory and capable of self care, unable to do work activities, up and about                 more than 50%  Of the time                            []     3    Only limited self care, in bed greater than 50% of waking hours [x]     4    Completely disabled, no self care, confined to bed or chair []     5    Moribund  Past Medical History:  Diagnosis Date  . Atrial fibrillation ([place])   . Hypertension   . Skin cancer     Past Surgical History:  Procedure Laterality Date  . Cataracts      Social History   Tobacco Use  Smoking Status Former Smoker  . Packs/day: 3.00  . Types: Cigarettes  . Start date: 06/22/1949  . Last attempt to quit: 06/22/1969  . Years since quitting: 48.4  Smokeless Tobacco Never Used    Social History   Substance and Sexual  Activity  Alcohol Use Yes   Comment: Occasional    Social History   Socioeconomic History  . Marital status: Widowed    Spouse name: Not on file  . Number of children: 3  . Years of education: Not on file  . Highest education level: Not on file  Social Needs  . Financial resource strain: Not on file  . Food insecurity - worry: Not on file  . Food insecurity - inability: Not on file  . Transportation needs - medical: Not on file  . Transportation needs - non-medical: Not on file  Occupational History  . Not on file  Tobacco Use  . Smoking status: Former Smoker    Packs/day: 3.00    Types: Cigarettes    Start date: 06/22/1949    Last attempt to quit: 06/22/1969    Years since quitting: 48.4  . Smokeless tobacco: Never Used  Substance and Sexual Activity  . Alcohol use: Yes    Comment: Occasional  . Drug use: Not on file  . Sexual activity: Not on file  Other Topics Concern  . Not on file  Social History Narrative  . Not on file    Allergies  Allergen Reactions  . Adhesive  [Tape] Other (See Comments)    THE PATIENT'S SKIN IS VERY THIN AND WILL TEAR AND BRUISE VERY EASILY!!  . Penicillins Rash and Other (See Comments)    PATIENT HAS HAD A PCN REACTION WITH IMMEDIATE RASH, FACIAL/TONGUE/THROAT SWELLING, SOB, OR LIGHTHEADEDNESS WITH HYPOTENSION:  #  #  #  YES  #  #  #   Has patient had a PCN reaction causing severe rash involving mucus membranes or skin necrosis: Unk Has patient had a PCN reaction that required hospitalization: No Has patient had a PCN reaction occurring within the last 10 years: No   . Sunflower Oil Itching    Current Facility-Administered Medications  Medication Dose Route Frequency Provider Last Rate Last Dose  . 0.9 %  sodium chloride infusion   Intravenous Once Rai, Ripudeep K, MD      . acetaminophen (TYLENOL) tablet 650 mg  650 mg Oral Q6H PRN Ivor Costa, MD   650 mg at 12/04/17 1715   Or  . acetaminophen (TYLENOL) suppository 650 mg  650 mg Rectal Q6H PRN Ivor Costa, MD      . allopurinol (ZYLOPRIM) tablet 300 mg  300 mg Oral Daily Ivor Costa, MD   300 mg at 12/07/17 1052  . cefTRIAXone (ROCEPHIN) 2 g in sodium chloride 0.9 % 100 mL IVPB  2 g Intravenous Q24H Reginia Naas, [facility]   Stopped at 12/06/17 1207  . chlorhexidine (PERIDEX) 0.12 % solution 15 mL  15 mL Mouth Rinse BID Cristal Ford, DO   15 mL at 12/07/17 1052  . cholecalciferol (VITAMIN D) tablet 1,000 Units  1,000 Units Oral Daily Ivor Costa, MD   1,000 Units at 12/07/17 1052  . diltiazem (CARDIZEM CD) 24 hr capsule 120 mg  120 mg Oral Daily Cristal Ford, DO   120 mg at 12/07/17 1052  . donepezil (ARICEPT) tablet 10 mg  10 mg Oral QHS Ivor Costa, MD   10 mg at 12/07/17 0033  . erythromycin ophthalmic ointment 1 application  1 application Both Eyes Daily Ivor Costa, MD   1 application at 16/10/96 1052  . feeding supplement (ENSURE ENLIVE) (ENSURE ENLIVE) liquid 237 mL  237 mL Oral BID BM Hall, Carole N, DO   237 mL at 12/07/17 1053  .  MEDLINE mouth rinse  15 mL Mouth  Rinse q12n4p Cristal Ford, DO   15 mL at 12/06/17 1234  . midodrine (PROAMATINE) tablet 10 mg  10 mg Oral TID WC Ivor Costa, MD   10 mg at 12/07/17 0800  . multivitamin with minerals tablet 1 tablet  1 tablet Oral Q breakfast Ivor Costa, MD   1 tablet at 12/07/17 1052  . ondansetron (ZOFRAN) tablet 4 mg  4 mg Oral Q6H PRN Ivor Costa, MD       Or  . ondansetron [facility]) injection 4 mg  4 mg Intravenous Q6H PRN Ivor Costa, MD      . polyethylene glycol (MIRALAX / GLYCOLAX) packet 17 g  17 g Oral Daily PRN Ivor Costa, MD      . sodium chloride 0.9 % bolus 1,000 mL  1,000 mL Intravenous Once Rai, Ripudeep K, MD      . sodium chloride flush (NS) 0.9 % injection 10-40 mL  10-40 mL Intracatheter Q12H Mikhail, [place], DO   10 mL at 12/07/17 1053  . sodium chloride flush (NS) 0.9 % injection 10-40 mL  10-40 mL Intracatheter PRN Mikhail, Velta Addison, DO      . sodium chloride flush (NS) 0.9 % injection 10-40 mL  10-40 mL Intracatheter PRN Mikhail, Velta Addison, DO   20 mL at 12/03/17 0451  . tamsulosin (FLOMAX) capsule 0.4 mg  0.4 mg Oral Daily Cristal Ford, DO   0.4 mg at 12/07/17 1052  . vancomycin (VANCOCIN) 50 mg/mL oral solution 125 mg  125 mg Oral QID Cristal Ford, DO   125 mg at 12/07/17 1052    Medications Prior to Admission  Medication Sig Dispense Refill Last Dose  . allopurinol (ZYLOPRIM) 300 MG tablet Take 300 mg by mouth daily.   11/21/2017 at pm  . Cholecalciferol (VITAMIN D-3) 1000 units CAPS Take 1,000 Units by mouth daily.   11/22/2017 at Unknown time  . diltiazem (CARDIZEM CD) 120 MG 24 hr capsule Take 1 capsule (120 mg total) by mouth daily. Schedule appointment for further refills 30 capsule 12 11/21/2017 at pm  . donepezil (ARICEPT) 10 MG tablet Take 1 tablet (10 mg total) by mouth at bedtime. 30 tablet 3 11/21/2017 at pm  . erythromycin ophthalmic ointment Place 1 application into both eyes daily.   11/21/2017 at Unknown time  . linaclotide (LINZESS) 290 MCG CAPS capsule Take 290 mcg  by mouth daily before breakfast.   11/22/2017 at am  . midodrine (PROAMATINE) 2.5 MG tablet Take 5 mg by mouth 3 (three) times daily.   11/22/2017 at 1200  . Multiple Vitamins-Minerals (CENTRUM SILVER 50+MEN) TABS Take 1 tablet by mouth daily with breakfast.   11/22/2017 at am  . Polyethylene Glycol 3350 (MIRALAX PO) Take 20 g by mouth daily.    11/22/2017 at am  . warfarin (COUMADIN) 5 MG tablet TAKE ONE TO ONE AND ONE-HALF TABLETS DAILY AS DIRECTED BY COUMADIN CLINIC (Patient taking differently: Take 7.5 mg by mouth in the evening on Sun/Tues/Thurs/Sat and 5 mg on Mon/Wed/Fri) 135 tablet 1 11/21/2017 at 1800  . benazepril (LOTENSIN) 5 MG tablet TAKE 1 TABLET DAILY (Patient not taking: Reported on 11/22/2017) 90 tablet 2 Not Taking at Unknown time    Family History  Problem Relation Age of Onset  . Dementia Mother   . Heart disease Unknown        No family history     Review of Systems:   Review of Systems  Constitutional: Positive for malaise/fatigue.  Cardiovascular: Positive for chest pain and leg swelling.  Genitourinary: Positive for frequency and urgency.  Neurological: Positive for weakness.  Psychiatric/Behavioral: Positive for memory loss.   Pertinent items are noted in HPI.      Physical Exam: BP (!) 114/91 (BP Location: Left Arm)   Pulse 96   Temp 98.9 F (37.2 C) (Oral)   Resp 18   Ht 5\' 6"  (1.676 m)   Wt 148 lb 2.4 oz (67.2 kg)   SpO2 99%   BMI 23.91 kg/m    General appearance: alert, cooperative and no distress Resp: clear to auscultation bilaterally Cardio: irregularly irregular GI: soft, non-tender; bowel sounds normal; no masses,  no organomegaly Extremities: 1-2+ pitting pedal edema bilaterally Neurologic: some confusion, periods of lucidity  Decreased breath sounds bilateral Bruising left axillary are , firm swollen aver left chest wall     Diagnostic Studies & Laboratory data:     Recent Radiology Findings:   Ct Head Wo Contrast  Result Date:  12/06/2017 CLINICAL DATA:  Altered level of consciousness. Left chest hematoma. EXAM: CT HEAD WITHOUT CONTRAST TECHNIQUE: Contiguous axial images were obtained from the base of the skull through the vertex without intravenous contrast. COMPARISON:  CT head without contrast 11/27/2017. FINDINGS: Brain: Atrophy and white matter disease are stable. No acute infarct, hemorrhage, or mass lesion is present. The ventricles are proportionate to the degree of atrophy. Brainstem and cerebellum are normal. No significant extra-axial fluid collection is present. Vascular: Atherosclerotic calcifications are present within the cavernous internal carotid arteries and at the left vertebral artery. There is no hyperdense vessel. Skull: Calvarium is intact. No focal lytic or blastic lesions are present. No significant extracranial soft tissue hematoma is present. Sinuses/Orbits: The paranasal sinuses and mastoid air cells are clear. Bilateral lens replacements are present. Globes and orbits are within normal limits. IMPRESSION: 1. Stable atrophy and white matter disease. This likely reflects the sequela of chronic microvascular ischemia. 2. No acute intracranial abnormality or evidence for focal trauma. 3. Atherosclerosis. Electronically Signed   By: San Morelle M.D.   On: 12/06/2017 17:40   Ct Chest Wo Contrast  Result Date: 12/07/2017 CLINICAL DATA:  Follow-up left chest wall hematoma, shortness of breath and weakness EXAM: CT CHEST WITHOUT CONTRAST TECHNIQUE: Multidetector CT imaging of the chest was performed following the standard protocol without IV contrast. COMPARISON:  12/06/2017 FINDINGS: Cardiovascular: Thoracic aorta and its branches demonstrate diffuse vascular calcifications. No aneurysmal dilatation is seen. No significant cardiac enlargement is noted. Coronary calcifications are seen. Right-sided PICC line is again identified and stable. Mediastinum/Nodes: Thoracic inlet is within normal limits. No  significant hilar or mediastinal adenopathy is noted. The esophagus is within normal limits. Lungs/Pleura: Large bilateral pleural effusions are again identified. Some associated atelectatic changes are noted particularly in the lower lobes bilaterally. Scattered calcified granulomas are seen. No focal mass lesion is noted. Upper Abdomen: Ascites is again identified surrounding the liver stable from the prior exam. The previously noted gallstones are less well appreciated on the current study. No new focal abnormality is noted within the liver. Musculoskeletal: There is again noted a large left anterior chest wall hematoma. This measures approximately 12 x 6.5 cm in transverse and AP dimensions and extends for at least 12 cm in the craniocaudad projection. It demonstrates increased liquefaction with a fluid-fluid level within. The overall appearance is relatively stable when compared with the prior exam with the exception of the breakdown of blood products within. Degenerative changes of the thoracic spine  are again seen and stable. IMPRESSION: Persistent and relatively stable left chest wall hematoma as described. There has been some increase in the breakdown of the blood products within the collection. Stable large bilateral pleural effusions with associated atelectatic changes. Changes of prior granulomatous disease. Ascites within the abdomen Aortic Atherosclerosis (ICD10-I70.0). Electronically Signed   By: Inez Catalina M.D.   On: 12/07/2017 10:01   Ct Chest Wo Contrast  Result Date: 12/06/2017 CLINICAL DATA:  Chest wall hematoma. EXAM: CT CHEST WITHOUT CONTRAST TECHNIQUE: Multidetector CT imaging of the chest was performed following the standard protocol without IV contrast. COMPARISON:  Two-view chest x-ray 12/01/2017. FINDINGS: Cardiovascular: The heart size is normal. Atherosclerotic calcifications are present within the coronary arteries. Valvular calcifications are present at the mitral and anti aortic  valves. Atherosclerotic calcifications are present at the aortic arch. A right-sided PICC line is in place. Pulmonary arteries are unremarkable. Mediastinum/Nodes: No significant mediastinal or axillary adenopathy is present. Lungs/Pleura: Large bilateral pleural effusions are present. There is associated atelectasis. No consolidated airspace disease is present. Upper Abdomen: Moderate abdominal ascites present. The gallbladder is distended. Layering gallstones are evident. Vascular calcifications are noted. Musculoskeletal: A left subpectoral chest wall hematoma measures 14 x 12 x 5 cm. Surrounding inflammatory changes are evident. No other soft tissue hemorrhage is present. IMPRESSION: 1. Large chest wall hematoma anteriorly on the left measures 14 x 12 x 5 cm. This appears to be subpectoral. 2. Large bilateral pleural effusions and associated atelectasis. 3. Abdominal ascites. 4. The combination suggests both right and left-sided heart failure. 5. Cholelithiasis without evidence for cholecystitis. 6. Extensive atherosclerotic disease including coronary artery disease. 7.  Aortic Atherosclerosis (ICD10-I70.0). Electronically Signed   By: San Morelle M.D.   On: 12/06/2017 18:00     I have independently reviewed the above radiologic studies.  Recent Lab Findings: Lab Results  Component Value Date   WBC 13.2 (H) 12/07/2017   HGB 8.6 (L) 12/07/2017   HCT 24.7 (L) 12/07/2017   PLT 125 (L) 12/07/2017   GLUCOSE 147 (H) 12/07/2017   ALT 11 (L) 11/22/2017   AST 20 11/22/2017   NA 136 12/07/2017   K 4.1 12/07/2017   CL 114 (H) 12/07/2017   CREATININE 1.50 (H) 12/07/2017   BUN 32 (H) 12/07/2017   CO2 16 (L) 12/07/2017   TSH 1.156 11/23/2017   INR 1.59 12/07/2017      Assessment / Plan:   Complication of anticoagulation with heparin and chest wall hematoma-would agree with current treatment, stopping heparin, reversed anticoagulation, replace with packed red blood cells as necessary.   Patient has been seen and CT scans are reviewed, with his extremely poor medical condition would not entertain the option of general anesthetic.  Most likely the hematoma is primarily infiltrated through the muscle and simple evacuation would not result in much improvement continue to observe. -  Patient has significant bilateral pleural effusions-  Consider bilateral thoracentesis   Grace Isaac MD      [address] [place] [address] Office [phone]   [place]

## 2017-12-07 NOTE — Progress Notes (Signed)
[facility]                                                                              Patient Demographics  Peter Moreno, is a 84 y.o. male, DOB - 1926-11-04, TDV:[id number]  Admit date - 11/22/2017   Admitting Physician Ivor Costa, MD  Outpatient Primary MD for the patient is Cyndy Freeze, MD  Outpatient specialists:   LOS - 14  days   Medical records reviewed and are as summarized below:    Chief Complaint  Patient presents with  . Weakness       Brief summary   Peter Moreno a 84 y.o.malewith medical history significant ofhypertension, atrial fibrillation on Coumadin, skin cancer, dementia, gout,who presents with difficulty urinating and urge for urination. Admitted and found to have sepsis secondary to strep viridans bacteremia suspect secondary to dental caries. Dentistry consulted and also following.    Assessment & Plan    Principal Problem:  Sepsis due to Streptococcal bacteremia, C. difficile colitis - Sepsis physiology resolved, suspect poor dental caries as a source of bacteremia - Blood cultures 2/20 was positive for Streptococcus viridans, continue IV ceftriaxone - Repeat blood cultures 2/27 negative so far.  - Orthopantogram showed periapical lucency involving both main) debridement of the left mandibular premolars -  Per ID, Dr. Graylon Good (note on 2/25), continue ceftriaxone 2 g IV daily for 28 days, end date March 22nd, weekly CBC, BMP  Active Problems: Acute metabolic encephalopathy: - Improved this morning, close to baseline per family at the bedside  Hemorrhagic shock with acute on chronic anemia, chest wall hematoma - On 3/4, patient was found to be hypotensive, tachycardiac, almost syncopal with PT. CT chest 3/4 showed large chest wall hematoma 14 x 12 x5 cm, subpectoral, bilateral pleural effusions. Hemoglobin 6.6. Heparin drip was discontinued. - Shock physiology now resolved, patient received stat IV fluids, 2 units  packed RBC transfusion, protamine 25 mg IV 1 on 3/4 - Repeat CT chest on 3/5 showed persistent and relatively stable left chest wall hematoma. No active bleeding. - Called surgery, recommended CT surgery consult if hematoma can be evacuated. I have consulted CT surgery (Dr Servando Snare, spoke with Thurmond Butts) for any recommendations - Hemoglobin 8.6 today after 2 units packed RBCs.  Chronic atrial fibrillation, with mild RVR due to severe anemia - Rate controlled, continue oral Cardizem - No aspirin or anticoagulation due to large chest wall hematoma    Essential hypertension - BP now stable, continue oral Cardizem   C. difficile colitis - Still has a rectal tube, continue oral vancomycin  Dementia - continue Aricept  Acute kidney injury likely due to severe anemia and hypotension/shock - Patient has received 2 units packed RBC transfusion, will follow BMET in a.m.  Dental caries - Likely the cause of strep bacteremia, dental extraction placed on hold at least for another 24 hours.  Code Status: DO NOT RESUSCITATE DVT Prophylaxis:  SCD Family Communication: Discussed in detail with patient's son and daughter-in-law at the bedside  Disposition Plan: Will likely need skilled nursing facility after dental surgery Time Spent in minutes  25 minutes  Procedures:  CT chest  Consultants:   Dental medicine Infectious disease CT surgery  Antimicrobials:   Oral vancomycin  IV ceftriaxone   Medications  Scheduled Meds: . allopurinol  300 mg Oral Daily  . chlorhexidine  15 mL Mouth Rinse BID  . cholecalciferol  1,000 Units Oral Daily  . diltiazem  120 mg Oral Daily  . donepezil  10 mg Oral QHS  . erythromycin  1 application Both Eyes Daily  . feeding supplement (ENSURE ENLIVE)  237 mL Oral BID BM  . mouth rinse  15 mL Mouth Rinse q12n4p  . midodrine  10 mg Oral TID WC  . multivitamin with minerals  1 tablet Oral Q breakfast  . sodium chloride flush  10-40 mL Intracatheter Q12H    . tamsulosin  0.4 mg Oral Daily  . vancomycin  125 mg Oral QID   Continuous Infusions: . sodium chloride    . cefTRIAXone (ROCEPHIN)  IV Stopped (12/06/17 1207)  . sodium chloride     PRN Meds:.acetaminophen **OR** acetaminophen, ondansetron **OR** ondansetron (ZOFRAN) IV, polyethylene glycol, sodium chloride flush, sodium chloride flush   Antibiotics   Anti-infectives (From admission, onward)   Start     Dose/Rate Route Frequency Ordered Stop   12/01/17 1130  vancomycin (VANCOCIN) 50 mg/mL oral solution 125 mg     125 mg Oral 4 times daily 12/01/17 1045 12/11/17 0959   11/24/17 2200  vancomycin (VANCOCIN) IVPB 750 mg/150 ml premix  Status:  Discontinued     750 mg 150 mL/hr over 60 Minutes Intravenous Every 24 hours 11/23/17 2101 11/23/17 2105   11/24/17 2000  levofloxacin (LEVAQUIN) IVPB 750 mg  Status:  Discontinued     750 mg 100 mL/hr over 90 Minutes Intravenous Every 48 hours 11/22/17 2136 11/23/17 0038   11/24/17 1200  cefTRIAXone (ROCEPHIN) 2 g in sodium chloride 0.9 % 100 mL IVPB     2 g 200 mL/hr over 30 Minutes Intravenous Every 24 hours 11/24/17 1031     11/23/17 2200  vancomycin (VANCOCIN) IVPB 750 mg/150 ml premix  Status:  Discontinued     750 mg 150 mL/hr over 60 Minutes Intravenous Every 24 hours 11/22/17 2136 11/23/17 0038   11/23/17 2200  vancomycin (VANCOCIN) IVPB 750 mg/150 ml premix  Status:  Discontinued     750 mg 150 mL/hr over 60 Minutes Intravenous Every 24 hours 11/23/17 2105 11/24/17 1031   11/23/17 2115  vancomycin (VANCOCIN) IVPB 1000 mg/200 mL premix  Status:  Discontinued     1,000 mg 200 mL/hr over 60 Minutes Intravenous  Once 11/23/17 2101 11/23/17 2105   11/23/17 0600  aztreonam (AZACTAM) 1 g in sodium chloride 0.9 % 100 mL IVPB  Status:  Discontinued     1 g 200 mL/hr over 30 Minutes Intravenous Every 8 hours 11/22/17 2136 11/24/17 1031   11/22/17 2000  levofloxacin (LEVAQUIN) IVPB 750 mg     750 mg 100 mL/hr over 90 Minutes Intravenous   Once 11/22/17 1958 11/22/17 2315   11/22/17 2000  aztreonam (AZACTAM) 2 g in sodium chloride 0.9 % 100 mL IVPB     2 g 200 mL/hr over 30 Minutes Intravenous  Once 11/22/17 1958 11/23/17 0022   11/22/17 2000  vancomycin (VANCOCIN) IVPB 1000 mg/200 mL premix     1,000 mg 200 mL/hr over 60 Minutes Intravenous  Once 11/22/17 1958 11/23/17 0139        Subjective:   Arley Buist was seen and examined today. Feeling alert awake, oriented and better today.  Family at the bedside. BP more stable today. No dizziness, abdominal pain, nausea or vomiting. No shortness of breath.   Objective:   Vitals:   12/06/17 2301 12/07/17 0014 12/07/17 0051 12/07/17 0434  BP: (!) 141/118 (!) 141/118 (!) 137/115 (!) 114/91  Pulse:   98 96  Resp:    18  Temp: 98.2 F (36.8 C) 99.2 F (37.3 C) 98.3 F (36.8 C) 98.9 F (37.2 C)  TempSrc: Axillary Axillary Axillary Oral  SpO2:      Weight:      Height:        Intake/Output Summary (Last 24 hours) at 12/07/2017 1219 Last data filed at 12/07/2017 0051 Gross per 24 hour  Intake 343 ml  Output 175 ml  Net 168 ml     Wt Readings from Last 3 Encounters:  12/01/17 67.2 kg (148 lb 2.4 oz)  09/29/17 63 kg (139 lb)  05/25/17 62.6 kg (138 lb)     Exam   General: Alert and oriented x 3, NAD  Eyes:   HEENT:    Cardiovascular: S1 S2 clear, irregularly irregular  No pedal edema b/l  Respiratory: Decreased breath sounds at the bases. Large indurated area on the left chest wall towards the shoulder and axillary line. Tender, bruising noted   Gastrointestinal: Soft, nontender, nondistended, + bowel sounds  Ext: no pedal edema bilaterally  Neuro:  no new deficits  Musculoskeletal: No digital cyanosis, clubbing  Skin: No rashes  Psych: Normal affect and demeanor, alert and oriented x3    Data Reviewed:  I have personally reviewed following labs and imaging studies  Micro Results Recent Results (from the past 240 hour(s))  Culture, blood  (routine x 2)     Status: None   Collection Time: 12/01/17  7:30 AM  Result Value Ref Range Status   Specimen Description BLOOD LEFT ANTECUBITAL  Final   Special Requests IN PEDIATRIC BOTTLE Blood Culture adequate volume  Final   Culture   Final    NO GROWTH 5 DAYS Performed at [facility], [place] [address], [place], [place] [address]    Report Status 12/06/2017 FINAL  Final  C difficile quick scan w PCR reflex     Status: Abnormal   Collection Time: 12/01/17  8:15 AM  Result Value Ref Range Status   C Diff antigen POSITIVE (A) NEGATIVE Final   C Diff toxin POSITIVE (A) NEGATIVE Final   C Diff interpretation Toxin producing C. difficile detected.  Final    Comment: CRITICAL RESULT CALLED TO, READ BACK BY AND VERIFIED WITH: Donetta Potts RN 10:20 12/01/17 ([email or web address])   Urine Culture     Status: None   Collection Time: 12/01/17 10:22 AM  Result Value Ref Range Status   Specimen Description URINE, CLEAN CATCH  Final   Special Requests NONE  Final   Culture   Final    NO GROWTH Performed at [facility], [address] [place], [place] [address]    Report Status 12/02/2017 FINAL  Final  Culture, blood (routine x 2)     Status: None   Collection Time: 12/01/17 11:40 AM  Result Value Ref Range Status   Specimen Description BLOOD LEFT ANTECUBITAL  Final   Special Requests   Final    BOTTLES DRAWN AEROBIC ONLY Blood Culture adequate volume   Culture   Final    NO GROWTH 5 DAYS Performed at [facility], [place] [address], [place], [place] [address]  Report Status 12/06/2017 FINAL  Final  Surgical pcr screen     Status: None   Collection Time: 12/06/17  5:41 AM  Result Value Ref Range Status   MRSA, PCR NEGATIVE NEGATIVE Final   Staphylococcus aureus NEGATIVE NEGATIVE Final    Comment: (NOTE) The Xpert SA Assay (FDA approved for NASAL specimens in patients 37 years of age and older), is one component of a comprehensive surveillance program. It is not intended to  diagnose infection nor to guide or monitor treatment. Performed at [facility], [place] [address], [place], [place] [address]     Radiology Reports Dg Orthopantogram  Result Date: 11/26/2017 CLINICAL DATA:  Bacteremia. EXAM: ORTHOPANTOGRAM/PANORAMIC COMPARISON:  None. FINDINGS: Periapical lucency is seen involving a left mandibular premolar and remaining right mandibular molars. IMPRESSION: Periapical lucency involving both remain right mandibular molars and left mandibular premolar. Consider dental referral and dedicated dental radiographs for further evaluation. Electronically Signed   By: Earle Gell M.D.   On: 11/26/2017 19:44   Dg Chest 2 View  Result Date: 12/01/2017 CLINICAL DATA:  84 year old male with bacteremia. PICC line placement. EXAM: CHEST  2 VIEW COMPARISON:  11/28/2017 and earlier. FINDINGS: AP and lateral views of the chest. Stable right upper extremity approach PICC line. Stable mild cardiomegaly and other mediastinal contours. Calcified aortic atherosclerosis. Visualized tracheal air column is within normal limits. No pneumothorax. Confluent lower lobe pulmonary opacity which could be bilateral or in the left lower lobe. Possible small pleural effusion(s). No pulmonary edema. No other confluent opacity. No acute osseous abnormality identified. Negative visible bowel gas pattern. IMPRESSION: 1. Lower lobe pneumonia suspected, perhaps bilateral. Small associated pleural effusions not excluded. 2. Stable right PICC line. Electronically Signed   By: Genevie Ann M.D.   On: 12/01/2017 10:44   Dg Chest 2 View  Result Date: 11/22/2017 CLINICAL DATA:  Sepsis EXAM: CHEST  2 VIEW COMPARISON:  10/31/2017 FINDINGS: The heart is mildly enlarged but stable. There is moderate tortuosity and calcification of the thoracic aorta. Chronic bronchitic and emphysematous lung changes but no definite infiltrates, edema or effusions. The bony thorax is intact. IMPRESSION: Mild stable cardiac enlargement.  Chronic emphysematous and bronchitic lung changes without acute overlying pulmonary process. Electronically Signed   By: Marijo Sanes M.D.   On: 11/22/2017 21:21   Ct Head Wo Contrast  Result Date: 12/06/2017 CLINICAL DATA:  Altered level of consciousness. Left chest hematoma. EXAM: CT HEAD WITHOUT CONTRAST TECHNIQUE: Contiguous axial images were obtained from the base of the skull through the vertex without intravenous contrast. COMPARISON:  CT head without contrast 11/27/2017. FINDINGS: Brain: Atrophy and white matter disease are stable. No acute infarct, hemorrhage, or mass lesion is present. The ventricles are proportionate to the degree of atrophy. Brainstem and cerebellum are normal. No significant extra-axial fluid collection is present. Vascular: Atherosclerotic calcifications are present within the cavernous internal carotid arteries and at the left vertebral artery. There is no hyperdense vessel. Skull: Calvarium is intact. No focal lytic or blastic lesions are present. No significant extracranial soft tissue hematoma is present. Sinuses/Orbits: The paranasal sinuses and mastoid air cells are clear. Bilateral lens replacements are present. Globes and orbits are within normal limits. IMPRESSION: 1. Stable atrophy and white matter disease. This likely reflects the sequela of chronic microvascular ischemia. 2. No acute intracranial abnormality or evidence for focal trauma. 3. Atherosclerosis. Electronically Signed   By: San Morelle M.D.   On: 12/06/2017 17:40   Ct Head Wo Contrast  Result Date: 11/27/2017 CLINICAL DATA:  Bacteremia.  Generalized weakness.  Confusion. EXAM: CT HEAD WITHOUT CONTRAST TECHNIQUE: Contiguous axial images were obtained from the base of the skull through the vertex without intravenous contrast. COMPARISON:  10/31/2017 FINDINGS: Brain: Similar findings of advanced atrophy with sulcal prominence and prominence of the bifrontal extra-axial spaces. Minimal  periventricular hypodensities compatible microvascular ischemic disease. No CT evidence of acute large territory infarct. No intraparenchymal or extra-axial mass or hemorrhage. Unchanged size and configuration of the ventricles and the basilar cisterns. No midline shift. Vascular: Intracranial atherosclerosis. Skull: No displaced calvarial fracture. Chronic leftward deviation of the nasal bones. Sinuses/Orbits: Paranasal sinuses and mastoid air cells are normally aerated. No air-fluid levels. Debris is seen within the bilateral external auditory canals. Post bilateral cataract surgery. Other: Regional soft tissues appear normal. IMPRESSION: Similar findings of microvascular ischemic disease and markedly advanced atrophy without superimposed acute intracranial process. Electronically Signed   By: Sandi Mariscal M.D.   On: 11/27/2017 09:23   Ct Chest Wo Contrast  Result Date: 12/07/2017 CLINICAL DATA:  Follow-up left chest wall hematoma, shortness of breath and weakness EXAM: CT CHEST WITHOUT CONTRAST TECHNIQUE: Multidetector CT imaging of the chest was performed following the standard protocol without IV contrast. COMPARISON:  12/06/2017 FINDINGS: Cardiovascular: Thoracic aorta and its branches demonstrate diffuse vascular calcifications. No aneurysmal dilatation is seen. No significant cardiac enlargement is noted. Coronary calcifications are seen. Right-sided PICC line is again identified and stable. Mediastinum/Nodes: Thoracic inlet is within normal limits. No significant hilar or mediastinal adenopathy is noted. The esophagus is within normal limits. Lungs/Pleura: Large bilateral pleural effusions are again identified. Some associated atelectatic changes are noted particularly in the lower lobes bilaterally. Scattered calcified granulomas are seen. No focal mass lesion is noted. Upper Abdomen: Ascites is again identified surrounding the liver stable from the prior exam. The previously noted gallstones are less  well appreciated on the current study. No new focal abnormality is noted within the liver. Musculoskeletal: There is again noted a large left anterior chest wall hematoma. This measures approximately 12 x 6.5 cm in transverse and AP dimensions and extends for at least 12 cm in the craniocaudad projection. It demonstrates increased liquefaction with a fluid-fluid level within. The overall appearance is relatively stable when compared with the prior exam with the exception of the breakdown of blood products within. Degenerative changes of the thoracic spine are again seen and stable. IMPRESSION: Persistent and relatively stable left chest wall hematoma as described. There has been some increase in the breakdown of the blood products within the collection. Stable large bilateral pleural effusions with associated atelectatic changes. Changes of prior granulomatous disease. Ascites within the abdomen Aortic Atherosclerosis (ICD10-I70.0). Electronically Signed   By: Inez Catalina M.D.   On: 12/07/2017 10:01   Ct Chest Wo Contrast  Result Date: 12/06/2017 CLINICAL DATA:  Chest wall hematoma. EXAM: CT CHEST WITHOUT CONTRAST TECHNIQUE: Multidetector CT imaging of the chest was performed following the standard protocol without IV contrast. COMPARISON:  Two-view chest x-ray 12/01/2017. FINDINGS: Cardiovascular: The heart size is normal. Atherosclerotic calcifications are present within the coronary arteries. Valvular calcifications are present at the mitral and anti aortic valves. Atherosclerotic calcifications are present at the aortic arch. A right-sided PICC line is in place. Pulmonary arteries are unremarkable. Mediastinum/Nodes: No significant mediastinal or axillary adenopathy is present. Lungs/Pleura: Large bilateral pleural effusions are present. There is associated atelectasis. No consolidated airspace disease is present. Upper Abdomen: Moderate abdominal ascites present. The gallbladder is distended.  Layering  gallstones are evident. Vascular calcifications are noted. Musculoskeletal: A left subpectoral chest wall hematoma measures 14 x 12 x 5 cm. Surrounding inflammatory changes are evident. No other soft tissue hemorrhage is present. IMPRESSION: 1. Large chest wall hematoma anteriorly on the left measures 14 x 12 x 5 cm. This appears to be subpectoral. 2. Large bilateral pleural effusions and associated atelectasis. 3. Abdominal ascites. 4. The combination suggests both right and left-sided heart failure. 5. Cholelithiasis without evidence for cholecystitis. 6. Extensive atherosclerotic disease including coronary artery disease. 7.  Aortic Atherosclerosis (ICD10-I70.0). Electronically Signed   By: San Morelle M.D.   On: 12/06/2017 18:00   Dg Chest Port 1 View  Result Date: 11/28/2017 CLINICAL DATA:  Evaluate for PICC line placement EXAM: PORTABLE CHEST 1 VIEW COMPARISON:  11/22/2017 FINDINGS: Right PICC tip projects in the lower superior vena cava at the level of the caval atrial junction. There is lung base opacity that has increased when compared to the prior study, more notable on the right. This is likely a combination of small effusions with either atelectasis or infiltrate. There is no pulmonary edema. Heart is top-normal in size.  No mediastinal or hilar masses. No pneumothorax. IMPRESSION: 1. Right PICC tip projects in the lower superior vena cava at the level of the caval atrial junction. 2. Increased lung base opacity since prior study more notable on the right. This is likely combination of small effusions with atelectasis or pneumonia. No evidence of pulmonary edema. Electronically Signed   By: Lajean Manes M.D.   On: 11/28/2017 18:22    Lab Data:  CBC: Recent Labs  Lab 12/03/17 0435 12/04/17 0434 12/05/17 0302 12/06/17 0629 12/06/17 1655 12/07/17 0203  WBC 17.1* 10.8* 9.8 9.0  --  13.2*  HGB 9.9* 9.4* 10.1* 8.6* 6.6* 8.6*  HCT 29.7* 27.8* 29.6* 25.1* 18.9* 24.7*  MCV 95.8 95.5  95.2 95.4  --  88.8  PLT 138* 136* 156 154  --  761*   Basic Metabolic Panel: Recent Labs  Lab 12/01/17 0432 12/02/17 0429 12/03/17 0435 12/06/17 0629 12/07/17 0203  NA 138 136 134* 136 136  K 3.5 3.7 3.4* 3.7 4.1  CL 110 110 108 112* 114*  CO2 21* 18* 19* 19* 16*  GLUCOSE 94 97 103* 106* 147*  BUN 19 29* 34* 25* 32*  CREATININE 0.95 1.04 1.07 1.04 1.50*  CALCIUM 8.1* 8.0* 7.9* 7.8* 7.6*   GFR: Estimated Creatinine Clearance: 30.7 mL/min (A) (by C-G formula based on SCr of 1.5 mg/dL (H)). Liver Function Tests: No results for input(s): AST, ALT, ALKPHOS, BILITOT, PROT, ALBUMIN in the last 168 hours. No results for input(s): LIPASE, AMYLASE in the last 168 hours. No results for input(s): AMMONIA in the last 168 hours. Coagulation Profile: Recent Labs  Lab 12/03/17 0435 12/04/17 0434 12/05/17 0302 12/06/17 0629 12/07/17 0203  INR 1.95 1.46 1.43 1.47 1.59   Cardiac Enzymes: No results for input(s): CKTOTAL, CKMB, CKMBINDEX, TROPONINI in the last 168 hours. BNP (last 3 results) No results for input(s): PROBNP in the last 8760 hours. HbA1C: No results for input(s): HGBA1C in the last 72 hours. CBG: No results for input(s): GLUCAP in the last 168 hours. Lipid Profile: No results for input(s): CHOL, HDL, LDLCALC, TRIG, CHOLHDL, LDLDIRECT in the last 72 hours. Thyroid Function Tests: No results for input(s): TSH, T4TOTAL, FREET4, T3FREE, THYROIDAB in the last 72 hours. Anemia Panel: No results for input(s): VITAMINB12, FOLATE, FERRITIN, TIBC, IRON, RETICCTPCT in the last 72 hours.  Urine analysis:    Component Value Date/Time   COLORURINE AMBER (A) 12/01/2017 0825   APPEARANCEUR CLOUDY (A) 12/01/2017 0825   LABSPEC 1.023 12/01/2017 0825   PHURINE 5.0 12/01/2017 0825   GLUCOSEU NEGATIVE 12/01/2017 0825   HGBUR SMALL (A) 12/01/2017 0825   BILIRUBINUR NEGATIVE 12/01/2017 0825   KETONESUR 5 (A) 12/01/2017 0825   PROTEINUR 100 (A) 12/01/2017 0825   NITRITE NEGATIVE  12/01/2017 0825   LEUKOCYTESUR TRACE (A) 12/01/2017 0825     Melea Prezioso M.D. [facility] 12/07/2017, 12:19 PM  Pager: [phone] Between 7am to 7pm - call Pager - [phone]  After 7pm go to [email or web address] - password [email or web address]  Call night coverage person covering after 7pm

## 2017-12-07 NOTE — Care Management Note (Signed)
Case Management Note  Patient Details  Name: Peter Moreno MRN: [id number] Date of Birth: December 19, 1926  Subjective/Objective:     Streptococcal bacteremia, dental caries, planned extraction on 12/07/2017, IV abx until 3/22.                Action/Plan: Please review previous NCM notes. CSW following for SNF -rehab. Has bed a [facility] in [place]. Will dc to SNF when medically stable. [facility] rep to make aware.    Expected Discharge Date:  11/25/17               Expected Discharge Plan:  [facility]  In-House Referral:  Clinical Social Work  Discharge planning Services  CM Consult  Post Acute Care Choice:  NA Choice offered to:  NA  DME Arranged:  N/A DME Agency:  NA  HH Arranged:  NA HH Agency:  NA  Status of Service:  Completed, signed off  If discussed at [place] of Stay Meetings, dates discussed:  12/07/2017  Additional Comments:  Erenest Rasher, RN 12/07/2017, 10:30 AM

## 2017-12-07 NOTE — Progress Notes (Signed)
Initial Nutrition Assessment  DOCUMENTATION CODES:   Not applicable  INTERVENTION:  Ensure Enlive po BID, each supplement provides 350 kcal and 20 grams of protein  Recommend obtain new weight  NUTRITION DIAGNOSIS:   Inadequate oral intake related to lethargy/confusion as evidenced by per patient/family report.  GOAL:   Patient will meet greater than or equal to 90% of their needs  MONITOR:   PO intake, I & O's, Labs, Weight trends, Supplement acceptance  REASON FOR ASSESSMENT:   LOS    ASSESSMENT:   Peter Moreno is a 84 y.o. male with medical history significant of hypertension, atrial fibrillation on Coumadin, skin cancer, dementia, gout, who presents with difficulty urinating and urge for urination., UTI, meets sepsis criteria  Attempted to speak with patient at bedside but it is unclear if he can provide accurate history.  He could not remember what he ate today or what his normal weight is. No new weight since 12/01/2017. Meal completion 21% so far.  Suffered from hemorrhagic shock yesterday with chest wall hematoma. Awaiting dental extraction for dental caries. Patient will likely require soft food afterwards given mentation. Muscle and fat depletions may be related to old age vs nutrition related cause of malnutrition.  Labs reviewed Medications reviewed and include:  Vitamin D  NUTRITION - FOCUSED PHYSICAL EXAM:    Most Recent Value  Orbital Region  Moderate depletion  Upper Arm Region  No depletion  Thoracic and Lumbar Region  No depletion  Buccal Region  Moderate depletion  Temple Region  No depletion  Clavicle Bone Region  Mild depletion  Clavicle and Acromion Bone Region  Mild depletion  Scapular Bone Region  Unable to assess  Dorsal Hand  Mild depletion  Patellar Region  No depletion  Anterior Thigh Region  No depletion  Posterior Calf Region  Mild depletion  Edema (RD Assessment)  None  Hair  Reviewed  Eyes  Reviewed  Mouth  Reviewed  Skin   Reviewed  Nails  Reviewed       Diet Order:  Diet NPO time specified Diet Heart Room service appropriate? Yes; Fluid consistency: Thin  EDUCATION NEEDS:   Not appropriate for education at this time  Skin:  Skin Assessment: Reviewed RN Assessment  Last BM:  flexiseal (type 7)  Height:   Ht Readings from Last 1 Encounters:  11/23/17 5\' 6"  (1.676 m)    Weight:   Wt Readings from Last 1 Encounters:  12/01/17 148 lb 2.4 oz (67.2 kg)    Ideal Body Weight:  64.54 kg  BMI:  Body mass index is 23.91 kg/m.  Estimated Nutritional Needs:   Kcal:  1550-1807 calories (MSJ x1.2-1.4)  Protein:  87-101 grams  Fluid:  1.6-1.8L  Satira Anis. Jaleeah Slight, MS, RD LDN Inpatient Clinical Dietitian Pager [phone]

## 2017-12-07 NOTE — Progress Notes (Signed)
Paged Dr. Enrique Sack per Dr. Tana Coast that pt will not have dental extraction today.

## 2017-12-08 LAB — CBC
HCT: 19.4 % — ABNORMAL LOW (ref 39.0–52.0)
HEMOGLOBIN: 6.8 g/dL — AB (ref 13.0–17.0)
MCH: 31.3 pg (ref 26.0–34.0)
MCHC: 35.1 g/dL (ref 30.0–36.0)
MCV: 89.4 fL (ref 78.0–100.0)
PLATELETS: 107 10*3/uL — AB (ref 150–400)
RBC: 2.17 MIL/uL — AB (ref 4.22–5.81)
RDW: 17.1 % — ABNORMAL HIGH (ref 11.5–15.5)
WBC: 9.2 10*3/uL (ref 4.0–10.5)

## 2017-12-08 LAB — BASIC METABOLIC PANEL
Anion gap: 6 (ref 5–15)
BUN: 42 mg/dL — ABNORMAL HIGH (ref 6–20)
CALCIUM: 7.8 mg/dL — AB (ref 8.9–10.3)
CHLORIDE: 114 mmol/L — AB (ref 101–111)
CO2: 17 mmol/L — ABNORMAL LOW (ref 22–32)
CREATININE: 1.65 mg/dL — AB (ref 0.61–1.24)
GFR calc Af Amer: 41 mL/min — ABNORMAL LOW (ref >60)
GFR, EST NON AFRICAN AMERICAN: 35 mL/min — AB (ref >60)
GLUCOSE: 105 mg/dL — AB (ref 65–99)
POTASSIUM: 4 mmol/L (ref 3.5–5.1)
Sodium: 137 mmol/L (ref 135–145)

## 2017-12-08 LAB — PROTIME-INR
INR: 1.54
Prothrombin Time: 18.3 seconds — ABNORMAL HIGH (ref 11.4–15.2)

## 2017-12-08 LAB — PREPARE RBC (CROSSMATCH)

## 2017-12-08 MED ORDER — MORPHINE SULFATE (PF) 2 MG/ML IV SOLN: 1.0000 mg | INTRAVENOUS | Status: DC | PRN

## 2017-12-08 MED ORDER — SODIUM CHLORIDE 0.9 % IV SOLN: Freq: Once | INTRAVENOUS | Status: DC

## 2017-12-08 MED ORDER — FUROSEMIDE 10 MG/ML IJ SOLN: 20.0000 mg | Freq: Once | INTRAMUSCULAR | Status: DC

## 2017-12-08 NOTE — Progress Notes (Addendum)
[facility]                                                                              Patient Demographics  Peter Moreno, is a 84 y.o. male, DOB - [birth year], MWN:[id number]  Admit date - 11/22/2017   Admitting Physician Ivor Costa, MD  Outpatient Primary MD for the patient is Cyndy Freeze, MD  Outpatient specialists:   LOS - 15  days   Medical records reviewed and are as summarized below:    Chief Complaint  Patient presents with  . Weakness       Brief summary   Peter Davisis a 84 y.o.malewith medical history significant ofhypertension, atrial fibrillation on Coumadin, skin cancer, dementia, gout,who presents with difficulty urinating and urge for urination. Admitted and found to have sepsis secondary to strep viridans bacteremia suspect secondary to dental caries. Dentistry consulted and also following.    Assessment & Plan    Principal Problem:  Sepsis due to Streptococcal bacteremia, C. difficile colitis - Suspected poor dental caries as a source of bacteremia.  Blood cultures 2/20 was positive for Streptococcus viridans, patient was placed on IV ceftriaxone, repeat blood cultures 2/27 were negative. Orthopantogram showed periapical lucency involving both main) debridement of the left mandibular premolars. Plan was dental surgery, however patient developed large chest wall hematoma, severe anemia and now placed on comfort care.  Acute metabolic encephalopathy, hypotensive: - Patient much more lethargic, somnolent today, hemoglobin 6.8, transfuse 1 unit packed RBCs. - Patient was placed on midodrine, BP continue to remain low despite packed RBC transfusion and midodrine. Not on any narcotics, Cardizem was placed on hold. - Discussed in detail with patient's son and daughter-in-law at the bedside, patient has been slowly deteriorating, hypotensive, very lethargic but arousable. Patient's son also talked to the patient and requested complete  comfort care status at this time. - Discussed about labs, IV antibiotics, other medications, all discontinued with patient's family's request. Placed palliative care consult for symptom control, morphine as needed for comfort, respiratory distress.  Hemorrhagic shock with acute on chronic anemia, chest wall hematoma - On 3/4, patient was found to be hypotensive, tachycardiac, almost syncopal with PT. CT chest 3/4 showed large chest wall hematoma 14 x 12 x5 cm, subpectoral, bilateral pleural effusions. Hemoglobin 6.6. Heparin drip was discontinued, received IV fluids, 2 units packed RBCs, protamine 25 mg IV 1 - Repeat CT chest on 3/5 showed persistent and relatively stable left chest wall hematoma. No active bleeding. - CT surgery was consulted, seen by Dr. Servando Snare, recommended to continue observation, agreed with management up to now. With patient's poor medical condition and age, was deemed nonsurgical for evacuation of the hematoma.   Chronic atrial fibrillation, with mild RVR due to severe anemia - Rate controlled, currently comfort care status  C. difficile colitis - Has rectal tube, on oral vancomycin, now discontinued due to comfort care status  Dementia - Aricept discontinued due to comfort care status  Acute kidney injury likely due to severe anemia and hypotension/shock - Creatinine slightly trended up likely due to severe anemia, patient has received packed RBC transfusion today am - Labs canceled due  to comfort care status  Dental caries - Likely the cause of strep bacteremia, dental extraction was planned but placed on hold due to above events  Code Status: DNR/DNI, comfort care status, no CODE BLUE DVT Prophylaxis:  SCD Family Communication: Discussed in detail with patient's son and daughter-in-law at the bedside.   Disposition Plan: Prognosis poor Time Spent in minutes  35 minutes  Procedures:  CT chest  Consultants:   Dental medicine Infectious disease CT  surgery  Antimicrobials:   Oral vancomycin  IV ceftriaxone   Medications  Scheduled Meds: . chlorhexidine  15 mL Mouth Rinse BID  . erythromycin  1 application Both Eyes Daily  . midodrine  10 mg Oral TID WC   Continuous Infusions: . sodium chloride     PRN Meds:.acetaminophen **OR** acetaminophen, morphine injection, [DISCONTINUED] ondansetron **OR** ondansetron (ZOFRAN) IV, polyethylene glycol, sodium chloride flush, sodium chloride flush   Antibiotics   Anti-infectives (From admission, onward)   Start     Dose/Rate Route Frequency Ordered Stop   12/01/17 1130  vancomycin (VANCOCIN) 50 mg/mL oral solution 125 mg  Status:  Discontinued     125 mg Oral 4 times daily 12/01/17 1045 12/08/17 1135   11/24/17 2200  vancomycin (VANCOCIN) IVPB 750 mg/150 ml premix  Status:  Discontinued     750 mg 150 mL/hr over 60 Minutes Intravenous Every 24 hours 11/23/17 2101 11/23/17 2105   11/24/17 2000  levofloxacin (LEVAQUIN) IVPB 750 mg  Status:  Discontinued     750 mg 100 mL/hr over 90 Minutes Intravenous Every 48 hours 11/22/17 2136 11/23/17 0038   11/24/17 1200  cefTRIAXone (ROCEPHIN) 2 g in sodium chloride 0.9 % 100 mL IVPB  Status:  Discontinued     2 g 200 mL/hr over 30 Minutes Intravenous Every 24 hours 11/24/17 1031 12/08/17 1135   11/23/17 2200  vancomycin (VANCOCIN) IVPB 750 mg/150 ml premix  Status:  Discontinued     750 mg 150 mL/hr over 60 Minutes Intravenous Every 24 hours 11/22/17 2136 11/23/17 0038   11/23/17 2200  vancomycin (VANCOCIN) IVPB 750 mg/150 ml premix  Status:  Discontinued     750 mg 150 mL/hr over 60 Minutes Intravenous Every 24 hours 11/23/17 2105 11/24/17 1031   11/23/17 2115  vancomycin (VANCOCIN) IVPB 1000 mg/200 mL premix  Status:  Discontinued     1,000 mg 200 mL/hr over 60 Minutes Intravenous  Once 11/23/17 2101 11/23/17 2105   11/23/17 0600  aztreonam (AZACTAM) 1 g in sodium chloride 0.9 % 100 mL IVPB  Status:  Discontinued     1 g 200 mL/hr over  30 Minutes Intravenous Every 8 hours 11/22/17 2136 11/24/17 1031   11/22/17 2000  levofloxacin (LEVAQUIN) IVPB 750 mg     750 mg 100 mL/hr over 90 Minutes Intravenous  Once 11/22/17 1958 11/22/17 2315   11/22/17 2000  aztreonam (AZACTAM) 2 g in sodium chloride 0.9 % 100 mL IVPB     2 g 200 mL/hr over 30 Minutes Intravenous  Once 11/22/17 1958 11/23/17 0022   11/22/17 2000  vancomycin (VANCOCIN) IVPB 1000 mg/200 mL premix     1,000 mg 200 mL/hr over 60 Minutes Intravenous  Once 11/22/17 1958 11/23/17 0139        Subjective:   Abdirahim Mounsey was seen and examined today. Lethargic, barely arousable, BP low from overnight.    Objective:   Vitals:   12/08/17 1125 12/08/17 1140 12/08/17 1155 12/08/17 1225  BP: (!) 71/40 (!) 77/66 Marland Kitchen)  69/30 (!) 72/40  Pulse:      Resp: 14 (!) 21 17 15   Temp:      TempSrc:      SpO2: 100% 100% 100% 100%  Weight:      Height:        Intake/Output Summary (Last 24 hours) at 12/08/2017 1332 Last data filed at 12/08/2017 0745 Gross per 24 hour  Intake 425 ml  Output 450 ml  Net -25 ml     Wt Readings from Last 3 Encounters:  12/01/17 67.2 kg (148 lb 2.4 oz)  09/29/17 63 kg (139 lb)  05/25/17 62.6 kg (138 lb)     Exam   General: Very somnolent and lethargic  Eyes:  HEENT:  Cardiovascular: S1 S2 clear, irregularly irregular  Respiratory: CTA anteriorly, bruising and hematoma on the left chest wall  Gastrointestinal: Soft, NBS  Ext: no pedal edema bilaterally  Neuro: somnolent and lethargic  Musculoskeletal: No digital cyanosis, clubbing  Skin: Bruises and ecchymosis on the left chest wall  Psych: somnolent   Data Reviewed:  I have personally reviewed following labs and imaging studies  Micro Results Recent Results (from the past 240 hour(s))  Culture, blood (routine x 2)     Status: None   Collection Time: 12/01/17  7:30 AM  Result Value Ref Range Status   Specimen Description BLOOD LEFT ANTECUBITAL  Final   Special  Requests IN PEDIATRIC BOTTLE Blood Culture adequate volume  Final   Culture   Final    NO GROWTH 5 DAYS Performed at [facility], [address], [place], [place] [address]    Report Status 12/06/2017 FINAL  Final  C difficile quick scan w PCR reflex     Status: Abnormal   Collection Time: 12/01/17  8:15 AM  Result Value Ref Range Status   C Diff antigen POSITIVE (A) NEGATIVE Final   C Diff toxin POSITIVE (A) NEGATIVE Final   C Diff interpretation Toxin producing C. difficile detected.  Final    Comment: CRITICAL RESULT CALLED TO, READ BACK BY AND VERIFIED WITH: Donetta Potts RN 10:20 12/01/17 ([email or web address])   Urine Culture     Status: None   Collection Time: 12/01/17 10:22 AM  Result Value Ref Range Status   Specimen Description URINE, CLEAN CATCH  Final   Special Requests NONE  Final   Culture   Final    NO GROWTH Performed at [facility], [address] [place], [place] [address]    Report Status 12/02/2017 FINAL  Final  Culture, blood (routine x 2)     Status: None   Collection Time: 12/01/17 11:40 AM  Result Value Ref Range Status   Specimen Description BLOOD LEFT ANTECUBITAL  Final   Special Requests   Final    BOTTLES DRAWN AEROBIC ONLY Blood Culture adequate volume   Culture   Final    NO GROWTH 5 DAYS Performed at [facility], [place] [address] [place], [place] [address]    Report Status 12/06/2017 FINAL  Final  Surgical pcr screen     Status: None   Collection Time: 12/06/17  5:41 AM  Result Value Ref Range Status   MRSA, PCR NEGATIVE NEGATIVE Final   Staphylococcus aureus NEGATIVE NEGATIVE Final    Comment: (NOTE) The Xpert SA Assay (FDA approved for NASAL specimens in patients 73 years of age and older), is one component of a comprehensive surveillance program. It is not intended to diagnose infection nor to  guide or monitor treatment. Performed at [facility], [place] [address] [place], [place] [address]     Radiology Reports Dg  Orthopantogram  Result Date: 11/26/2017 CLINICAL DATA:  Bacteremia. EXAM: ORTHOPANTOGRAM/PANORAMIC COMPARISON:  None. FINDINGS: Periapical lucency is seen involving a left mandibular premolar and remaining right mandibular molars. IMPRESSION: Periapical lucency involving both remain right mandibular molars and left mandibular premolar. Consider dental referral and dedicated dental radiographs for further evaluation. Electronically Signed   By: Earle Gell M.D.   On: 11/26/2017 19:44   Dg Chest 2 View  Result Date: 12/01/2017 CLINICAL DATA:  84 year old male with bacteremia. PICC line placement. EXAM: CHEST  2 VIEW COMPARISON:  11/28/2017 and earlier. FINDINGS: AP and lateral views of the chest. Stable right upper extremity approach PICC line. Stable mild cardiomegaly and other mediastinal contours. Calcified aortic atherosclerosis. Visualized tracheal air column is within normal limits. No pneumothorax. Confluent lower lobe pulmonary opacity which could be bilateral or in the left lower lobe. Possible small pleural effusion(s). No pulmonary edema. No other confluent opacity. No acute osseous abnormality identified. Negative visible bowel gas pattern. IMPRESSION: 1. Lower lobe pneumonia suspected, perhaps bilateral. Small associated pleural effusions not excluded. 2. Stable right PICC line. Electronically Signed   By: Genevie Ann M.D.   On: 12/01/2017 10:44   Dg Chest 2 View  Result Date: 11/22/2017 CLINICAL DATA:  Sepsis EXAM: CHEST  2 VIEW COMPARISON:  10/31/2017 FINDINGS: The heart is mildly enlarged but stable. There is moderate tortuosity and calcification of the thoracic aorta. Chronic bronchitic and emphysematous lung changes but no definite infiltrates, edema or effusions. The bony thorax is intact. IMPRESSION: Mild stable cardiac enlargement. Chronic emphysematous and bronchitic lung changes without acute overlying pulmonary process. Electronically Signed   By: Marijo Sanes M.D.   On: 11/22/2017 21:21    Ct Head Wo Contrast  Result Date: 12/06/2017 CLINICAL DATA:  Altered level of consciousness. Left chest hematoma. EXAM: CT HEAD WITHOUT CONTRAST TECHNIQUE: Contiguous axial images were obtained from the base of the skull through the vertex without intravenous contrast. COMPARISON:  CT head without contrast 11/27/2017. FINDINGS: Brain: Atrophy and white matter disease are stable. No acute infarct, hemorrhage, or mass lesion is present. The ventricles are proportionate to the degree of atrophy. Brainstem and cerebellum are normal. No significant extra-axial fluid collection is present. Vascular: Atherosclerotic calcifications are present within the cavernous internal carotid arteries and at the left vertebral artery. There is no hyperdense vessel. Skull: Calvarium is intact. No focal lytic or blastic lesions are present. No significant extracranial soft tissue hematoma is present. Sinuses/Orbits: The paranasal sinuses and mastoid air cells are clear. Bilateral lens replacements are present. Globes and orbits are within normal limits. IMPRESSION: 1. Stable atrophy and white matter disease. This likely reflects the sequela of chronic microvascular ischemia. 2. No acute intracranial abnormality or evidence for focal trauma. 3. Atherosclerosis. Electronically Signed   By: San Morelle M.D.   On: 12/06/2017 17:40   Ct Head Wo Contrast  Result Date: 11/27/2017 CLINICAL DATA:  Bacteremia.  Generalized weakness.  Confusion. EXAM: CT HEAD WITHOUT CONTRAST TECHNIQUE: Contiguous axial images were obtained from the base of the skull through the vertex without intravenous contrast. COMPARISON:  10/31/2017 FINDINGS: Brain: Similar findings of advanced atrophy with sulcal prominence and prominence of the bifrontal extra-axial spaces. Minimal periventricular hypodensities compatible microvascular ischemic disease. No CT evidence of acute large territory infarct. No intraparenchymal or extra-axial mass or hemorrhage.  Unchanged size and configuration of  the ventricles and the basilar cisterns. No midline shift. Vascular: Intracranial atherosclerosis. Skull: No displaced calvarial fracture. Chronic leftward deviation of the nasal bones. Sinuses/Orbits: Paranasal sinuses and mastoid air cells are normally aerated. No air-fluid levels. Debris is seen within the bilateral external auditory canals. Post bilateral cataract surgery. Other: Regional soft tissues appear normal. IMPRESSION: Similar findings of microvascular ischemic disease and markedly advanced atrophy without superimposed acute intracranial process. Electronically Signed   By: Sandi Mariscal M.D.   On: 11/27/2017 09:23   Ct Chest Wo Contrast  Result Date: 12/07/2017 CLINICAL DATA:  Follow-up left chest wall hematoma, shortness of breath and weakness EXAM: CT CHEST WITHOUT CONTRAST TECHNIQUE: Multidetector CT imaging of the chest was performed following the standard protocol without IV contrast. COMPARISON:  12/06/2017 FINDINGS: Cardiovascular: Thoracic aorta and its branches demonstrate diffuse vascular calcifications. No aneurysmal dilatation is seen. No significant cardiac enlargement is noted. Coronary calcifications are seen. Right-sided PICC line is again identified and stable. Mediastinum/Nodes: Thoracic inlet is within normal limits. No significant hilar or mediastinal adenopathy is noted. The esophagus is within normal limits. Lungs/Pleura: Large bilateral pleural effusions are again identified. Some associated atelectatic changes are noted particularly in the lower lobes bilaterally. Scattered calcified granulomas are seen. No focal mass lesion is noted. Upper Abdomen: Ascites is again identified surrounding the liver stable from the prior exam. The previously noted gallstones are less well appreciated on the current study. No new focal abnormality is noted within the liver. Musculoskeletal: There is again noted a large left anterior chest wall hematoma. This  measures approximately 12 x 6.5 cm in transverse and AP dimensions and extends for at least 12 cm in the craniocaudad projection. It demonstrates increased liquefaction with a fluid-fluid level within. The overall appearance is relatively stable when compared with the prior exam with the exception of the breakdown of blood products within. Degenerative changes of the thoracic spine are again seen and stable. IMPRESSION: Persistent and relatively stable left chest wall hematoma as described. There has been some increase in the breakdown of the blood products within the collection. Stable large bilateral pleural effusions with associated atelectatic changes. Changes of prior granulomatous disease. Ascites within the abdomen Aortic Atherosclerosis (ICD10-I70.0). Electronically Signed   By: Inez Catalina M.D.   On: 12/07/2017 10:01   Ct Chest Wo Contrast  Result Date: 12/06/2017 CLINICAL DATA:  Chest wall hematoma. EXAM: CT CHEST WITHOUT CONTRAST TECHNIQUE: Multidetector CT imaging of the chest was performed following the standard protocol without IV contrast. COMPARISON:  Two-view chest x-ray 12/01/2017. FINDINGS: Cardiovascular: The heart size is normal. Atherosclerotic calcifications are present within the coronary arteries. Valvular calcifications are present at the mitral and anti aortic valves. Atherosclerotic calcifications are present at the aortic arch. A right-sided PICC line is in place. Pulmonary arteries are unremarkable. Mediastinum/Nodes: No significant mediastinal or axillary adenopathy is present. Lungs/Pleura: Large bilateral pleural effusions are present. There is associated atelectasis. No consolidated airspace disease is present. Upper Abdomen: Moderate abdominal ascites present. The gallbladder is distended. Layering gallstones are evident. Vascular calcifications are noted. Musculoskeletal: A left subpectoral chest wall hematoma measures 14 x 12 x 5 cm. Surrounding inflammatory changes are  evident. No other soft tissue hemorrhage is present. IMPRESSION: 1. Large chest wall hematoma anteriorly on the left measures 14 x 12 x 5 cm. This appears to be subpectoral. 2. Large bilateral pleural effusions and associated atelectasis. 3. Abdominal ascites. 4. The combination suggests both right and left-sided heart failure. 5. Cholelithiasis without evidence for  cholecystitis. 6. Extensive atherosclerotic disease including coronary artery disease. 7.  Aortic Atherosclerosis (ICD10-I70.0). Electronically Signed   By: San Morelle M.D.   On: 12/06/2017 18:00   Dg Chest Port 1 View  Result Date: 11/28/2017 CLINICAL DATA:  Evaluate for PICC line placement EXAM: PORTABLE CHEST 1 VIEW COMPARISON:  11/22/2017 FINDINGS: Right PICC tip projects in the lower superior vena cava at the level of the caval atrial junction. There is lung base opacity that has increased when compared to the prior study, more notable on the right. This is likely a combination of small effusions with either atelectasis or infiltrate. There is no pulmonary edema. Heart is top-normal in size.  No mediastinal or hilar masses. No pneumothorax. IMPRESSION: 1. Right PICC tip projects in the lower superior vena cava at the level of the caval atrial junction. 2. Increased lung base opacity since prior study more notable on the right. This is likely combination of small effusions with atelectasis or pneumonia. No evidence of pulmonary edema. Electronically Signed   By: Lajean Manes M.D.   On: 11/28/2017 18:22    Lab Data:  CBC: Recent Labs  Lab 12/04/17 0434 12/05/17 0302 12/06/17 0629 12/06/17 1655 12/07/17 0203 12/08/17 0433  WBC 10.8* 9.8 9.0  --  13.2* 9.2  HGB 9.4* 10.1* 8.6* 6.6* 8.6* 6.8*  HCT 27.8* 29.6* 25.1* 18.9* 24.7* 19.4*  MCV 95.5 95.2 95.4  --  88.8 89.4  PLT 136* 156 154  --  125* 702*   Basic Metabolic Panel: Recent Labs  Lab 12/02/17 0429 12/03/17 0435 12/06/17 0629 12/07/17 0203 12/08/17 0433    NA 136 134* 136 136 137  K 3.7 3.4* 3.7 4.1 4.0  CL 110 108 112* 114* 114*  CO2 18* 19* 19* 16* 17*  GLUCOSE 97 103* 106* 147* 105*  BUN 29* 34* 25* 32* 42*  CREATININE 1.04 1.07 1.04 1.50* 1.65*  CALCIUM 8.0* 7.9* 7.8* 7.6* 7.8*   GFR: Estimated Creatinine Clearance: 27.9 mL/min (A) (by C-G formula based on SCr of 1.65 mg/dL (H)). Liver Function Tests: No results for input(s): AST, ALT, ALKPHOS, BILITOT, PROT, ALBUMIN in the last 168 hours. No results for input(s): LIPASE, AMYLASE in the last 168 hours. No results for input(s): AMMONIA in the last 168 hours. Coagulation Profile: Recent Labs  Lab 12/04/17 0434 12/05/17 0302 12/06/17 0629 12/07/17 0203 12/08/17 0433  INR 1.46 1.43 1.47 1.59 1.54   Cardiac Enzymes: No results for input(s): CKTOTAL, CKMB, CKMBINDEX, TROPONINI in the last 168 hours. BNP (last 3 results) No results for input(s): PROBNP in the last 8760 hours. HbA1C: No results for input(s): HGBA1C in the last 72 hours. CBG: No results for input(s): GLUCAP in the last 168 hours. Lipid Profile: No results for input(s): CHOL, HDL, LDLCALC, TRIG, CHOLHDL, LDLDIRECT in the last 72 hours. Thyroid Function Tests: No results for input(s): TSH, T4TOTAL, FREET4, T3FREE, THYROIDAB in the last 72 hours. Anemia Panel: No results for input(s): VITAMINB12, FOLATE, FERRITIN, TIBC, IRON, RETICCTPCT in the last 72 hours. Urine analysis:    Component Value Date/Time   COLORURINE AMBER (A) 12/01/2017 0825   APPEARANCEUR CLOUDY (A) 12/01/2017 0825   LABSPEC 1.023 12/01/2017 0825   PHURINE 5.0 12/01/2017 0825   GLUCOSEU NEGATIVE 12/01/2017 0825   HGBUR SMALL (A) 12/01/2017 0825   BILIRUBINUR NEGATIVE 12/01/2017 0825   KETONESUR 5 (A) 12/01/2017 0825   PROTEINUR 100 (A) 12/01/2017 0825   NITRITE NEGATIVE 12/01/2017 0825   LEUKOCYTESUR TRACE (A) 12/01/2017 0825  Estill Cotta M.D. [facility] 12/08/2017, 1:32 PM  Pager: [phone] Between 7am to 7pm - call Pager  - [phone]  After 7pm go to [email or web address] - password [email or web address]  Call night coverage person covering after 7pm

## 2017-12-08 NOTE — Care Management Note (Addendum)
Case Management Note  Patient Details  Name: Tremane Spurgeon MRN: [id number] Date of Birth: September 23, 1927  Subjective/Objective:      Streptococcal bacteremia/ dental caries/ c. Diff.           Action/Plan: Pt condition unstable, soft b/p's.Marland KitchenMarland KitchenPalliative consult pending/ GOC to be established. Per MD's discussion with son, leaning toward comfort care. CM will continue to follow needs.  Expected Discharge Date:  11/25/17               Expected Discharge Plan:  [place]  In-House Referral:  Clinical Social Work  Discharge planning Services  CM Consult  Post Acute Care Choice:  NA Choice offered to:  NA  DME Arranged:  N/A DME Agency:  NA  HH Arranged:  NA HH Agency:  NA  Status of Service:    If discussed at [employer], dates discussed:    Additional Comments:  Sharin Mons, RN 12/08/2017, 11:21 AM

## 2017-12-08 NOTE — Progress Notes (Signed)
  PT Cancellation Note  Patient Details Name: Peter Moreno MRN: [id number] DOB: [birth year]   Cancelled Treatment:    Reason Eval/Treat Not Completed: (P) Medical issues which prohibited therapy(Pt with significant medical decline and no longer appropriate for PT services.  Pt is now comfort care. )   Cristela Blue 12/08/2017, 3:58 PM   Physical Therapy Discharge Patient Details Name: Peter Moreno MRN: [id number] DOB: [birth year] Today's Date: 12/08/2017 Time:  -     Patient discharged from PT services secondary to medical decline - will need to re-order PT to resume therapy services.  Please see latest therapy progress note for current level of functioning and progress toward goals.    Progress and discharge plan discussed with patient and/or caregiver:Caregiver agrees with plan, pt resting on arrival and caregiver met PT assistant at door.  Will inform supervising PT of medical decline and need to d/c PT services at this time.  Supervising PT to address goals and d/c order.   Randall, PTA pager [phone]  12/08/2017, 3:58 PM

## 2017-12-08 NOTE — Progress Notes (Signed)
CRITICAL VALUE ALERT  Critical Value: HGB 6.8 Date & Time Notied:  12/08/17 0615  Provider Notified: [facility] on call  Orders Received/Actions taken: notified

## 2017-12-09 DIAGNOSIS — R627 Adult failure to thrive: Secondary | ICD-10-CM

## 2017-12-09 DIAGNOSIS — B955 Unspecified streptococcus as the cause of diseases classified elsewhere: Secondary | ICD-10-CM

## 2017-12-09 DIAGNOSIS — Z515 Encounter for palliative care: Secondary | ICD-10-CM

## 2017-12-09 DIAGNOSIS — Z66 Do not resuscitate: Secondary | ICD-10-CM

## 2017-12-09 MED ORDER — MORPHINE SULFATE (CONCENTRATE) 10 MG/0.5ML PO SOLN: 5.0000 mg | ORAL | Status: DC | PRN

## 2017-12-09 MED ORDER — LORAZEPAM 1 MG PO TABS: 1.0000 mg | ORAL_TABLET | ORAL | 0 refills | Status: AC | PRN

## 2017-12-09 MED ORDER — HEPARIN SOD (PORK) LOCK FLUSH 100 UNIT/ML IV SOLN: 250.0000 [IU] | INTRAVENOUS | Status: DC | PRN

## 2017-12-09 MED ORDER — LORAZEPAM 1 MG PO TABS: 1.0000 mg | ORAL_TABLET | ORAL | Status: DC | PRN

## 2017-12-09 MED ORDER — MORPHINE SULFATE (CONCENTRATE) 10 MG/0.5ML PO SOLN: 5.0000 mg | ORAL | 0 refills | Status: AC | PRN

## 2017-12-09 NOTE — Progress Notes (Signed)
NCM received consult for home hospice. NCM spoke with dad/son (Scott),along with daughter in law and 2 grandsons @ bedside and confirmed d/c plan is home with hospice care. Choice offered and [facility] selected. NCM called and made home hospice  referral with [facility] @ [phone]. Tye [place] stated hospital liaison Hiliary will come by see pt/family in 30 minutes.NCM made family aware. Whitman Hero RN,BSN,CM

## 2017-12-09 NOTE — Discharge Summary (Signed)
Physician Discharge Summary  Peter Moreno DPO:[id number] DOB: [birth year] DOA: 11/22/2017  PCP: Cyndy Freeze, MD  Admit date: 11/22/2017  Discharge date: 12/09/2017  Admitted From:Home  Disposition:  Home hospice  Recommendations for Outpatient Follow-up:  1. Follow up with hospice care agents at home  Home Health:Hospice  Equipment/Devices:Delivered by hospice agency  Discharge Condition:Stable  CODE STATUS: DNR/DNI; on Comfort measures  Diet recommendation: Regular  Brief/Interim Summary:  Peter Moreno a 84 y.o.malewith medical history significant ofhypertension, atrial fibrillation on Coumadin, skin cancer, dementia, gout,who presents with difficulty urinating and urge for urination. Admitted and found to have sepsis secondary to strep viridans bacteremia suspect secondary to dental caries. Dentistry consultedand plan was to undergo dental surgery, but patient developed severe anemia secondary to large chest wall hematoma and therefore this was not performed. He was seen by CT surgery who recommended continued observation and deemed the patient nonsurgical for evacuation of the hematoma on account of his poor medical condition and age.  During the course of his stay, he also developed C. difficile colitis which was treated with oral vancomycin. He eventually opted for comfort measures and antibiotics were discontinued. He appears to be in stable condition today with improving appetite and family members as well as the patient would like to go home with home hospice care.   Discharge Diagnoses:  Principal Problem:   Streptococcal bacteremia Active Problems:   Atrial fibrillation with RVR (HCC)   Essential hypertension   Long term (current) use of anticoagulants   Alzheimer's dementia   UTI (urinary tract infection)   Sepsis (HCC)   Hypotension   Hyponatremia   Tooth abscess   Chest wall hematoma, left, initial encounter  Principal Problem:  Sepsis due to  Streptococcal bacteremia, C. difficile colitis - Suspected poor dental caries as a source of bacteremia.  Blood cultures 2/20 was positive for Streptococcus viridans, patient was placed on IV ceftriaxone, repeat blood cultures 2/27 were negative. Orthopantogram showed periapical lucency involving both main) debridement of the left mandibular premolars. Plan was dental surgery, however patient developed large chest wall hematoma, severe anemia and now placed on comfort care.  Acute metabolic encephalopathy, hypotensive-stable - Patient much more lethargic, somnolent today, hemoglobin 6.8, transfuse 1 unit packed RBCs. - Patient was placed on midodrine, BP continue to remain low despite packed RBC transfusion and midodrine. Not on any narcotics, Cardizem was placed on hold. - Discussed in detail with patient's son and daughter-in-law at the bedside, patient has been slowly deteriorating, hypotensive, very lethargic but arousable. Patient's son also talked to the patient and requested complete comfort care status at this time. - Discussed about labs, IV antibiotics, other medications, all discontinued with patient's family's request. Placed palliative care consult for symptom control, morphine as needed for comfort, respiratory distress.  Hemorrhagic shock with acute on chronic anemia, chest wall hematoma - On 3/4, patient was found to be hypotensive, tachycardiac, almost syncopal with PT. CT chest 3/4 showed large chest wall hematoma 14 x 12 x5 cm, subpectoral, bilateral pleural effusions. Hemoglobin 6.6. Heparin drip was discontinued, received IV fluids, 2 units packed RBCs, protamine 25 mg IV 1 - Repeat CT chest on 3/5 showed persistent and relatively stable left chest wall hematoma. No active bleeding. - CT surgery was consulted, seen by Dr. Servando Snare, recommended to continue observation, agreed with management up to now. With patient's poor medical condition and age, was deemed nonsurgical for  evacuation of the hematoma.   Chronic atrial fibrillation, with mild RVR due to severe  anemia - Rate controlled, currently comfort care status  C. difficile colitis - Has rectal tube, on oral vancomycin, now discontinued due to comfort care status  Dementia - Aricept discontinued due to comfort care status  Acute kidney injury likely due to severe anemia and hypotension/shock - Creatinine slightly trended up likely due to severe anemia, patient has received packed RBC transfusion today am - Labs canceled due to comfort care status  Dental caries - Likely the cause of strep bacteremia, dental extraction was planned but placed on hold due to above events    Discharge Instructions  Discharge Instructions    Diet - low sodium heart healthy   Complete by:  As directed    Increase activity slowly   Complete by:  As directed      Allergies as of 12/09/2017      Reactions   Adhesive [tape] Other (See Comments)   THE PATIENT'S SKIN IS VERY THIN AND WILL TEAR AND BRUISE VERY EASILY!!   Penicillins Rash, Other (See Comments)   PATIENT HAS HAD A PCN REACTION WITH IMMEDIATE RASH, FACIAL/TONGUE/THROAT SWELLING, SOB, OR LIGHTHEADEDNESS WITH HYPOTENSION:  #  #  #  YES  #  #  #   Has patient had a PCN reaction causing severe rash involving mucus membranes or skin necrosis: Unk Has patient had a PCN reaction that required hospitalization: No Has patient had a PCN reaction occurring within the last 10 years: No   Sunflower Oil Itching      Medication List    STOP taking these medications   benazepril 5 MG tablet Commonly known as:  LOTENSIN   diltiazem 120 MG 24 hr capsule Commonly known as:  CARDIZEM CD   LINZESS 290 MCG Caps capsule Generic drug:  linaclotide   MIRALAX PO   Vitamin D-3 1000 units Caps   warfarin 5 MG tablet Commonly known as:  COUMADIN     TAKE these medications   allopurinol 300 MG tablet Commonly known as:  ZYLOPRIM Take 300 mg by mouth daily.    CENTRUM SILVER 50+MEN Tabs Take 1 tablet by mouth daily with breakfast.   donepezil 10 MG tablet Commonly known as:  ARICEPT Take 1 tablet (10 mg total) by mouth at bedtime.   erythromycin ophthalmic ointment Place 1 application into both eyes daily.   LORazepam 1 MG tablet Commonly known as:  ATIVAN Take 1 tablet (1 mg total) by mouth every 4 (four) hours as needed for anxiety.   midodrine 2.5 MG tablet Commonly known as:  PROAMATINE Take 5 mg by mouth 3 (three) times daily.   morphine CONCENTRATE 10 MG/0.5ML Soln concentrated solution Take 0.25 mLs (5 mg total) by mouth every hour as needed for moderate pain or shortness of breath.      [place] Follow up.   Specialty:  Home Health Services Contact information: [place] [address] [phone]          Allergies  Allergen Reactions  . Adhesive [Tape] Other (See Comments)    THE PATIENT'S SKIN IS VERY THIN AND WILL TEAR AND BRUISE VERY EASILY!!  . Penicillins Rash and Other (See Comments)    PATIENT HAS HAD A PCN REACTION WITH IMMEDIATE RASH, FACIAL/TONGUE/THROAT SWELLING, SOB, OR LIGHTHEADEDNESS WITH HYPOTENSION:  #  #  #  YES  #  #  #   Has patient had a PCN reaction causing severe rash involving mucus membranes or skin necrosis: Unk  Has patient had a PCN reaction that required hospitalization: No Has patient had a PCN reaction occurring within the last 10 years: No   . Sunflower Oil Itching    Consultations:  None   Procedures/Studies: Dg Orthopantogram  Result Date: 11/26/2017 CLINICAL DATA:  Bacteremia. EXAM: ORTHOPANTOGRAM/PANORAMIC COMPARISON:  None. FINDINGS: Periapical lucency is seen involving a left mandibular premolar and remaining right mandibular molars. IMPRESSION: Periapical lucency involving both remain right mandibular molars and left mandibular premolar. Consider dental referral and dedicated dental radiographs for further  evaluation. Electronically Signed   By: Earle Gell M.D.   On: 11/26/2017 19:44   Dg Chest 2 View  Result Date: 12/01/2017 CLINICAL DATA:  85 year old male with bacteremia. PICC line placement. EXAM: CHEST  2 VIEW COMPARISON:  11/28/2017 and earlier. FINDINGS: AP and lateral views of the chest. Stable right upper extremity approach PICC line. Stable mild cardiomegaly and other mediastinal contours. Calcified aortic atherosclerosis. Visualized tracheal air column is within normal limits. No pneumothorax. Confluent lower lobe pulmonary opacity which could be bilateral or in the left lower lobe. Possible small pleural effusion(s). No pulmonary edema. No other confluent opacity. No acute osseous abnormality identified. Negative visible bowel gas pattern. IMPRESSION: 1. Lower lobe pneumonia suspected, perhaps bilateral. Small associated pleural effusions not excluded. 2. Stable right PICC line. Electronically Signed   By: Genevie Ann M.D.   On: 12/01/2017 10:44   Dg Chest 2 View  Result Date: 11/22/2017 CLINICAL DATA:  Sepsis EXAM: CHEST  2 VIEW COMPARISON:  10/31/2017 FINDINGS: The heart is mildly enlarged but stable. There is moderate tortuosity and calcification of the thoracic aorta. Chronic bronchitic and emphysematous lung changes but no definite infiltrates, edema or effusions. The bony thorax is intact. IMPRESSION: Mild stable cardiac enlargement. Chronic emphysematous and bronchitic lung changes without acute overlying pulmonary process. Electronically Signed   By: Marijo Sanes M.D.   On: 11/22/2017 21:21   Ct Head Wo Contrast  Result Date: 12/06/2017 CLINICAL DATA:  Altered level of consciousness. Left chest hematoma. EXAM: CT HEAD WITHOUT CONTRAST TECHNIQUE: Contiguous axial images were obtained from the base of the skull through the vertex without intravenous contrast. COMPARISON:  CT head without contrast 11/27/2017. FINDINGS: Brain: Atrophy and white matter disease are stable. No acute infarct,  hemorrhage, or mass lesion is present. The ventricles are proportionate to the degree of atrophy. Brainstem and cerebellum are normal. No significant extra-axial fluid collection is present. Vascular: Atherosclerotic calcifications are present within the cavernous internal carotid arteries and at the left vertebral artery. There is no hyperdense vessel. Skull: Calvarium is intact. No focal lytic or blastic lesions are present. No significant extracranial soft tissue hematoma is present. Sinuses/Orbits: The paranasal sinuses and mastoid air cells are clear. Bilateral lens replacements are present. Globes and orbits are within normal limits. IMPRESSION: 1. Stable atrophy and white matter disease. This likely reflects the sequela of chronic microvascular ischemia. 2. No acute intracranial abnormality or evidence for focal trauma. 3. Atherosclerosis. Electronically Signed   By: San Morelle M.D.   On: 12/06/2017 17:40   Ct Head Wo Contrast  Result Date: 11/27/2017 CLINICAL DATA:  Bacteremia.  Generalized weakness.  Confusion. EXAM: CT HEAD WITHOUT CONTRAST TECHNIQUE: Contiguous axial images were obtained from the base of the skull through the vertex without intravenous contrast. COMPARISON:  10/31/2017 FINDINGS: Brain: Similar findings of advanced atrophy with sulcal prominence and prominence of the bifrontal extra-axial spaces. Minimal periventricular hypodensities compatible microvascular ischemic disease. No CT evidence of acute large  territory infarct. No intraparenchymal or extra-axial mass or hemorrhage. Unchanged size and configuration of the ventricles and the basilar cisterns. No midline shift. Vascular: Intracranial atherosclerosis. Skull: No displaced calvarial fracture. Chronic leftward deviation of the nasal bones. Sinuses/Orbits: Paranasal sinuses and mastoid air cells are normally aerated. No air-fluid levels. Debris is seen within the bilateral external auditory canals. Post bilateral  cataract surgery. Other: Regional soft tissues appear normal. IMPRESSION: Similar findings of microvascular ischemic disease and markedly advanced atrophy without superimposed acute intracranial process. Electronically Signed   By: Sandi Mariscal M.D.   On: 11/27/2017 09:23   Ct Chest Wo Contrast  Result Date: 12/07/2017 CLINICAL DATA:  Follow-up left chest wall hematoma, shortness of breath and weakness EXAM: CT CHEST WITHOUT CONTRAST TECHNIQUE: Multidetector CT imaging of the chest was performed following the standard protocol without IV contrast. COMPARISON:  12/06/2017 FINDINGS: Cardiovascular: Thoracic aorta and its branches demonstrate diffuse vascular calcifications. No aneurysmal dilatation is seen. No significant cardiac enlargement is noted. Coronary calcifications are seen. Right-sided PICC line is again identified and stable. Mediastinum/Nodes: Thoracic inlet is within normal limits. No significant hilar or mediastinal adenopathy is noted. The esophagus is within normal limits. Lungs/Pleura: Large bilateral pleural effusions are again identified. Some associated atelectatic changes are noted particularly in the lower lobes bilaterally. Scattered calcified granulomas are seen. No focal mass lesion is noted. Upper Abdomen: Ascites is again identified surrounding the liver stable from the prior exam. The previously noted gallstones are less well appreciated on the current study. No new focal abnormality is noted within the liver. Musculoskeletal: There is again noted a large left anterior chest wall hematoma. This measures approximately 12 x 6.5 cm in transverse and AP dimensions and extends for at least 12 cm in the craniocaudad projection. It demonstrates increased liquefaction with a fluid-fluid level within. The overall appearance is relatively stable when compared with the prior exam with the exception of the breakdown of blood products within. Degenerative changes of the thoracic spine are again seen  and stable. IMPRESSION: Persistent and relatively stable left chest wall hematoma as described. There has been some increase in the breakdown of the blood products within the collection. Stable large bilateral pleural effusions with associated atelectatic changes. Changes of prior granulomatous disease. Ascites within the abdomen Aortic Atherosclerosis (ICD10-I70.0). Electronically Signed   By: Inez Catalina M.D.   On: 12/07/2017 10:01   Ct Chest Wo Contrast  Result Date: 12/06/2017 CLINICAL DATA:  Chest wall hematoma. EXAM: CT CHEST WITHOUT CONTRAST TECHNIQUE: Multidetector CT imaging of the chest was performed following the standard protocol without IV contrast. COMPARISON:  Two-view chest x-ray 12/01/2017. FINDINGS: Cardiovascular: The heart size is normal. Atherosclerotic calcifications are present within the coronary arteries. Valvular calcifications are present at the mitral and anti aortic valves. Atherosclerotic calcifications are present at the aortic arch. A right-sided PICC line is in place. Pulmonary arteries are unremarkable. Mediastinum/Nodes: No significant mediastinal or axillary adenopathy is present. Lungs/Pleura: Large bilateral pleural effusions are present. There is associated atelectasis. No consolidated airspace disease is present. Upper Abdomen: Moderate abdominal ascites present. The gallbladder is distended. Layering gallstones are evident. Vascular calcifications are noted. Musculoskeletal: A left subpectoral chest wall hematoma measures 14 x 12 x 5 cm. Surrounding inflammatory changes are evident. No other soft tissue hemorrhage is present. IMPRESSION: 1. Large chest wall hematoma anteriorly on the left measures 14 x 12 x 5 cm. This appears to be subpectoral. 2. Large bilateral pleural effusions and associated atelectasis. 3. Abdominal ascites. 4.  The combination suggests both right and left-sided heart failure. 5. Cholelithiasis without evidence for cholecystitis. 6. Extensive  atherosclerotic disease including coronary artery disease. 7.  Aortic Atherosclerosis (ICD10-I70.0). Electronically Signed   By: San Morelle M.D.   On: 12/06/2017 18:00   Dg Chest Port 1 View  Result Date: 11/28/2017 CLINICAL DATA:  Evaluate for PICC line placement EXAM: PORTABLE CHEST 1 VIEW COMPARISON:  11/22/2017 FINDINGS: Right PICC tip projects in the lower superior vena cava at the level of the caval atrial junction. There is lung base opacity that has increased when compared to the prior study, more notable on the right. This is likely a combination of small effusions with either atelectasis or infiltrate. There is no pulmonary edema. Heart is top-normal in size.  No mediastinal or hilar masses. No pneumothorax. IMPRESSION: 1. Right PICC tip projects in the lower superior vena cava at the level of the caval atrial junction. 2. Increased lung base opacity since prior study more notable on the right. This is likely combination of small effusions with atelectasis or pneumonia. No evidence of pulmonary edema. Electronically Signed   By: Lajean Manes M.D.   On: 11/28/2017 18:22      Subjective:   Discharge Exam: Vitals:   12/08/17 2027 12/09/17 0611  BP: (!) 95/52 (!) 87/45  Pulse: 97 (!) 101  Resp: 16 16  Temp: 97.8 F (36.6 C) 97.8 F (36.6 C)  SpO2: 100% 94%   Vitals:   12/08/17 1155 12/08/17 1225 12/08/17 2027 12/09/17 0611  BP: (!) 69/30 (!) 72/40 (!) 95/52 (!) 87/45  Pulse:   97 (!) 101  Resp: 17 15 16 16   Temp:   97.8 F (36.6 C) 97.8 F (36.6 C)  TempSrc:   Oral Oral  SpO2: 100% 100% 100% 94%  Weight:      Height:        General: Pt is alert, awake, not in acute distress Cardiovascular: RRR, S1/S2 +, no rubs, no gallops Respiratory: CTA bilaterally, no wheezing, no rhonchi Abdominal: Soft, NT, ND, bowel sounds + Extremities: no edema, no cyanosis    The results of significant diagnostics from this hospitalization (including imaging, microbiology,  ancillary and laboratory) are listed below for reference.     Microbiology: Recent Results (from the past 240 hour(s))  Culture, blood (routine x 2)     Status: None   Collection Time: 12/01/17  7:30 AM  Result Value Ref Range Status   Specimen Description BLOOD LEFT ANTECUBITAL  Final   Special Requests IN PEDIATRIC BOTTLE Blood Culture adequate volume  Final   Culture   Final    NO GROWTH 5 DAYS Performed at [facility], [address] [place], [place] [address]    Report Status 12/06/2017 FINAL  Final  C difficile quick scan w PCR reflex     Status: Abnormal   Collection Time: 12/01/17  8:15 AM  Result Value Ref Range Status   C Diff antigen POSITIVE (A) NEGATIVE Final   C Diff toxin POSITIVE (A) NEGATIVE Final   C Diff interpretation Toxin producing C. difficile detected.  Final    Comment: CRITICAL RESULT CALLED TO, READ BACK BY AND VERIFIED WITH: Donetta Potts RN 10:20 12/01/17 ([email or web address])   Urine Culture     Status: None   Collection Time: 12/01/17 10:22 AM  Result Value Ref Range Status   Specimen Description URINE, CLEAN CATCH  Final   Special Requests NONE  Final   Culture   Final  NO GROWTH Performed at [facility], [place] [address], [place], [place] [address]    Report Status 12/02/2017 FINAL  Final  Culture, blood (routine x 2)     Status: None   Collection Time: 12/01/17 11:40 AM  Result Value Ref Range Status   Specimen Description BLOOD LEFT ANTECUBITAL  Final   Special Requests   Final    BOTTLES DRAWN AEROBIC ONLY Blood Culture adequate volume   Culture   Final    NO GROWTH 5 DAYS Performed at [facility], [place] [address], [place], [place] [address]    Report Status 12/06/2017 FINAL  Final  Surgical pcr screen     Status: None   Collection Time: 12/06/17  5:41 AM  Result Value Ref Range Status   MRSA, PCR NEGATIVE NEGATIVE Final   Staphylococcus aureus NEGATIVE NEGATIVE Final    Comment: (NOTE) The Xpert SA Assay (FDA approved for  NASAL specimens in patients 23 years of age and older), is one component of a comprehensive surveillance program. It is not intended to diagnose infection nor to guide or monitor treatment. Performed at [facility], [place] [address], [place], [place] [address]      Labs: BNP (last 3 results) No results for input(s): BNP in the last 8760 hours. Basic Metabolic Panel: Recent Labs  Lab 12/03/17 0435 12/06/17 0629 12/07/17 0203 12/08/17 0433  NA 134* 136 136 137  K 3.4* 3.7 4.1 4.0  CL 108 112* 114* 114*  CO2 19* 19* 16* 17*  GLUCOSE 103* 106* 147* 105*  BUN 34* 25* 32* 42*  CREATININE 1.07 1.04 1.50* 1.65*  CALCIUM 7.9* 7.8* 7.6* 7.8*   Liver Function Tests: No results for input(s): AST, ALT, ALKPHOS, BILITOT, PROT, ALBUMIN in the last 168 hours. No results for input(s): LIPASE, AMYLASE in the last 168 hours. No results for input(s): AMMONIA in the last 168 hours. CBC: Recent Labs  Lab 12/04/17 0434 12/05/17 0302 12/06/17 0629 12/06/17 1655 12/07/17 0203 12/08/17 0433  WBC 10.8* 9.8 9.0  --  13.2* 9.2  HGB 9.4* 10.1* 8.6* 6.6* 8.6* 6.8*  HCT 27.8* 29.6* 25.1* 18.9* 24.7* 19.4*  MCV 95.5 95.2 95.4  --  88.8 89.4  PLT 136* 156 154  --  125* 107*   Cardiac Enzymes: No results for input(s): CKTOTAL, CKMB, CKMBINDEX, TROPONINI in the last 168 hours. BNP: Invalid input(s): POCBNP CBG: No results for input(s): GLUCAP in the last 168 hours. D-Dimer No results for input(s): DDIMER in the last 72 hours. Hgb A1c No results for input(s): HGBA1C in the last 72 hours. Lipid Profile No results for input(s): CHOL, HDL, LDLCALC, TRIG, CHOLHDL, LDLDIRECT in the last 72 hours. Thyroid function studies No results for input(s): TSH, T4TOTAL, T3FREE, THYROIDAB in the last 72 hours.  Invalid input(s): FREET3 Anemia work up No results for input(s): VITAMINB12, FOLATE, FERRITIN, TIBC, IRON, RETICCTPCT in the last 72 hours. Urinalysis    Component Value Date/Time    COLORURINE AMBER (A) 12/01/2017 0825   APPEARANCEUR CLOUDY (A) 12/01/2017 0825   LABSPEC 1.023 12/01/2017 0825   PHURINE 5.0 12/01/2017 0825   GLUCOSEU NEGATIVE 12/01/2017 0825   HGBUR SMALL (A) 12/01/2017 0825   BILIRUBINUR NEGATIVE 12/01/2017 0825   KETONESUR 5 (A) 12/01/2017 0825   PROTEINUR 100 (A) 12/01/2017 0825   NITRITE NEGATIVE 12/01/2017 0825   LEUKOCYTESUR TRACE (A) 12/01/2017 0825   Sepsis Labs Invalid input(s): PROCALCITONIN,  WBC,  LACTICIDVEN Microbiology Recent Results (from the past  240 hour(s))  Culture, blood (routine x 2)     Status: None   Collection Time: 12/01/17  7:30 AM  Result Value Ref Range Status   Specimen Description BLOOD LEFT ANTECUBITAL  Final   Special Requests IN PEDIATRIC BOTTLE Blood Culture adequate volume  Final   Culture   Final    NO GROWTH 5 DAYS Performed at [facility], [place] [address], [place], [place] [address]    Report Status 12/06/2017 FINAL  Final  C difficile quick scan w PCR reflex     Status: Abnormal   Collection Time: 12/01/17  8:15 AM  Result Value Ref Range Status   C Diff antigen POSITIVE (A) NEGATIVE Final   C Diff toxin POSITIVE (A) NEGATIVE Final   C Diff interpretation Toxin producing C. difficile detected.  Final    Comment: CRITICAL RESULT CALLED TO, READ BACK BY AND VERIFIED WITH: Donetta Potts RN 10:20 12/01/17 ([email or web address])   Urine Culture     Status: None   Collection Time: 12/01/17 10:22 AM  Result Value Ref Range Status   Specimen Description URINE, CLEAN CATCH  Final   Special Requests NONE  Final   Culture   Final    NO GROWTH Performed at [facility], [address] [place], [place] [address]    Report Status 12/02/2017 FINAL  Final  Culture, blood (routine x 2)     Status: None   Collection Time: 12/01/17 11:40 AM  Result Value Ref Range Status   Specimen Description BLOOD LEFT ANTECUBITAL  Final   Special Requests   Final    BOTTLES DRAWN AEROBIC ONLY Blood Culture adequate volume    Culture   Final    NO GROWTH 5 DAYS Performed at [facility], [place] [address], [place], [place] [address]    Report Status 12/06/2017 FINAL  Final  Surgical pcr screen     Status: None   Collection Time: 12/06/17  5:41 AM  Result Value Ref Range Status   MRSA, PCR NEGATIVE NEGATIVE Final   Staphylococcus aureus NEGATIVE NEGATIVE Final    Comment: (NOTE) The Xpert SA Assay (FDA approved for NASAL specimens in patients 19 years of age and older), is one component of a comprehensive surveillance program. It is not intended to diagnose infection nor to guide or monitor treatment. Performed at [facility], [place] [address], [place], [place] [address]      Time coordinating discharge: Over 30 minutes  SIGNED:   Rodena Goldmann, DO [facility] 12/09/2017, 2:28 PM Pager [phone]  If 7PM-7AM, please contact night-coverage [email or web address] Password [email or web address]

## 2017-12-09 NOTE — Care Management Note (Signed)
Case Management Note  Patient Details  Name: Peter Moreno MRN: [id number] Date of Birth: 11/19/1926  Subjective/Objective:        Streptococcal bacteremia             Kara Mead (Other) Alegandro Macnaughton [phone]      Action/Plan: Transition to home with home hospice care.  [facility] will need to be called to arrange transportation services to home once equipment (hospital bed) has been delivered to home. Scheduled delivery time is between 5pm-9pm.   Expected Discharge Date:  12/09/17               Expected Discharge Plan:  Home w Hospice Care  In-House Referral:  Clinical Social Work  Discharge planning Services  CM Consult  Post Acute Care Choice:  NA Choice offered to:  NA  DME Arranged:  N/A DME Agency:  NA  HH Arranged:  NA HH Agency:  [facility]  Status of Service:  Completed, signed off  If discussed at [employer], dates discussed:    Additional Comments:  Sharin Mons, RN 12/09/2017, 3:28 PM

## 2017-12-09 NOTE — Consult Note (Signed)
Consultation Note Date: 12/09/2017   Patient Name: Peter Moreno  DOB: Dec 05, 1926  MRN: [id number]  Age / Sex: 84 y.o., male  PCP: Cyndy Freeze, MD Referring Physician: Rodena Goldmann, DO  Reason for Consultation: Establishing goals of care and Psychosocial/spiritual support  HPI/Patient Profile: 84 y.o. male  with past medical history of Atrial Fibrillation with Coumadin usage; Dementia, Gout and Hypertension admitted on 11/22/2017 with Dysuria, Urinary retention and Sepsis secondary to dental carries.   He reports continued physical, functional and cognitive decline over the past several months.  Goal for life trajectory is comfort and dignity.  Family face advance directive decisions, treatment option decisions and anticipatory care needs.  Clinical Assessment and Goals of Care:   This NP Wadie Lessen and Charlann Boxer, NP student  reviewed medical records, received report from team, assessed the patient and then meet at the patient's bedside along with patient's son Nicki Reaper, daughter in-law [place] and grandsons [place] and [place]  to discuss diagnosis, prognosis, GOC, EOL wishes disposition and options.  Concept of Hospice and Palliative Care were discussed. Family wishes for patient to return home with Hospice care; they prefer [facility] as they have had a positive experience with other family members in the past.  A detailed discussion was had today regarding advanced directives.  Concepts specific to code status, artifical feeding and hydration, continued IV antibiotics and rehospitalization was had.  The difference between a aggressive medical intervention path  and a palliative comfort care path for this patient at this time was had.  Values and goals of care important to patient and family were attempted to be elicited.   Family expresses their goal is that patient is comfortable and they do not  want any life sustaining measures implemented.   Natural trajectory and expectations at EOL were discussed.  Questions and concerns addressed.   Family encouraged to call with questions or concerns.    PMT will continue to support holistically.     HCPOA  Nicki Reaper is the Media planner.   SUMMARY OF RECOMMENDATIONS    Patient will remain a DNR; no life sustaining measures. Family would like a full comfort path and for patient to return home with Hospice, today if possible. Family is requesting [facility].  Code Status/Advance Care Planning:  DNR    Symptom Management:   Pain and anxiety management  Palliative Prophylaxis:   Aspiration, Bowel Regimen, Delirium Protocol and Oral Care  Additional Recommendations (Limitations, Scope, Preferences):  Full Comfort Care  Psycho-social/Spiritual:   Desire for further Chaplaincy support:yes  Additional Recommendations: Education on Hospice  Prognosis:   < 3 months  Discharge Planning: Home with Hospice      Primary Diagnoses: Present on Admission: . Atrial fibrillation with RVR ([place]) . Alzheimer's dementia . UTI (urinary tract infection) . Sepsis ([place]) . Hypotension . Hyponatremia . Essential hypertension   I have reviewed the medical record, interviewed the patient and family, and examined the patient. The following aspects are pertinent.  Past Medical History:  Diagnosis Date  .  Atrial fibrillation ([place])   . Hypertension   . Skin cancer    Social History   Socioeconomic History  . Marital status: Widowed    Spouse name: None  . Number of children: 3  . Years of education: None  . Highest education level: None  Social Needs  . Financial resource strain: None  . Food insecurity - worry: None  . Food insecurity - inability: None  . Transportation needs - medical: None  . Transportation needs - non-medical: None  Occupational History  . None  Tobacco Use  . Smoking status: Former Smoker     Packs/day: 3.00    Types: Cigarettes    Start date: 06/22/1949    Last attempt to quit: 06/22/1969    Years since quitting: 48.4  . Smokeless tobacco: Never Used  Substance and Sexual Activity  . Alcohol use: Yes    Comment: Occasional  . Drug use: None  . Sexual activity: None  Other Topics Concern  . None  Social History Narrative  . None   Family History  Problem Relation Age of Onset  . Dementia Mother   . Heart disease Unknown        No family history   Scheduled Meds: . chlorhexidine  15 mL Mouth Rinse BID  . erythromycin  1 application Both Eyes Daily  . midodrine  10 mg Oral TID WC   Continuous Infusions: PRN Meds:.acetaminophen **OR** acetaminophen, LORazepam, morphine CONCENTRATE Medications Prior to Admission:  Prior to Admission medications   Medication Sig Start Date End Date Taking? Authorizing Provider  allopurinol (ZYLOPRIM) 300 MG tablet Take 300 mg by mouth daily.   Yes [provider]  Cholecalciferol (VITAMIN D-3) 1000 units CAPS Take 1,000 Units by mouth daily.   Yes [provider]  diltiazem (CARDIZEM CD) 120 MG 24 hr capsule Take 1 capsule (120 mg total) by mouth daily. Schedule appointment for further refills 10/08/17  Yes Crenshaw, Denice Bors, MD  donepezil (ARICEPT) 10 MG tablet Take 1 tablet (10 mg total) by mouth at bedtime. 09/29/17  Yes Melvenia Beam, MD  erythromycin ophthalmic ointment Place 1 application into both eyes daily.   Yes [provider]  linaclotide (LINZESS) 290 MCG CAPS capsule Take 290 mcg by mouth daily before breakfast.   Yes [provider]  midodrine (PROAMATINE) 2.5 MG tablet Take 5 mg by mouth 3 (three) times daily. 11/01/17  Yes [provider]  Multiple Vitamins-Minerals (CENTRUM SILVER 50+MEN) TABS Take 1 tablet by mouth daily with breakfast.   Yes [provider]  Polyethylene Glycol 3350 (MIRALAX PO) Take 20 g by mouth daily.    Yes [provider]  warfarin  (COUMADIN) 5 MG tablet TAKE ONE TO ONE AND ONE-HALF TABLETS DAILY AS DIRECTED BY COUMADIN CLINIC Patient taking differently: Take 7.5 mg by mouth in the evening on Sun/Tues/Thurs/Sat and 5 mg on Mon/Wed/Fri 06/10/17  Yes Crenshaw, Denice Bors, MD  benazepril (LOTENSIN) 5 MG tablet TAKE 1 TABLET DAILY Patient not taking: Reported on 11/22/2017 03/30/17   Lelon Perla, MD   Allergies  Allergen Reactions  . Adhesive [Tape] Other (See Comments)    THE PATIENT'S SKIN IS VERY THIN AND WILL TEAR AND BRUISE VERY EASILY!!  . Penicillins Rash and Other (See Comments)    PATIENT HAS HAD A PCN REACTION WITH IMMEDIATE RASH, FACIAL/TONGUE/THROAT SWELLING, SOB, OR LIGHTHEADEDNESS WITH HYPOTENSION:  #  #  #  YES  #  #  #   Has  patient had a PCN reaction causing severe rash involving mucus membranes or skin necrosis: Unk Has patient had a PCN reaction that required hospitalization: No Has patient had a PCN reaction occurring within the last 10 years: No   . Sunflower Oil Itching   Review of Systems  Constitutional: Positive for appetite change and fatigue.  HENT: Positive for dental problem and hearing loss.   Genitourinary: Positive for difficulty urinating, dysuria, frequency and urgency.  Neurological: Positive for weakness.    Physical Exam  Constitutional:  Frail in appearance  Cardiovascular: Tachycardia present.  Pulmonary/Chest: Effort normal.  Neurological:  Patient asleep throughout visit.  Skin: Skin is warm and dry.  Vitals reviewed.   Vital Signs: BP (!) 87/45 (BP Location: Left Arm)   Pulse (!) 101   Temp 97.8 F (36.6 C) (Oral)   Resp 16   Ht 5\' 6"  (1.676 m)   Wt 67.2 kg (148 lb 2.4 oz)   SpO2 94%   BMI 23.91 kg/m  Pain Assessment: No/denies pain   Pain Score: 0-No pain   SpO2: SpO2: 94 % O2 Device:SpO2: 94 % O2 Flow Rate: .O2 Flow Rate (L/min): 2 L/min  IO: Intake/output summary: No intake or output data in the 24 hours ending 12/09/17 1349  LBM: Last BM Date:  12/07/17 Baseline Weight: Weight: 62.1 kg (137 lb) Most recent weight: Weight: 67.2 kg (148 lb 2.4 oz)     Palliative Assessment/Data: 30%   Discussed with Dr Manuella Ghazi and CMRN  Time In: 1300pm  Time Out: 1415pm Time Total: 75 min minutes Greater than 50%  of this time was spent counseling and coordinating care related to the above assessment and plan.  Signed by: Wadie Lessen, NP   Please contact Palliative Medicine Team phone at [phone] for questions and concerns.  For individual provider: See Amion  Discussed with Case Manager

## 2017-12-09 NOTE — Progress Notes (Signed)
CSW notes patient is transitioning to comfort care. CSW will sign off. Please re-consult if need arises.   Percell Locus Amyria Komar LCSW [phone]

## 2017-12-10 LAB — BPAM RBC
BLOOD PRODUCT EXPIRATION DATE: 201904012359
BLOOD PRODUCT EXPIRATION DATE: 201904022359
Blood Product Expiration Date: 201903272359
Blood Product Expiration Date: 201904022359
ISSUE DATE / TIME: 201903041704
ISSUE DATE / TIME: 201903042232
ISSUE DATE / TIME: 201903060649
UNIT TYPE AND RH: 5100
Unit Type and Rh: 5100
Unit Type and Rh: 5100
Unit Type and Rh: 5100

## 2017-12-10 LAB — TYPE AND SCREEN
ABO/RH(D): O POS
Antibody Screen: NEGATIVE
Unit division: 0
Unit division: 0
Unit division: 0
Unit division: 0

## 2017-12-17 ENCOUNTER — Telehealth: Payer: Self-pay | Admitting: *Deleted

## 2017-12-17 NOTE — Telephone Encounter (Signed)
Pateint's daughter called to report that the pt was hospitalized from 11/22/17-12/09/17 and since discharge the pt's Coumadin has been discharged and he is on comfort measures at home and Hospice has been called in. Expressed sympathy and advised to call us if anything is needed during this time and she stated they would and she appreciated the call. Will discontinue the Anticoagulation Track at this time.

## 2017-12-22 ENCOUNTER — Inpatient Hospital Stay: Payer: Medicare Other | Admitting: Internal Medicine

## 2018-01-03 DEATH — deceased

## 2018-03-14 ENCOUNTER — Telehealth: Payer: Self-pay | Admitting: Cardiology

## 2018-03-14 NOTE — Telephone Encounter (Signed)
New Message:      She just wanted you to know that the patient passed away.

## 2018-03-14 NOTE — Telephone Encounter (Signed)
Spoke with pt son, condolences given. They will drop a copy of the death certificate by the office to close out his chart.

## 2018-03-28 ENCOUNTER — Telehealth: Payer: Self-pay | Admitting: Neurology

## 2018-03-28 NOTE — Telephone Encounter (Signed)
Pt's daughter-in-law Jordan Hawks) advised the patient passed 12/26/2017  [facility]

## 2018-03-31 ENCOUNTER — Ambulatory Visit: Payer: Medicare Other | Admitting: Neurology

## 2018-07-28 IMAGING — DX DG CHEST 2V
1 series · 1 of 1 positions shown · non-contrast
Comparison: 10/31/2017

CLINICAL DATA: Sepsis

EXAM:
CHEST  2 VIEW

[w chest lat]
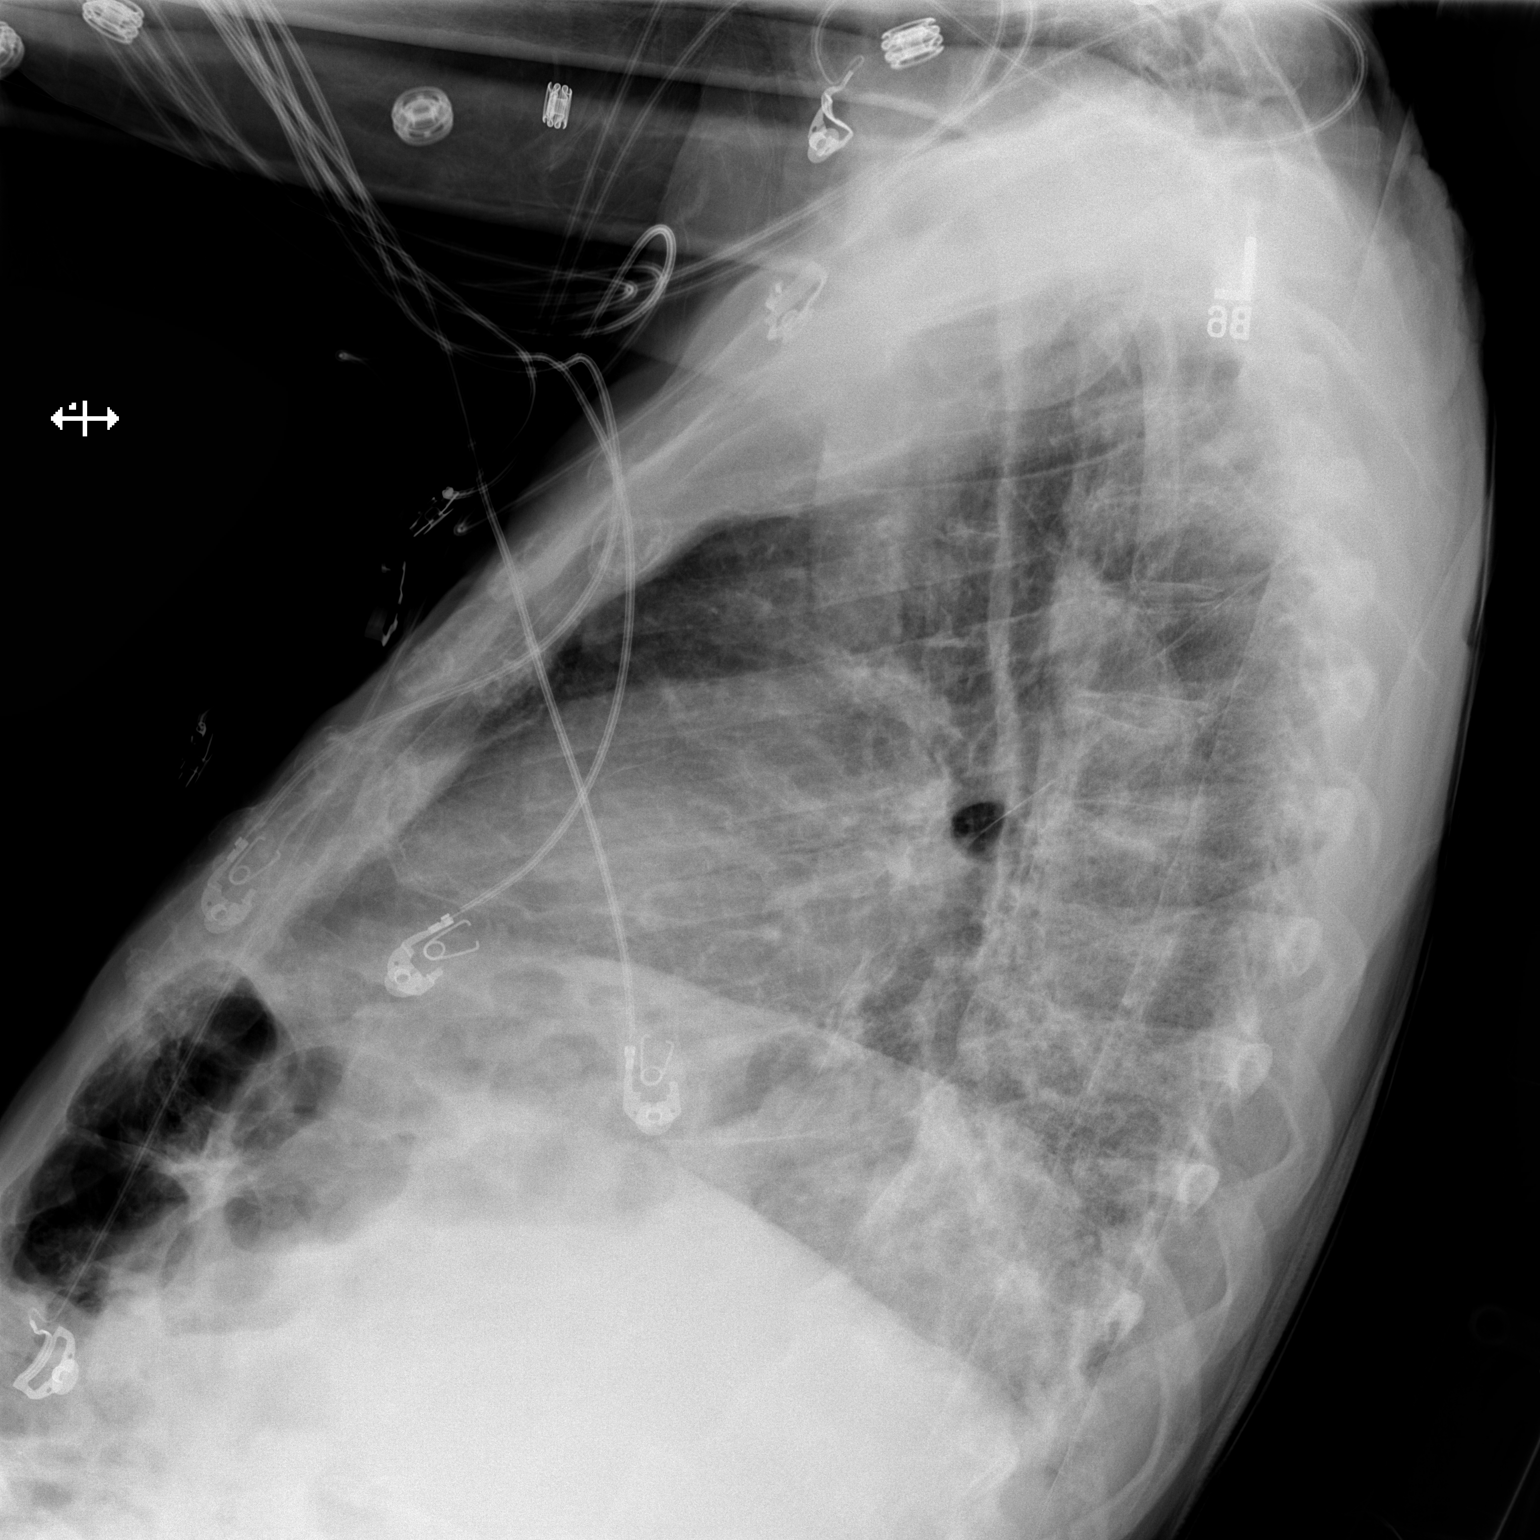

[1 of 1 positions shown; findings below may reference images not displayed]

FINDINGS: The heart is mildly enlarged but stable. There is moderate
tortuosity and calcification of the thoracic aorta. Chronic
bronchitic and emphysematous lung changes but no definite
infiltrates, edema or effusions. The bony thorax is intact.
IMPRESSION: Mild stable cardiac enlargement.

Chronic emphysematous and bronchitic lung changes without acute
overlying pulmonary process.

## 2018-08-01 IMAGING — DX DG ORTHOPANTOGRAM /PANORAMIC
2 series · 2 of 2 positions shown · non-contrast
Comparison: None.

CLINICAL DATA: Bacteremia.

EXAM:
ORTHOPANTOGRAM/PANORAMIC

[view not recorded (1 of 2)]
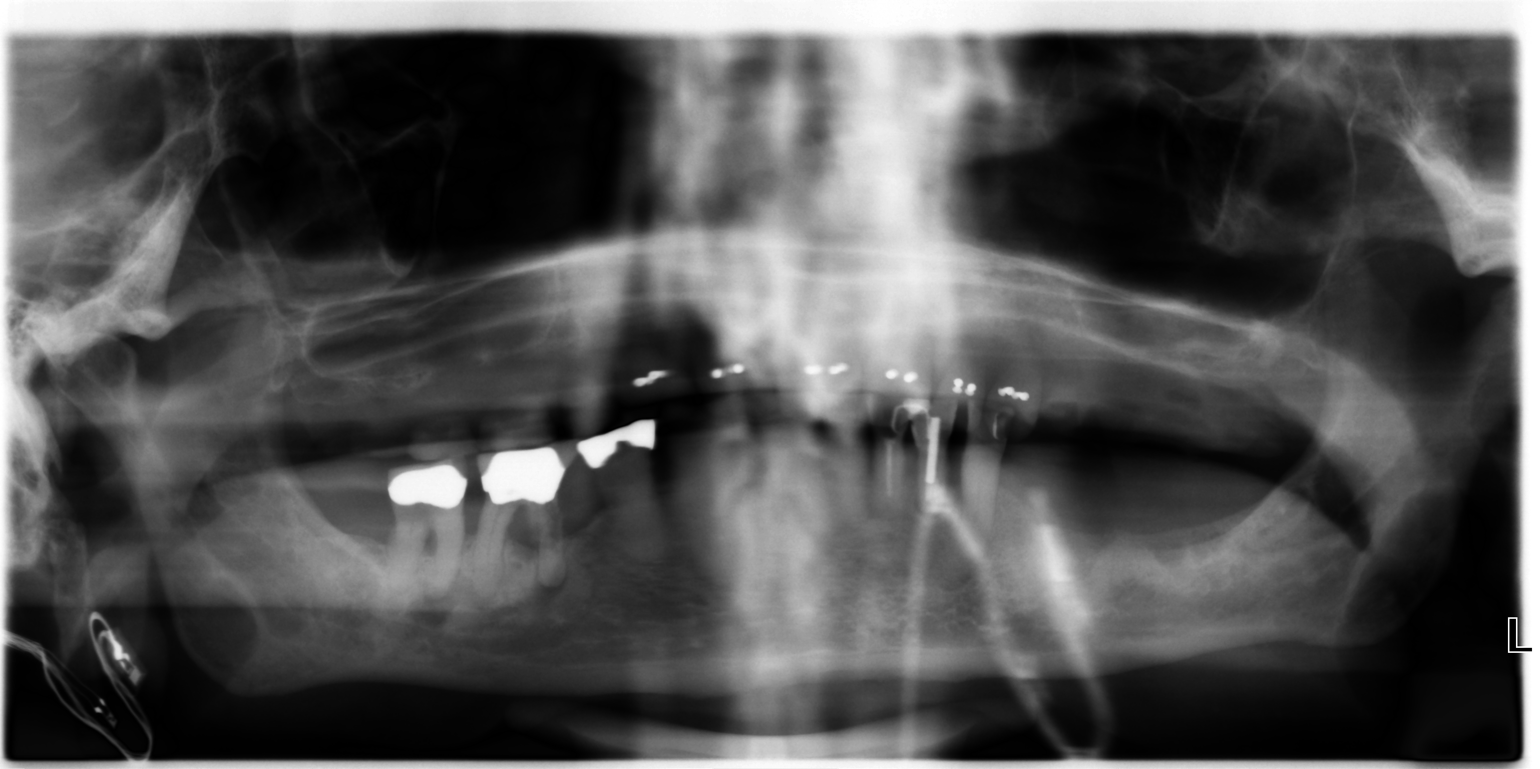

[view not recorded (2 of 2)]
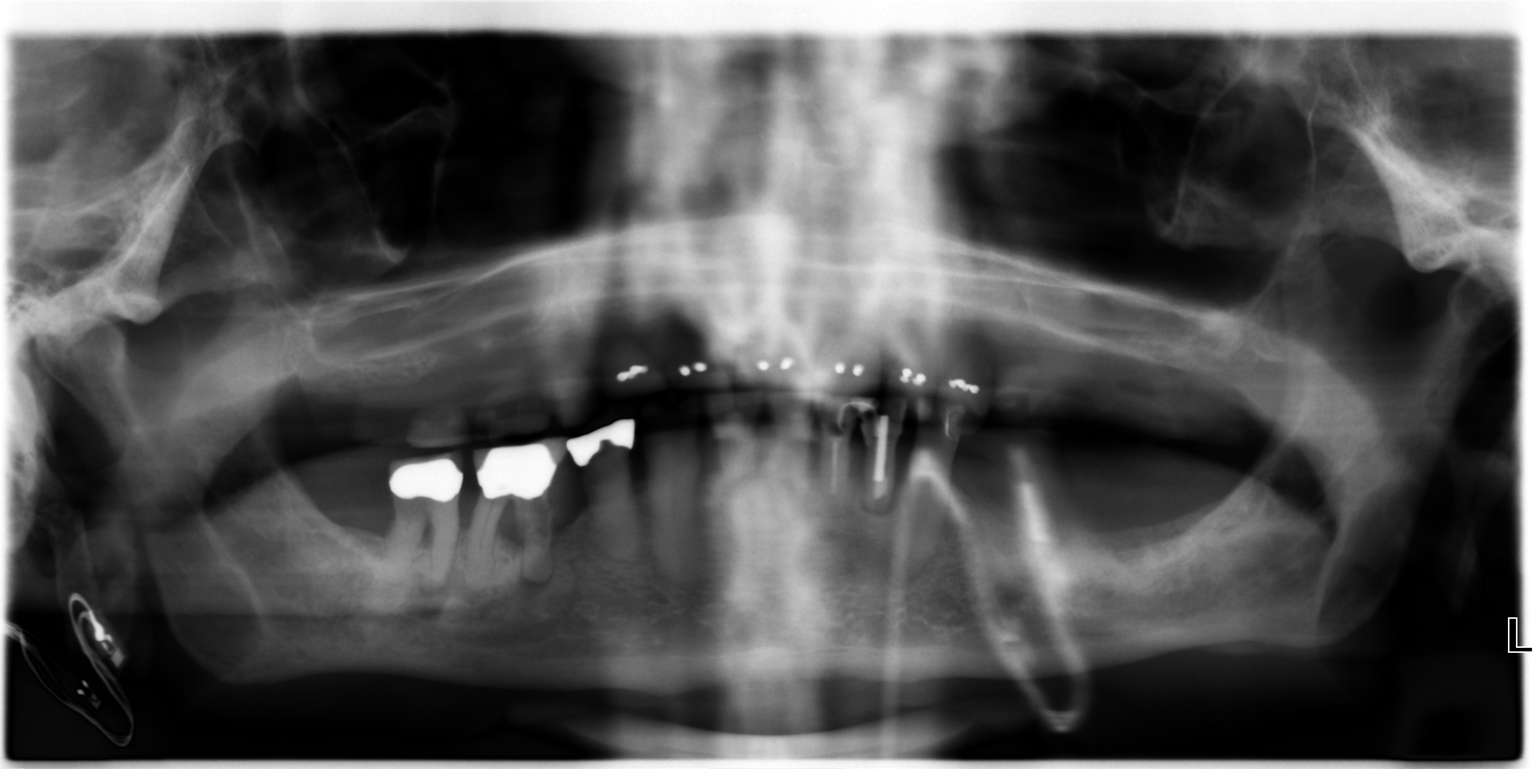

[2 of 2 positions shown; findings below may reference images not displayed]

FINDINGS: Periapical lucency is seen involving a left mandibular premolar and
remaining right mandibular molars.
IMPRESSION: Periapical lucency involving both remain right mandibular molars and
left mandibular premolar. Consider dental referral and dedicated
dental radiographs for further evaluation.

## 2018-08-12 IMAGING — CT CT CHEST W/O CM
2 of 3 series · 15 of 36 positions shown, 18 images · non-contrast
Comparison: 12/06/2017

CLINICAL DATA: Follow-up left chest wall hematoma, shortness of
breath and weakness

EXAM:
CT CHEST WITHOUT CONTRAST
TECHNIQUE: Multidetector CT imaging of the chest was performed following the
standard protocol without IV contrast.

[Series 4: thorax 2.0 · axial · 0.81mm/px · z∈[+1174,+1452]mm · 12 of 165 slices shown, 15 images]
[im 13/165  mediastinal]
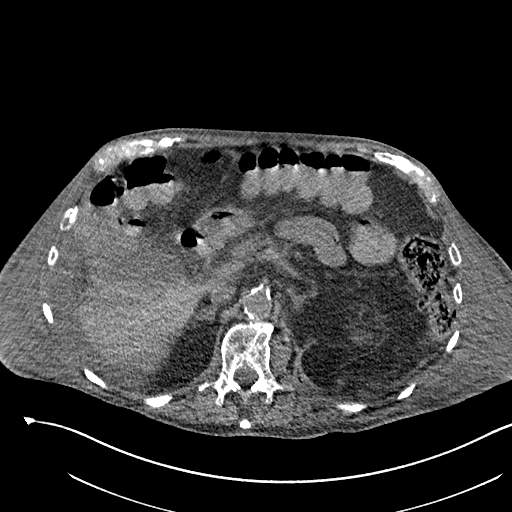
[im 13/165  lung]
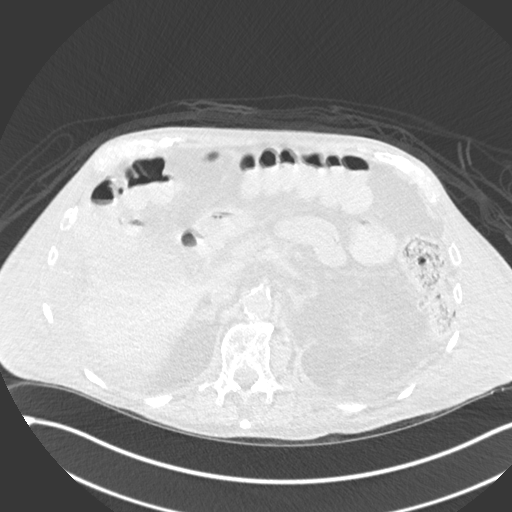
[im 25/165  lung]
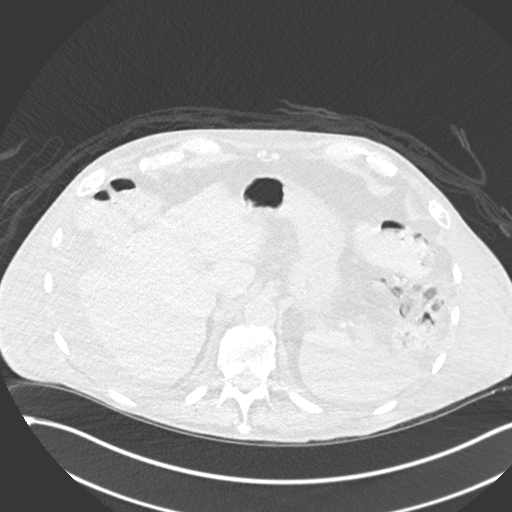
[im 37/165  lung]
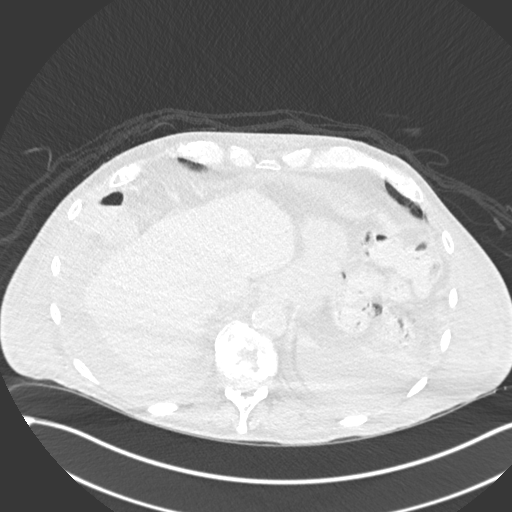
[im 49/165  lung]
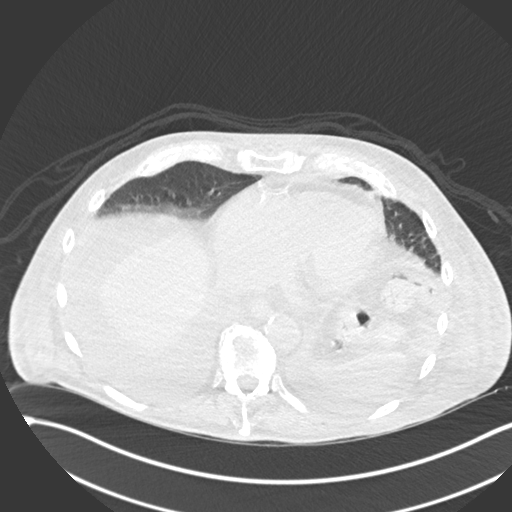
[im 61/165  mediastinal]
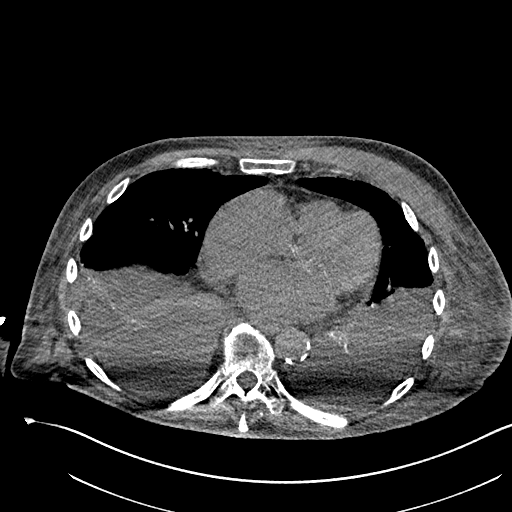
[im 61/165  lung]
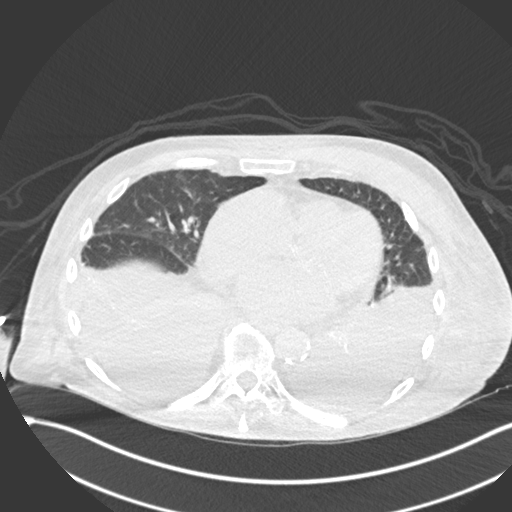
[im 73/165  lung]
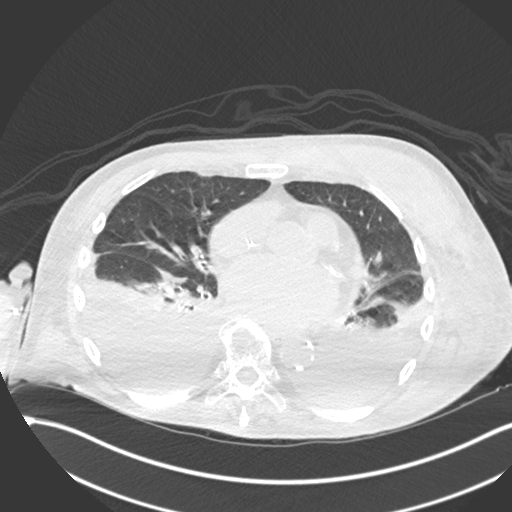
[im 92/165  lung]
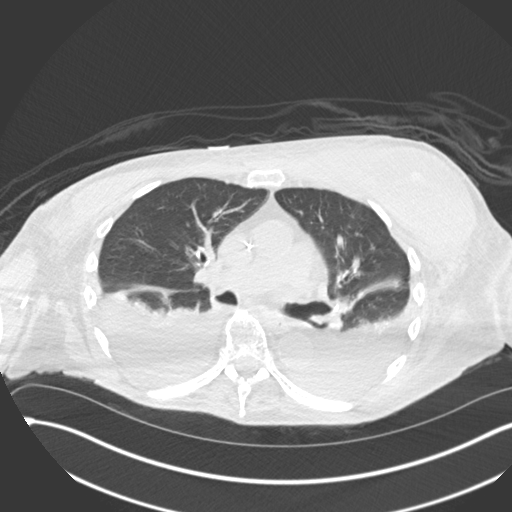
[im 104/165  lung]
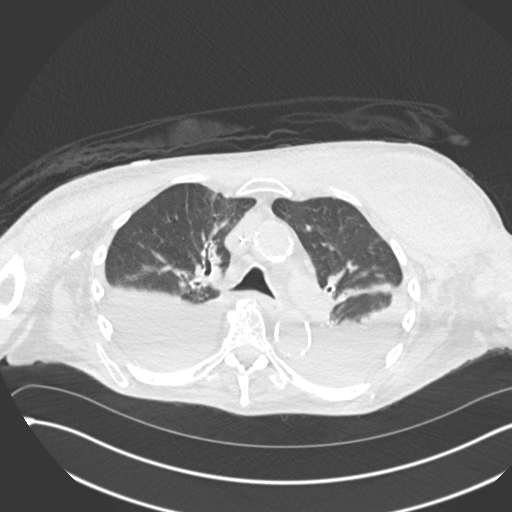
[im 116/165  mediastinal]
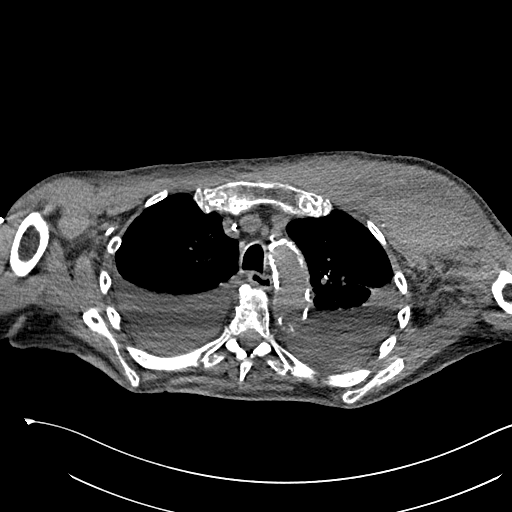
[im 116/165  lung]
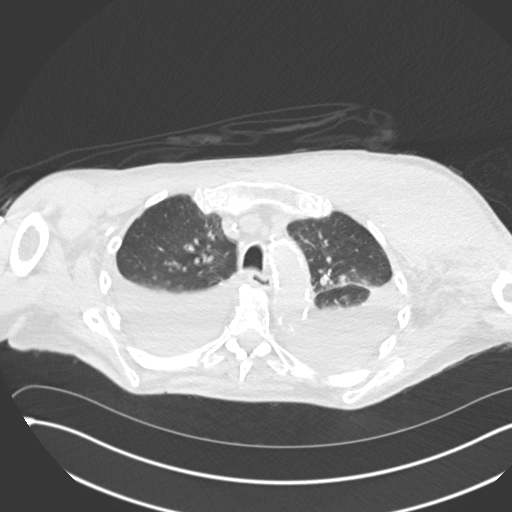
[im 128/165  lung]
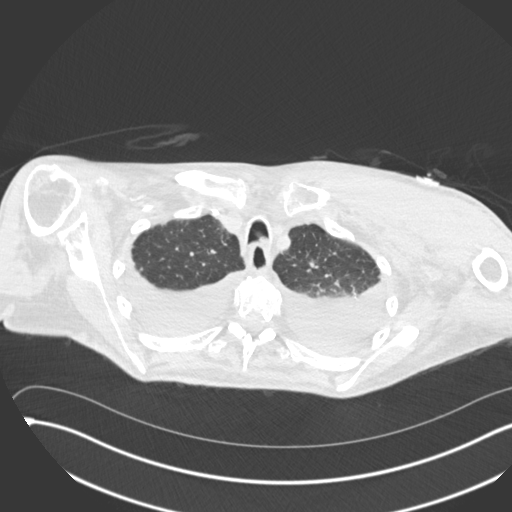
[im 140/165  lung]
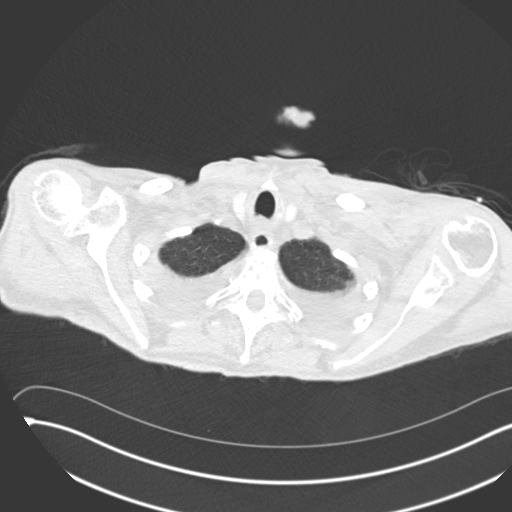
[im 152/165  lung]
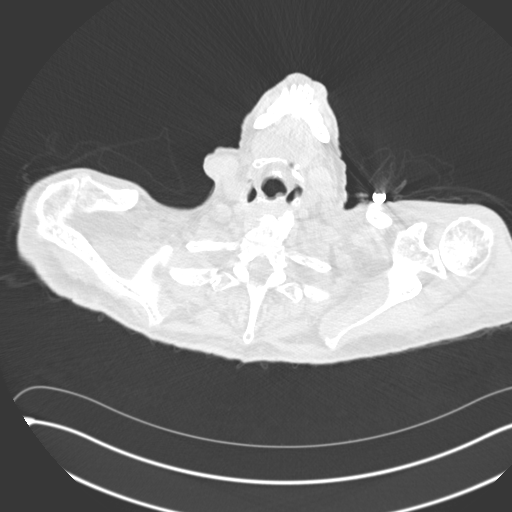

[Series 6: coronal · coronal · 0.64mm/px · 3 of 85 slices shown]
[im 17/85  lung]
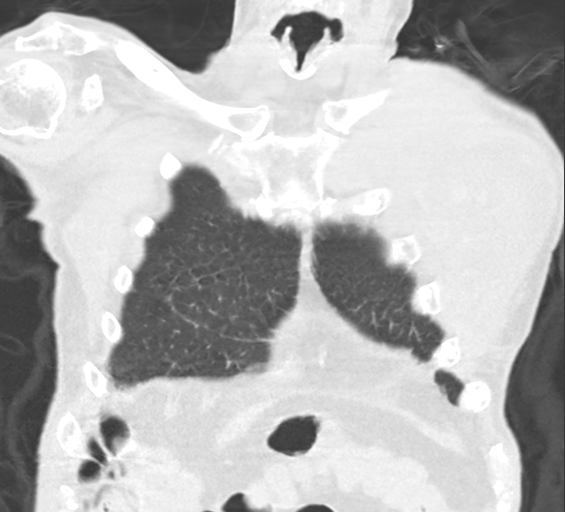
[im 34/85  lung]
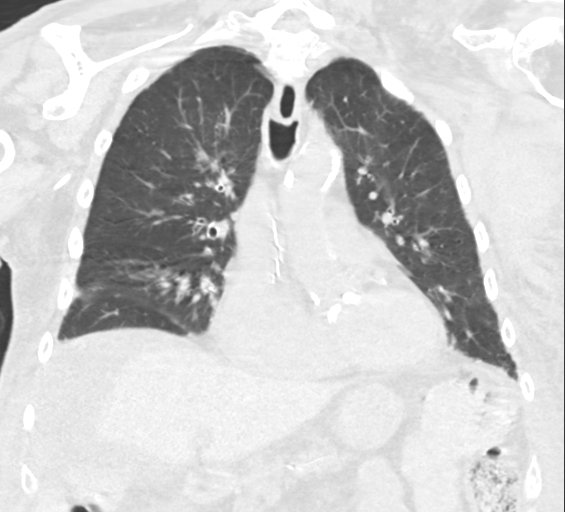
[im 51/85  lung]
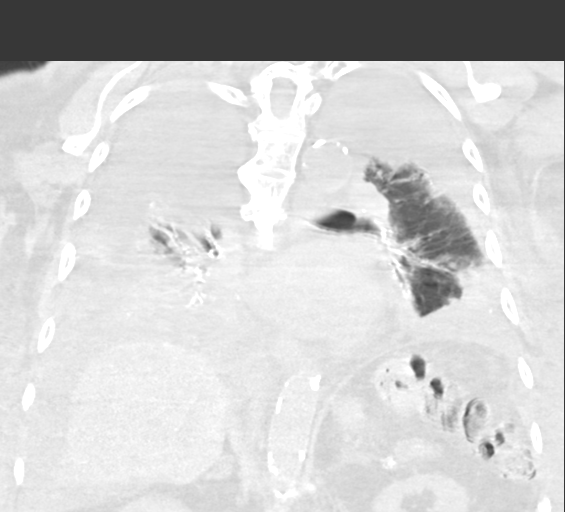

[15 of 36 positions shown; findings below may reference images not displayed]

FINDINGS: Cardiovascular: Thoracic aorta and its branches demonstrate diffuse
vascular calcifications. No aneurysmal dilatation is seen. No
significant cardiac enlargement is noted. Coronary calcifications
are seen. Right-sided PICC line is again identified and stable.

Mediastinum/Nodes: Thoracic inlet is within normal limits. No
significant hilar or mediastinal adenopathy is noted. The esophagus
is within normal limits.

Lungs/Pleura: Large bilateral pleural effusions are again
identified. Some associated atelectatic changes are noted
particularly in the lower lobes bilaterally. Scattered calcified
granulomas are seen. No focal mass lesion is noted.

Upper Abdomen: Ascites is again identified surrounding the liver
stable from the prior exam. The previously noted gallstones are less
well appreciated on the current study. No new focal abnormality is
noted within the liver.

Musculoskeletal: There is again noted a large left anterior chest
wall hematoma. This measures approximately 12 x 6.5 cm in transverse
and AP dimensions and extends for at least 12 cm in the craniocaudad
projection. It demonstrates increased liquefaction with a
fluid-fluid level within. The overall appearance is relatively
stable when compared with the prior exam with the exception of the
breakdown of blood products within. Degenerative changes of the
thoracic spine are again seen and stable.
IMPRESSION: Persistent and relatively stable left chest wall hematoma as
described. There has been some increase in the breakdown of the
blood products within the collection.

Stable large bilateral pleural effusions with associated atelectatic
changes.

Changes of prior granulomatous disease.

Ascites within the abdomen

Aortic Atherosclerosis ([id number]).
